# Patient Record
Sex: Male | Born: 1935 | Race: White | Hispanic: No | Marital: Married | Smoking: Never smoker
Health system: Southern US, Community
[De-identification: ages and names within clinical notes are randomized; demographics above are authoritative.]

## PROBLEM LIST (undated history)

## (undated) DIAGNOSIS — I3139 Other pericardial effusion (noninflammatory): Secondary | ICD-10-CM

## (undated) DIAGNOSIS — I313 Pericardial effusion (noninflammatory): Secondary | ICD-10-CM

## (undated) DIAGNOSIS — J45909 Unspecified asthma, uncomplicated: Secondary | ICD-10-CM

## (undated) DIAGNOSIS — R188 Other ascites: Secondary | ICD-10-CM

## (undated) DIAGNOSIS — I5032 Chronic diastolic (congestive) heart failure: Secondary | ICD-10-CM

## (undated) DIAGNOSIS — G2581 Restless legs syndrome: Secondary | ICD-10-CM

## (undated) DIAGNOSIS — G4733 Obstructive sleep apnea (adult) (pediatric): Secondary | ICD-10-CM

## (undated) DIAGNOSIS — E785 Hyperlipidemia, unspecified: Secondary | ICD-10-CM

## (undated) DIAGNOSIS — K7469 Other cirrhosis of liver: Secondary | ICD-10-CM

## (undated) DIAGNOSIS — N400 Enlarged prostate without lower urinary tract symptoms: Secondary | ICD-10-CM

## (undated) DIAGNOSIS — K7460 Unspecified cirrhosis of liver: Secondary | ICD-10-CM

## (undated) DIAGNOSIS — R634 Abnormal weight loss: Secondary | ICD-10-CM

## (undated) DIAGNOSIS — I509 Heart failure, unspecified: Secondary | ICD-10-CM

## (undated) DIAGNOSIS — R0600 Dyspnea, unspecified: Secondary | ICD-10-CM

## (undated) HISTORY — DX: Other cirrhosis of liver: K74.69

## (undated) HISTORY — DX: Heart failure, unspecified: I50.9

## (undated) HISTORY — PX: ELBOW SURGERY: SHX618

## (undated) HISTORY — PX: CATARACT EXTRACTION W/ INTRAOCULAR LENS  IMPLANT, BILATERAL: SHX1307

## (undated) HISTORY — PX: EYE SURGERY: SHX253

## (undated) HISTORY — DX: Unspecified cirrhosis of liver: K74.60

## (undated) HISTORY — DX: Obstructive sleep apnea (adult) (pediatric): G47.33

## (undated) HISTORY — PX: PARACENTESIS: SHX844

## (undated) HISTORY — DX: Benign prostatic hyperplasia without lower urinary tract symptoms: N40.0

## (undated) HISTORY — PX: NECK SURGERY: SHX720

## (undated) HISTORY — PX: BACK SURGERY: SHX140

---

## 1999-01-21 HISTORY — PX: BACK SURGERY: SHX140

## 2010-11-29 ENCOUNTER — Ambulatory Visit: Payer: Self-pay | Admitting: Ophthalmology

## 2010-12-09 ENCOUNTER — Ambulatory Visit: Payer: Self-pay | Admitting: Ophthalmology

## 2011-01-09 ENCOUNTER — Ambulatory Visit: Payer: Self-pay | Admitting: Unknown Physician Specialty

## 2011-01-10 LAB — PATHOLOGY REPORT

## 2011-05-07 ENCOUNTER — Institutional Professional Consult (permissible substitution): Payer: Self-pay | Admitting: Pulmonary Disease

## 2011-06-02 ENCOUNTER — Encounter: Payer: Self-pay | Admitting: Pulmonary Disease

## 2011-06-02 ENCOUNTER — Ambulatory Visit (INDEPENDENT_AMBULATORY_CARE_PROVIDER_SITE_OTHER): Payer: Medicare Other | Admitting: Pulmonary Disease

## 2011-06-02 VITALS — BP 142/60 | HR 64 | Temp 98.1°F | Ht 68.0 in | Wt 213.8 lb

## 2011-06-02 DIAGNOSIS — G4733 Obstructive sleep apnea (adult) (pediatric): Secondary | ICD-10-CM | POA: Insufficient documentation

## 2011-06-02 DIAGNOSIS — G4761 Periodic limb movement disorder: Secondary | ICD-10-CM

## 2011-06-02 MED ORDER — ASPIRIN 81 MG PO TABS: 81.0000 mg | ORAL_TABLET | Freq: Every day | ORAL | Status: AC

## 2011-06-02 NOTE — Patient Instructions (Signed)
Will get copy of CPAP report from [facility] and call you with results Will get copy of sleep studies done in [place] Follow up in one year

## 2011-06-02 NOTE — Progress Notes (Signed)
Chief Complaint  Patient presents with  . sleep consult    Pt states he has been using cpap for a few years now. He wears his cpap everynight. denies any problems. Pt is changing from Dr. Leane Platt office bc he had a hard time getting supplies    History of Present Illness: Gordon Cameron is a 77 y.o. male for evaluation of OSA.  He was diagnosed with sleep apnea several years ago when he was living in [place].  He was started on CPAP, and was doing well.  He was also told he had restless leg syndrome after being found to have increased periodic limb movements during his original sleep study.  He was started on klonopin for this.    He since moved to [place].  He established pulmonary care with Dr. Raul Del in [place], and was set up with [facility] for his DME.  He was having trouble arranging timely follow up, and as a result decided to arrange for alternative pulmonary follow up.  His CPAP is about 75 years old.  He uses a full face mask, but also has a nasal mask.  His CPAP is set at 11 cm H2O.  He does not have any trouble with his mask.  He uses his machine every night.  He feels this helps his sleep and energy.  He does not snore when using his CPAP.  He gradually weaned himself off klonopin.  His last dose was about 3 weeks ago.  He was not able to get his prescription refilled, and therefore decided to determine how he would do w/o the medicine.  He does not feel like he has leg symptoms that cause any trouble with his sleep.  He goes to bed at 1130 pm.  He falls asleep quickly.  He wakes up once or twice during the night.  He gets out of bed at 630 am.  He feels rested in the morning.  He will occasionally nap after work for about 20 to 30 minutes.  He is not using anything to help him sleep or stay awake.  His Epworth score is 3 out 24.  The patient denies sleep walking, sleep talking, bruxism, or nightmares.  The patient denies sleep hallucinations, sleep paralysis, or  cataplexy.   Past Medical History  Diagnosis Date  . OSA (obstructive sleep apnea)     Past Surgical History  Procedure Date  . Neck surgery     No current outpatient prescriptions on file prior to visit.    Not on File  family history includes Colon cancer in his brothers and maternal grandmother.   reports that he has never smoked. He does not have any smokeless tobacco history on file. He reports that he does not drink alcohol or use illicit drugs.  Review of Systems  Constitutional: Positive for unexpected weight change. Negative for appetite change.  HENT: Positive for hearing loss. Negative for congestion, sore throat, sneezing, trouble swallowing and dental problem.   Respiratory: Negative for cough and shortness of breath.   Cardiovascular: Negative for chest pain, palpitations and leg swelling.  Gastrointestinal: Negative for abdominal pain.  Musculoskeletal: Negative for joint swelling.  Skin: Negative for rash.  Neurological: Negative for headaches.  Psychiatric/Behavioral: Negative for dysphoric mood. The patient is not nervous/anxious.     Physical Exam: BP 142/60  Pulse 64  Temp(Src) 98.1 F (36.7 C) (Oral)  Ht 5' 8"  (1.727 m)  Wt 213 lb 12.8 oz (96.979 kg)  BMI 32.51 kg/m2  SpO2 97%  Body mass index is 32.51 kg/(m^2).  General - Obese HEENT - Wears glasses, PERRLA, EOMI, no sinus tenderness, no oral exudate, no LAN Cardiac - s1s2 regular, no murmur Chest - no wheeze/rales Abdomen - soft, nontender Extremities - no e/c/c Neurologic - normal strength, CN intact Skin - no rashes Psychiatric - normal mood, behavior  Assessment/Plan:  Outpatient Encounter Prescriptions as of 06/02/2011  Medication Sig Dispense Refill  . finasteride (PROSCAR) 5 MG tablet Once a day        Gordon Cameron Pager:  [phone] 06/02/2011, 11:29 AM

## 2011-06-02 NOTE — Progress Notes (Deleted)
  Subjective:    Patient ID: Gordon Cameron, male    DOB: 01-16-1935, 77 y.o.   MRN: [id number]  HPI    Review of Systems  Constitutional: Positive for unexpected weight change. Negative for appetite change.  HENT: Positive for hearing loss. Negative for congestion, sore throat, sneezing, trouble swallowing and dental problem.   Respiratory: Negative for cough and shortness of breath.   Cardiovascular: Negative for chest pain, palpitations and leg swelling.  Gastrointestinal: Negative for abdominal pain.  Musculoskeletal: Negative for joint swelling.  Skin: Negative for rash.  Neurological: Negative for headaches.  Psychiatric/Behavioral: Negative for dysphoric mood. The patient is not nervous/anxious.        Objective:   Physical Exam        Assessment & Plan:

## 2011-06-03 DIAGNOSIS — G4761 Periodic limb movement disorder: Secondary | ICD-10-CM | POA: Insufficient documentation

## 2011-06-03 NOTE — Assessment & Plan Note (Signed)
He has history of sleep apnea.  He is doing well with CPAP 11 cm H2O.  He has trouble with his DME, and may need to change.  I explained that he may run into difficulty with new DME set up since his previous sleep study is several years old.  As a result he may need a new sleep study.  I will get a copy of his current CPAP download, and call him with results to determine if he needs any adjustments to his CPAP setting.  Will also contact his sleep center form [place] to get a copy of his sleep studies.  I have explained how sleep apnea can affect the patient's health.  Driving precautions and importance of weight loss were discussed.  Treatment options for sleep apnea were reviewed.

## 2011-06-03 NOTE — Assessment & Plan Note (Signed)
He reports having increased leg movements during his first sleep study.  He denies symptoms of restless leg syndrome.  He has not noticed any worsening of his sleep pattern since stopping klonopin.  Will monitor him clinically, but do not think he needs therapy for his PLMS at this point.

## 2011-06-04 ENCOUNTER — Encounter: Payer: Self-pay | Admitting: Pulmonary Disease

## 2011-06-18 ENCOUNTER — Telehealth: Payer: Self-pay | Admitting: Pulmonary Disease

## 2011-06-18 NOTE — Telephone Encounter (Signed)
I spoke with mandy from [facility] and she stated pt's cpap machine is non downloadable bc he has an old machine. He is due for a new one in June. They will be sending over a CMN on pt for a new machine and did not need order sent. They are going to go ahead and set pt up with a new cpap machine and will send VS a download over as soon as they can. Leafy Ro was calling to inform VS of this as an FYI. Will forward to him to make him aware of this.

## 2011-06-19 NOTE — Telephone Encounter (Signed)
Noted  

## 2011-08-04 ENCOUNTER — Telehealth: Payer: Self-pay | Admitting: Pulmonary Disease

## 2011-08-04 DIAGNOSIS — G4733 Obstructive sleep apnea (adult) (pediatric): Secondary | ICD-10-CM

## 2011-08-04 NOTE — Telephone Encounter (Signed)
[email or web address]  

## 2011-08-04 NOTE — Telephone Encounter (Signed)
Spoke with pt. He wants to know if VS ever received his sleep study and also if we will go ahead and order new CPAP machine since he is now due for new machine. He also wanted VS to know that the machine he has now is unable to do a download so this can not be done until he gets a new machine. Please advise, thanks!

## 2011-08-04 NOTE — Telephone Encounter (Signed)
Returning call.

## 2011-08-04 NOTE — Telephone Encounter (Signed)
I received CPAP titration from 2008.    Per phone note from 06/18/11 he was supposed to get a new CPAP machine from [facility], and then download would be sent after he got his new machine.  I am not sure if he actually got a new machine from [facility], and I have not received CPAP download.  Can you please check with [facility] about these issues.

## 2011-08-05 NOTE — Telephone Encounter (Signed)
Called [facility], spoke with Synetta Fail who states in May pt was not eligible for a new machine per medicare guidelines.  Pt is now eligible for a new machine.  Synetta Fail is requesting specifics for the cpap download - once she receives this information, she will be able to have pt set up with a new machine.  Dr. Craige Cotta, pls advise. Thank you.

## 2011-08-06 NOTE — Telephone Encounter (Signed)
He needs CPAP 11 cm H2O with heated humidity and mask of choice.

## 2011-08-06 NOTE — Telephone Encounter (Signed)
Called spoke with patient's wife, informed her that Crystal had spoken with [facility] and why he did not receive his new CPAP machine in May but that we can and will go ahead and place the order so that he may have one now.  Pt's wife verbalized her understanding and denied any questions.  Order sent to [facility].

## 2011-09-12 ENCOUNTER — Encounter: Payer: Self-pay | Admitting: Pulmonary Disease

## 2011-09-12 ENCOUNTER — Telehealth: Payer: Self-pay | Admitting: Pulmonary Disease

## 2011-09-12 NOTE — Telephone Encounter (Signed)
I spoke with patient about results and he verbalized understanding and had no questions 

## 2011-09-12 NOTE — Telephone Encounter (Signed)
CPAP 08/11/11 to 09/09/11>>Used on 23 of 30 nights with average 7 hrs 10 min.  Average AHI 3.3 with CPAP 11 cm H2O.  Will have my nurse inform patient that CPAP report looked good.  No change to current CPAP set up.

## 2011-10-01 ENCOUNTER — Encounter: Payer: Self-pay | Admitting: Pulmonary Disease

## 2011-10-01 ENCOUNTER — Ambulatory Visit (INDEPENDENT_AMBULATORY_CARE_PROVIDER_SITE_OTHER): Payer: Medicare Other | Admitting: Pulmonary Disease

## 2011-10-01 VITALS — BP 118/70 | HR 68 | Temp 98.0°F | Ht 68.0 in | Wt 205.2 lb

## 2011-10-01 DIAGNOSIS — G4733 Obstructive sleep apnea (adult) (pediatric): Secondary | ICD-10-CM

## 2011-10-01 NOTE — Assessment & Plan Note (Signed)
He reports compliance with CPAP and benefit from therapy.  This is confirmed on recent CPAP download.  He is to continue CPAP 11 cm H2O.

## 2011-10-01 NOTE — Progress Notes (Signed)
Chief Complaint  Patient presents with  . Follow-up    wears cpap everynight x 7-8 hrs a night. denies any problems w/ mask/machine. sleeping fine at night and feels rested during the day    History of Present Illness: Gordon Cameron is a 77 y.o. male with OSA on CPAP 11 cm H2O.  He feels better since getting new mask and machine.  His mask shifts occasionally during the night, but this is not much of a problem.  He has not noticed his legs causing much trouble while asleep.  CPAP 08/11/11 to 09/09/11>>Used on 23 of 30 nights with average 7 hrs 10 min. Average AHI 3.3 with CPAP 11 cm H2O.  Past Medical History  Diagnosis Date  . OSA (obstructive sleep apnea)     Past Surgical History  Procedure Date  . Neck surgery     Outpatient Encounter Prescriptions as of 10/01/2011  Medication Sig Dispense Refill  . aspirin 81 MG tablet Take 1 tablet (81 mg total) by mouth daily.  30 tablet    . finasteride (PROSCAR) 5 MG tablet Once a day        No Known Allergies  Physical Exam:  Filed Vitals:   10/01/11 1555 10/01/11 1556  BP:  118/70  Pulse:  68  Temp: 98 F (36.7 C)   TempSrc: Oral   Height: 5' 8"  (1.727 m)   Weight: 205 lb 3.2 oz (93.078 kg)   SpO2:  95%    Current Encounter SPO2  10/01/11 1556 95%  06/02/11 1116 97%     Body mass index is 31.20 kg/(m^2). Wt Readings from Last 2 Encounters:  10/01/11 205 lb 3.2 oz (93.078 kg)  06/02/11 213 lb 12.8 oz (96.979 kg)    General - No distress ENT - No sinus tenderness, no oral exudate, no LAN Cardiac - s1s2 regular, no murmur, pulses symmetric, no edema Chest - normal respiratory excursion, good air entry, no wheeze/rales/dullness Back - no focal tenderness Abd - soft, non-tender, + bowel sounds Ext - normal motor strength Neuro - Cranial nerves are normal. PERLA. EOM's intact. Skin - no rashes Psych - normal mood, and behavior.   Assessment/Plan:  Gordon Mires, MD  Pulmonary/Critical  Care/Sleep Pager:  [phone] 10/01/2011, 4:01 PM

## 2011-10-01 NOTE — Patient Instructions (Signed)
Follow-up in one year.

## 2012-03-02 ENCOUNTER — Ambulatory Visit: Payer: Self-pay | Admitting: Ophthalmology

## 2012-03-16 ENCOUNTER — Ambulatory Visit: Payer: Self-pay | Admitting: Ophthalmology

## 2012-10-29 ENCOUNTER — Ambulatory Visit: Payer: Medicare Other | Admitting: Pulmonary Disease

## 2012-12-13 ENCOUNTER — Encounter: Payer: Self-pay | Admitting: Pulmonary Disease

## 2012-12-13 ENCOUNTER — Ambulatory Visit (INDEPENDENT_AMBULATORY_CARE_PROVIDER_SITE_OTHER): Payer: Medicare Other | Admitting: Pulmonary Disease

## 2012-12-13 VITALS — BP 120/72 | HR 72 | Ht 68.0 in | Wt 206.0 lb

## 2012-12-13 DIAGNOSIS — G4733 Obstructive sleep apnea (adult) (pediatric): Secondary | ICD-10-CM

## 2012-12-13 NOTE — Patient Instructions (Signed)
Will get report from CPAP machine and call with results Follow up in 1 year

## 2012-12-13 NOTE — Progress Notes (Signed)
Chief Complaint  Patient presents with  . Sleep Apnea    Currently using CPAP every night. Is in need of a new mask. Denies problems with machine or pressure.    History of Present Illness: Gordon Cameron is a 78 y.o. male with OSA on CPAP 11 cm H2O.  He has notice trouble with his mask.  He has not received a new mask for almost 1 year.  He uses his CPAP every night, and feels the pressure setting is adequate.  He gets about 7 hours sleep per night, and feels rested during the day.  He was started on medication for BPH, and has noticed needing to use the bathroom more at night.   TESTS: CPAP titration 06/17/06 ([facility]) >> CPAP 15 cm H2O. CPAP 08/11/11 to 09/09/11 >> Used on 23 of 30 nights with average 7 hrs 10 min.  Average AHI 3.3 with CPAP 11 cm H2O.   Gordon Cameron  has a past medical history of OSA (obstructive sleep apnea) and BPH (benign prostatic hyperplasia).  Gordon Cameron  has past surgical history that includes Neck surgery.  Prior to Admission medications   Medication Sig Start Date End Date Taking? Authorizing Provider  aspirin 81 MG tablet Take 81 mg by mouth daily.   Yes Historical Provider, MD  finasteride (PROSCAR) 5 MG tablet Once a day 05/25/11  Yes Historical Provider, MD  fluticasone (FLONASE) 50 MCG/ACT nasal spray Place 2 sprays into both nostrils daily.   Yes Historical Provider, MD    No Known Allergies   Physical Exam:  General - No distress ENT - No sinus tenderness, MP 3, scalloped tongue, 2+ tonsils, no oral exudate, no LAN Cardiac - s1s2 regular, no murmur Chest - No wheeze/rales/dullness Back - No focal tenderness Abd - Soft, non-tender Ext - No edema Neuro - Normal strength Skin - No rashes Psych - normal mood, and behavior   Assessment/Plan:  [name], MD [place] Pulmonary/Critical Care/Sleep Pager:  [phone]

## 2012-12-13 NOTE — Assessment & Plan Note (Signed)
He reports compliance with therapy and benefit from CPAP.  Will arrange for new CPAP mask refitting.  Will also check his CPAP download >> advised that nocturia can be associated with progression of sleep apnea.

## 2013-01-18 ENCOUNTER — Telehealth: Payer: Self-pay | Admitting: Pulmonary Disease

## 2013-01-18 NOTE — Telephone Encounter (Signed)
CPAP 11/22/12 to 12/21/12 >> used on 29 of 30 nights with average 6 hrs 36 min.  Average AHI 4.3 with CPAP 11 cm H2O.  Will have my nurse inform pt that CPAP report looks good.  No change to current set up needed.

## 2013-01-19 NOTE — Telephone Encounter (Signed)
Pt is aware of CPAP download results.

## 2014-01-03 ENCOUNTER — Encounter: Payer: Self-pay | Admitting: Pulmonary Disease

## 2014-01-03 ENCOUNTER — Ambulatory Visit (INDEPENDENT_AMBULATORY_CARE_PROVIDER_SITE_OTHER): Payer: Medicare Other | Admitting: Pulmonary Disease

## 2014-01-03 VITALS — BP 118/80 | HR 72 | Ht 68.0 in | Wt 202.0 lb

## 2014-01-03 DIAGNOSIS — G4733 Obstructive sleep apnea (adult) (pediatric): Secondary | ICD-10-CM

## 2014-01-03 NOTE — Patient Instructions (Signed)
Will get copy of CPAP report Follow up in one year

## 2014-01-03 NOTE — Progress Notes (Signed)
Chief Complaint  Patient presents with  . Follow-up    patient wears cpap every night. He is not having any problems.    History of Present Illness: Gordon Cameron is a 79 y.o. male with OSA on CPAP 11 cm H2O.   He has been doing well with CPAP.  He goes to bed at 1130 and wakes up at 630.  He feels rested, but still has to nap sometimes.  He took NSAIDs for tendinitis >> after this he has been sleeping better, and not waking up to use bathroom as much.  He has full face mask and this fits well.  He gets stuffy nose, and has more trouble using nasal mask.  He has noticed problems with opening his jaw.  TESTS: CPAP titration 06/17/06 ([facility]) >> CPAP 15 cm H2O. CPAP 08/11/11 to 09/09/11 >> Used on 23 of 30 nights with average 7 hrs 10 min.  Average AHI 3.3 with CPAP 11 cm H2O. CPAP 11/22/12 to 12/21/12 >> used on 29 of 30 nights with average 6 hrs 36 min. Average AHI 4.3 with CPAP 11 cm H2O.  PMHx >> BPH  PSHx, Medications, Allergies, Fhx, Shx reviewed.   Physical Exam:  General - No distress ENT - No sinus tenderness, MP 3, scalloped tongue, 2+ tonsils, no oral exudate, no LAN Cardiac - s1s2 regular, no murmur Chest - No wheeze/rales/dullness Back - No focal tenderness Abd - Soft, non-tender Ext - No edema Neuro - Normal strength Skin - No rashes Psych - normal mood, and behavior   Assessment/Plan:  Obstructive sleep apnea >> he is compliant with CPAP and reports benefit from therapy. Plan: - will get his download and call him with results - continue CPAP 11 cm H2O  TMJ. Plan: - advised him to d/w his dentist   [name], MD [place] Pulmonary/Critical Care/Sleep Pager:  [phone]

## 2014-05-12 NOTE — Op Note (Signed)
PATIENT NAME:  Gordon Cameron, Gordon Cameron MR#:  [id number] DATE OF BIRTH:  [birth year]  DATE OF PROCEDURE:  03/16/2012  PREOPERATIVE DIAGNOSIS: Visually significant cataract of the right eye.   POSTOPERATIVE DIAGNOSIS: Visually significant cataract of the right eye.   OPERATIVE PROCEDURE: Cataract extraction by phacoemulsification with implant of intraocular lens to right eye.   SURGEON: Birder Robson, MD.   ANESTHESIA:  1. Managed anesthesia care.  2. Topical tetracaine drops followed by 2% Xylocaine jelly applied in the preoperative holding area.   COMPLICATIONS: None.   TECHNIQUE:  Four-quadrant divide-and-conquer.  DESCRIPTION OF PROCEDURE: The patient was examined and consented in the preoperative holding area where the aforementioned topical anesthesia was applied to the right eye and then brought back to the operating room where the right eye was prepped and draped in the usual sterile ophthalmic fashion and a lid speculum was placed. A paracentesis was created with the side port blade and the anterior chamber was filled with viscoelastic. A near-clear corneal incision was performed with the steel keratome. A continuous curvilinear capsulorrhexis was performed with a cystotome followed by the capsulorrhexis forceps. Hydrodissection and hydrodelineation were carried out with BSS on a blunt cannula. The lens was removed in a four-quadrant divide-and-conquer technique and the remaining cortical material was removed with the irrigation-aspiration handpiece. The capsular bag was inflated with viscoelastic and the Tecnis ZCB00 20.0-diopter lens, serial number [id number] was placed in the capsular bag without complication. The remaining viscoelastic was removed from the eye with the irrigation-aspiration handpiece. The wounds were hydrated. The anterior chamber was flushed with Miostat and the eye was inflated to physiologic pressure. 0.1 mL of cefuroxime concentration 10 mg/mL was placed in the anterior  chamber. The wounds were found to be water tight. The eye was dressed with Vigamox. The patient was given protective glasses to wear throughout the day and a shield with which to sleep tonight. The patient was also given drops with which to begin a drop regimen today and will follow up with me in one day.      ____________________________ Livingston Diones. Reshad Saab, MD wlp:dm D: 03/16/2012 14:04:00 ET T: 03/16/2012 15:18:40 ET JOB#: [id number]  cc: Cara Thaxton L. Artesia Berkey, MD, <Dictator> Livingston Diones Taleen Prosser MD ELECTRONICALLY SIGNED 04/02/2012 17:10

## 2015-05-03 ENCOUNTER — Ambulatory Visit (INDEPENDENT_AMBULATORY_CARE_PROVIDER_SITE_OTHER): Payer: Medicare Other | Admitting: Pulmonary Disease

## 2015-05-03 ENCOUNTER — Encounter: Payer: Self-pay | Admitting: Pulmonary Disease

## 2015-05-03 VITALS — BP 136/62 | HR 68 | Ht 68.0 in | Wt 207.4 lb

## 2015-05-03 DIAGNOSIS — G4733 Obstructive sleep apnea (adult) (pediatric): Secondary | ICD-10-CM | POA: Diagnosis not present

## 2015-05-03 DIAGNOSIS — Z9989 Dependence on other enabling machines and devices: Principal | ICD-10-CM

## 2015-05-03 NOTE — Progress Notes (Signed)
Current Outpatient Prescriptions on File Prior to Visit  Medication Sig  . aspirin 81 MG tablet Take 81 mg by mouth daily.  . finasteride (PROSCAR) 5 MG tablet Once a day  . fluticasone (FLONASE) 50 MCG/ACT nasal spray Place 2 sprays into both nostrils daily.   No current facility-administered medications on file prior to visit.     Chief Complaint  Patient presents with  . Follow-up    Wears CPAP nightly. Denies any issues with pressure. Mask is leaking and feels he needs a replacement. Requesting a replacement machine also. Current Mask-nasal mask. Having issues with DME; not pleased.  DME: [facility]     Tests CPAP titration 06/17/06 ([facility]) >> CPAP 15 cm H2O. CPAP 08/11/11 to 09/09/11 >> Used on 23 of 30 nights with average 7 hrs 10 min. Average AHI 3.3 with CPAP 11 cm H2O. CPAP 11/22/12 to 12/21/12 >> used on 29 of 30 nights with average 6 hrs 36 min. Average AHI 4.3 with CPAP 11 cm H2O.  Past medical hx BPH  Past surgical hx, Allergies, Family hx, Social hx all reviewed.  Vital Signs BP 136/62 mmHg  Pulse 68  Ht 5' 8"  (1.727 m)  Wt 207 lb 6.4 oz (94.076 kg)  BMI 31.54 kg/m2  SpO2 95%  History of Present Illness Gordon Cameron is a 81 y.o. male with OSA.  His mask is old >> he has not received an new mask for a while.  He thinks his machine is more than 79 yrs old.  He uses nasal mask mostly, but full face when he has sinus congestion.  He gets about 7 hrs sleep per night.   Physical Exam  General - No distress ENT - No sinus tenderness, no oral exudate, no LAN, MP 4 Cardiac - s1s2 regular, no murmur Chest - No wheeze/rales/dullness Back - No focal tenderness Abd - Soft, non-tender Ext - No edema Neuro - Normal strength Skin - No rashes Psych - normal mood, and behavior   Assessment/Plan  Obstructive sleep apnea. - will arrange for new supplies - will check with his DME about whether he is eligible for a new machine - will call him with  results of CPAP download   Patient Instructions  Will arrange for new CPAP machine and supplies Will call with results of CPAP download  Follow up in 1 year     [name], MD [place] Pulmonary/Critical Care/Sleep Pager:  [phone] 05/03/2015, 11:01 AM

## 2015-05-03 NOTE — Patient Instructions (Signed)
Will arrange for new CPAP machine and supplies Will call with results of CPAP download  Follow up in 1 year

## 2015-05-29 ENCOUNTER — Telehealth: Payer: Self-pay | Admitting: Pulmonary Disease

## 2015-05-29 ENCOUNTER — Telehealth: Payer: Self-pay | Admitting: Emergency Medicine

## 2015-05-29 NOTE — Telephone Encounter (Signed)
Patient states when he came in for his appointment, he brought his SD card and gave it to Dr. Halford Chessman and his nurse.  He said that they told him he would receive a copy of his download.  Patient has not received copy and wants to know if he can get a copy.  Do not see copy of download in scan folder.  Patient stated he would take SD card to [facility] next week and have them download the card and will send a copy to Dr. Halford Chessman.    Forwarding to [place] to follow up on download

## 2015-05-29 NOTE — Telephone Encounter (Signed)
Opened msg under wrong provider, closing.

## 2015-06-05 NOTE — Telephone Encounter (Signed)
Ashtyn please advise.  Thanks!

## 2015-06-08 NOTE — Telephone Encounter (Signed)
Download has not been received. Spoke with the pt who states that he took his SD card to [facility]. They were to fax this download to [place]. Called [facility] and spoke to [place]. They will fax over this download to [place]. Will route to [place] to ensure follow up.

## 2015-06-11 NOTE — Telephone Encounter (Signed)
Download still has not been received. Attempted to contact [facility]. No answer. Will try back.

## 2015-06-12 NOTE — Telephone Encounter (Signed)
Download still has not been received. Called [facility] and spoke with [place]. She is going to fax the download to [place] again. Will route to [place] to follow up.

## 2015-06-13 NOTE — Telephone Encounter (Signed)
Download has been received and placed in VS look at. Will route to VS to address when he returns to the office on Friday.

## 2015-06-13 NOTE — Telephone Encounter (Signed)
CPAP 05/06/15 to 06/04/15 >> used on 30 of 30 nights with average 7 hrs and 18 min.  Average AHI is 3 with CPAP 11 cm H2O.   Will have my nurse inform pt that CPAP report looks good.  No change to current set up needed.

## 2015-06-14 NOTE — Telephone Encounter (Signed)
Spoke with pt. He is aware of download results. Nothing further was needed.

## 2017-01-27 ENCOUNTER — Encounter: Payer: Self-pay | Admitting: *Deleted

## 2017-01-28 ENCOUNTER — Ambulatory Visit
Admission: RE | Admit: 2017-01-28 | Discharge: 2017-01-28 | Disposition: A | Discharge status: Home / Self Care | Payer: Medicare Other | Source: Ambulatory Visit | Attending: Unknown Physician Specialty | Admitting: Unknown Physician Specialty

## 2017-01-28 ENCOUNTER — Encounter
Admission: RE | Disposition: A | Discharge status: Home / Self Care | Payer: Self-pay | Source: Ambulatory Visit | Attending: Unknown Physician Specialty

## 2017-01-28 ENCOUNTER — Ambulatory Visit: Payer: Medicare Other | Admitting: Anesthesiology

## 2017-01-28 ENCOUNTER — Encounter: Payer: Self-pay | Admitting: *Deleted

## 2017-01-28 DIAGNOSIS — G4733 Obstructive sleep apnea (adult) (pediatric): Secondary | ICD-10-CM | POA: Diagnosis not present

## 2017-01-28 DIAGNOSIS — D122 Benign neoplasm of ascending colon: Secondary | ICD-10-CM | POA: Diagnosis not present

## 2017-01-28 DIAGNOSIS — D123 Benign neoplasm of transverse colon: Secondary | ICD-10-CM | POA: Diagnosis not present

## 2017-01-28 DIAGNOSIS — D125 Benign neoplasm of sigmoid colon: Secondary | ICD-10-CM | POA: Diagnosis not present

## 2017-01-28 DIAGNOSIS — Z7982 Long term (current) use of aspirin: Secondary | ICD-10-CM | POA: Diagnosis not present

## 2017-01-28 DIAGNOSIS — E785 Hyperlipidemia, unspecified: Secondary | ICD-10-CM | POA: Insufficient documentation

## 2017-01-28 DIAGNOSIS — Z79899 Other long term (current) drug therapy: Secondary | ICD-10-CM | POA: Insufficient documentation

## 2017-01-28 DIAGNOSIS — M1990 Unspecified osteoarthritis, unspecified site: Secondary | ICD-10-CM | POA: Diagnosis not present

## 2017-01-28 DIAGNOSIS — N400 Enlarged prostate without lower urinary tract symptoms: Secondary | ICD-10-CM | POA: Insufficient documentation

## 2017-01-28 DIAGNOSIS — G2581 Restless legs syndrome: Secondary | ICD-10-CM | POA: Insufficient documentation

## 2017-01-28 DIAGNOSIS — Z1211 Encounter for screening for malignant neoplasm of colon: Secondary | ICD-10-CM | POA: Insufficient documentation

## 2017-01-28 DIAGNOSIS — J449 Chronic obstructive pulmonary disease, unspecified: Secondary | ICD-10-CM | POA: Insufficient documentation

## 2017-01-28 HISTORY — PX: COLONOSCOPY WITH PROPOFOL: SHX5780

## 2017-01-28 HISTORY — DX: Hyperlipidemia, unspecified: E78.5

## 2017-01-28 HISTORY — DX: Restless legs syndrome: G25.81

## 2017-01-28 HISTORY — DX: Unspecified asthma, uncomplicated: J45.909

## 2017-01-28 SURGERY — COLONOSCOPY WITH PROPOFOL
Anesthesia: General

## 2017-01-28 MED ORDER — PROPOFOL 10 MG/ML IV BOLUS
INTRAVENOUS | Status: DC | PRN
  Administered 2017-01-28: 100 mg via INTRAVENOUS

## 2017-01-28 MED ORDER — FENTANYL CITRATE (PF) 100 MCG/2ML IJ SOLN
INTRAMUSCULAR | Status: AC
  Filled 2017-01-28: qty 2

## 2017-01-28 MED ORDER — PROPOFOL 500 MG/50ML IV EMUL
INTRAVENOUS | Status: DC | PRN
  Administered 2017-01-28: 140 ug/kg/min via INTRAVENOUS

## 2017-01-28 MED ORDER — PROPOFOL 10 MG/ML IV BOLUS
INTRAVENOUS | Status: AC
  Filled 2017-01-28: qty 20

## 2017-01-28 MED ORDER — SODIUM CHLORIDE 0.9 % IV SOLN
INTRAVENOUS | Status: DC
  Administered 2017-01-28 (×2): via INTRAVENOUS

## 2017-01-28 MED ORDER — SODIUM CHLORIDE 0.9 % IV SOLN: INTRAVENOUS | Status: DC

## 2017-01-28 MED ORDER — FENTANYL CITRATE (PF) 100 MCG/2ML IJ SOLN
INTRAMUSCULAR | Status: DC | PRN
  Administered 2017-01-28 (×2): 50 ug via INTRAVENOUS

## 2017-01-28 MED ORDER — LIDOCAINE 2% (20 MG/ML) 5 ML SYRINGE
INTRAMUSCULAR | Status: DC | PRN
  Administered 2017-01-28: 40 mg via INTRAVENOUS

## 2017-01-28 MED ORDER — PROPOFOL 500 MG/50ML IV EMUL
INTRAVENOUS | Status: AC
  Filled 2017-01-28: qty 50

## 2017-01-28 MED ORDER — LIDOCAINE HCL (PF) 2 % IJ SOLN
INTRAMUSCULAR | Status: AC
  Filled 2017-01-28: qty 10

## 2017-01-28 MED ORDER — PHENYLEPHRINE HCL 10 MG/ML IJ SOLN
INTRAMUSCULAR | Status: DC | PRN
  Administered 2017-01-28 (×2): 100 ug via INTRAVENOUS

## 2017-01-28 NOTE — H&P (Signed)
   Primary Care Physician:  Kirk Ruths, MD Primary Gastroenterologist:  Dr. Vira Agar  Pre-Procedure History & Physical: HPI:  Gordon Cameron is a 83 y.o. male is here for an colonoscopy.   Past Medical History:  Diagnosis Date  . BPH (benign prostatic hyperplasia)   . Hyperlipidemia   . OSA (obstructive sleep apnea)   . Reactive airway disease   . Restless leg     Past Surgical History:  Procedure Laterality Date  . BACK SURGERY     2001  . ELBOW SURGERY Right   . NECK SURGERY      Prior to Admission medications   Medication Sig Start Date End Date Taking? Authorizing Provider  aspirin 81 MG tablet Take 81 mg by mouth daily.   Yes [provider]  cetirizine (ZYRTEC) 10 MG tablet Take 10 mg by mouth daily.   Yes [provider]  finasteride (PROSCAR) 5 MG tablet Once a day 05/25/11  Yes [provider]  fluticasone (FLONASE) 50 MCG/ACT nasal spray Place 2 sprays into both nostrils daily.   Yes [provider]    Allergies as of 12/22/2016  . (No Known Allergies)    Family History  Problem Relation Age of Onset  . Colon cancer Maternal Grandmother   . Colon cancer Brother   . Colon cancer Brother     Social History   Socioeconomic History  . Marital status: Married    Spouse name: Not on file  . Number of children: Not on file  . Years of education: Not on file  . Highest education level: Not on file  Social Needs  . Financial resource strain: Not on file  . Food insecurity - worry: Not on file  . Food insecurity - inability: Not on file  . Transportation needs - medical: Not on file  . Transportation needs - non-medical: Not on file  Occupational History  . Occupation: retired  Tobacco Use  . Smoking status: Never Smoker  . Smokeless tobacco: Never Used  Substance and Sexual Activity  . Alcohol use: No    Alcohol/week: 0.0 oz  . Drug use: No  . Sexual activity: Not on file  Other Topics Concern  . Not on  file  Social History Narrative  . Not on file    Review of Systems: See HPI, otherwise negative ROS  Physical Exam: BP (!) 143/65   Pulse 65   Temp (!) 96.2 F (35.7 C) (Tympanic)   Resp 16   Ht 5' 8"  (1.727 m)   Wt 90.7 kg (200 lb)   SpO2 96%   BMI 30.41 kg/m  General:   Alert,  pleasant and cooperative in NAD Head:  Normocephalic and atraumatic. Neck:  Supple; no masses or thyromegaly. Lungs:  Clear throughout to auscultation.    Heart:  Regular rate and rhythm. Abdomen:  Soft, nontender and nondistended. Normal bowel sounds, without guarding, and without rebound.   Neurologic:  Alert and  oriented x4;  grossly normal neurologically.  Impression/Plan: [name] is here for an colonoscopy to be performed for FH colon cancer.  Risks, benefits, limitations, and alternatives regarding  colonoscopy have been reviewed with the patient.  Questions have been answered.  All parties agreeable.   Gaylyn Cheers, MD  01/28/2017, 1:19 PM

## 2017-01-28 NOTE — Op Note (Signed)
[facility] Gastroenterology Patient Name: Gordon Cameron Procedure Date: 01/28/2017 12:10 PM MRN: [id number] Account #: [id number] Date of Birth: Jul 31, 1934 Admit Type: Outpatient Age: 83 Room: [facility] ENDO ROOM 4 Gender: Male Note Status: Finalized Procedure:            Colonoscopy Indications:          Screening in patient at increased risk: Family history                        of 1st-degree relative with colorectal cancer Providers:            Manya Silvas, MD Referring MD:         Ocie Cornfield. Ouida Sills MD, MD (Referring MD) Medicines:            Propofol per Anesthesia Complications:        No immediate complications. Procedure:            Pre-Anesthesia Assessment:                       - After reviewing the risks and benefits, the patient                        was deemed in satisfactory condition to undergo the                        procedure.                       After obtaining informed consent, the colonoscope was                        passed under direct vision. Throughout the procedure,                        the patient's blood pressure, pulse, and oxygen                        saturations were monitored continuously. The                        Colonoscope was introduced through the anus and                        advanced to the the cecum, identified by appendiceal                        orifice and ileocecal valve. The colonoscopy was                        performed without difficulty. The patient tolerated the                        procedure well. The quality of the bowel preparation                        was excellent. Findings:      A small polyp was found in the ascending colon. The polyp was sessile.       The polyp was removed with a hot snare. Resection and retrieval were       complete. To prevent bleeding after the  polypectomy, one hemostatic clip       was successfully placed. There was no bleeding during, or at the end, of    the procedure.      A diminutive polyp was found in the transverse colon. The polyp was       sessile. The polyp was removed with a jumbo cold forceps. Resection and       retrieval were complete.      A diminutive polyp was found in the sigmoid colon. The polyp was       sessile. The polyp was removed with a jumbo cold forceps. Resection and       retrieval were complete.      A diminutive polyp was found in the sigmoid colon. The polyp was       sessile. The polyp was removed with a jumbo cold forceps. Resection and       retrieval were complete.      The exam was otherwise without abnormality. Impression:           - One small polyp in the ascending colon, removed with                        a hot snare. Resected and retrieved. Clip was placed.                       - One diminutive polyp in the transverse colon, removed                        with a jumbo cold forceps. Resected and retrieved.                       - One diminutive polyp in the sigmoid colon, removed                        with a jumbo cold forceps. Resected and retrieved.                       - One diminutive polyp in the sigmoid colon, removed                        with a jumbo cold forceps. Resected and retrieved.                       - The examination was otherwise normal. Recommendation:       - Await pathology results. Manya Silvas, MD 01/28/2017 1:54:40 PM This report has been signed electronically. Number of Addenda: 0 Note Initiated On: 01/28/2017 12:10 PM Scope Withdrawal Time: 0 hours 9 minutes 28 seconds  Total Procedure Duration: 0 hours 21 minutes 5 seconds       [facility]

## 2017-01-28 NOTE — Anesthesia Postprocedure Evaluation (Addendum)
Anesthesia Post Note  Patient: Gordon Cameron  Procedure(s) Performed: COLONOSCOPY WITH PROPOFOL (N/A )  Patient location during evaluation: Endoscopy Anesthesia Type: General Level of consciousness: awake and alert and oriented Pain management: pain level controlled Vital Signs Assessment: post-procedure vital signs reviewed and stable Respiratory status: spontaneous breathing, nonlabored ventilation and respiratory function stable Cardiovascular status: blood pressure returned to baseline and stable Postop Assessment: no signs of nausea or vomiting Anesthetic complications: no     Last Vitals:  Vitals:   01/28/17 1427 01/28/17 1437  BP: 118/72 115/70  Pulse: 69 66  Resp: (!) 25 14  Temp:    SpO2: 96% 98%    Last Pain:  Vitals:   01/28/17 1437  TempSrc:   PainSc: 0-No pain                 Dawson Albers

## 2017-01-28 NOTE — Anesthesia Post-op Follow-up Note (Signed)
Anesthesia QCDR form completed.        

## 2017-01-28 NOTE — Anesthesia Preprocedure Evaluation (Addendum)
Anesthesia Evaluation  Patient identified by MRN, date of birth, ID band Patient awake    Reviewed: Allergy & Precautions, NPO status , Patient's Chart, lab work & pertinent test results  Airway Mallampati: III  TM Distance: <3 FB     Dental  (+) Chipped, Teeth Intact   Pulmonary sleep apnea , COPD,    Pulmonary exam normal        Cardiovascular negative cardio ROS Normal cardiovascular exam     Neuro/Psych negative neurological ROS  negative psych ROS   GI/Hepatic negative GI ROS, Neg liver ROS,   Endo/Other  negative endocrine ROS  Renal/GU negative Renal ROS  negative genitourinary   Musculoskeletal  (+) Arthritis , Osteoarthritis,    Abdominal Normal abdominal exam  (+)   Peds negative pediatric ROS (+)  Hematology negative hematology ROS (+)   Anesthesia Other Findings   Reproductive/Obstetrics                            Anesthesia Physical Anesthesia Plan  ASA: III  Anesthesia Plan: General   Post-op Pain Management:    Induction: Intravenous  PONV Risk Score and Plan:   Airway Management Planned: Nasal Cannula  Additional Equipment:   Intra-op Plan:   Post-operative Plan:   Informed Consent: I have reviewed the patients History and Physical, chart, labs and discussed the procedure including the risks, benefits and alternatives for the proposed anesthesia with the patient or authorized representative who has indicated his/her understanding and acceptance.   Dental advisory given  Plan Discussed with: CRNA and Surgeon  Anesthesia Plan Comments:         Anesthesia Quick Evaluation

## 2017-01-28 NOTE — Transfer of Care (Signed)
Immediate Anesthesia Transfer of Care Note  Patient: Gordon Cameron  Procedure(s) Performed: COLONOSCOPY WITH PROPOFOL (N/A )  Patient Location: PACU and Endoscopy Unit  Anesthesia Type:General  Level of Consciousness: awake and drowsy  Airway & Oxygen Therapy: Patient Spontanous Breathing and Patient connected to nasal cannula oxygen  Post-op Assessment: Report given to RN and Post -op Vital signs reviewed and stable  Post vital signs: stable  Last Vitals:  Vitals:   01/28/17 1234 01/28/17 1357  BP: (!) 143/65 (!) 83/40  Pulse: 65 64  Resp: 16 16  Temp: (!) 35.7 C (!) 36.2 C  SpO2: 96% 94%    Last Pain:  Vitals:   01/28/17 1357  TempSrc:   PainSc: Asleep         Complications: No apparent anesthesia complications

## 2017-01-29 ENCOUNTER — Encounter: Payer: Self-pay | Admitting: Unknown Physician Specialty

## 2017-01-30 LAB — SURGICAL PATHOLOGY

## 2017-06-23 ENCOUNTER — Other Ambulatory Visit: Payer: Self-pay | Admitting: Internal Medicine

## 2017-06-23 ENCOUNTER — Ambulatory Visit
Admission: RE | Admit: 2017-06-23 | Discharge: 2017-06-23 | Disposition: A | Discharge status: Home / Self Care | Payer: Medicare Other | Source: Ambulatory Visit | Attending: Internal Medicine | Admitting: Internal Medicine

## 2017-06-23 ENCOUNTER — Other Ambulatory Visit
Admission: RE | Admit: 2017-06-23 | Discharge: 2017-06-23 | Disposition: A | Discharge status: Home / Self Care | Payer: Medicare Other | Source: Other Acute Inpatient Hospital | Attending: Internal Medicine | Admitting: Internal Medicine

## 2017-06-23 DIAGNOSIS — J9811 Atelectasis: Secondary | ICD-10-CM | POA: Diagnosis not present

## 2017-06-23 DIAGNOSIS — I700 Atherosclerosis of aorta: Secondary | ICD-10-CM | POA: Diagnosis not present

## 2017-06-23 DIAGNOSIS — I313 Pericardial effusion (noninflammatory): Secondary | ICD-10-CM | POA: Insufficient documentation

## 2017-06-23 DIAGNOSIS — R7989 Other specified abnormal findings of blood chemistry: Secondary | ICD-10-CM

## 2017-06-23 DIAGNOSIS — R079 Chest pain, unspecified: Secondary | ICD-10-CM | POA: Diagnosis present

## 2017-06-23 LAB — FIBRIN DERIVATIVES D-DIMER (ARMC ONLY): FIBRIN DERIVATIVES D-DIMER (ARMC): 3328.63 ng{FEU}/mL — AB (ref 0.00–499.00)

## 2017-06-23 LAB — TROPONIN I

## 2017-06-23 MED ORDER — IOPAMIDOL (ISOVUE-370) INJECTION 76%
75.0000 mL | Freq: Once | INTRAVENOUS | Status: AC | PRN
  Administered 2017-06-23: 75 mL via INTRAVENOUS

## 2017-06-24 LAB — POCT I-STAT CREATININE: CREATININE: 0.9 mg/dL (ref 0.61–1.24)

## 2017-06-28 ENCOUNTER — Encounter: Payer: Self-pay | Admitting: Pulmonary Disease

## 2017-06-29 ENCOUNTER — Encounter: Payer: Self-pay | Admitting: Adult Health

## 2017-06-29 ENCOUNTER — Ambulatory Visit (INDEPENDENT_AMBULATORY_CARE_PROVIDER_SITE_OTHER): Payer: Medicare Other | Admitting: Adult Health

## 2017-06-29 DIAGNOSIS — R0600 Dyspnea, unspecified: Secondary | ICD-10-CM | POA: Insufficient documentation

## 2017-06-29 DIAGNOSIS — R0609 Other forms of dyspnea: Secondary | ICD-10-CM | POA: Diagnosis not present

## 2017-06-29 DIAGNOSIS — G4733 Obstructive sleep apnea (adult) (pediatric): Secondary | ICD-10-CM | POA: Diagnosis not present

## 2017-06-29 LAB — NITRIC OXIDE: Nitric Oxide: 9

## 2017-06-29 NOTE — Progress Notes (Signed)
@Patient  ID: Gordon Cameron, male    DOB: Mar 30, 1934, 83 y.o.   MRN: [id number]  Chief Complaint  Patient presents with  . Acute Visit    SOB     Referring provider: Kirk Ruths, MD  HPI: 83 year old male smoker followed for obstructive sleep apnea  Tests CPAP titration 06/17/06 ([facility]) >> CPAP 15 cm H2O. CPAP 08/11/11 to 09/09/11 >> Used on 23 of 30 nights with average 7 hrs 10 min. Average AHI 3.3 with CPAP 11 cm H2O. CPAP 11/22/12 to 12/21/12 >> used on 29 of 30 nights with average 6 hrs 36 min. Average AHI 4.3 with CPAP 11 cm H2O.  06/29/2017 Acute OV : SOB  Patient presents for an acute office visit.  Patient complains of increased shortness of breath that is progressively worsened with activity over the last 6 weeks.  Care everywhere notes show that he was set up for a CT chest that was negative for PE.  Showed clear lungs except for minimal bibasilar atelectasis.  There was a moderate pericardial effusion.  He was set up for a myocardial stress test that showed no evidence of ischemia.  With normal left ventricular function.  2D echo showed EF 50%, mild LVH.  And a mild pericardial effusion.  CBC showed hemoglobin at 12.8., nml TSH  Patient is followed in our office for obstructive sleep apnea on CPAP.  He was last seen in our office April 2017. Says he has been having more trouble with breathing over last 2 months . Feels very sob with activity , low energy . Some dry cough and sinus congestion and drainage.  Is usually very active but over last 4-6 weeks has no energy at all. Gets winded with minimal activity .  Walk test in the office shows no desats on room air , O2 sats 95% .  Exhaled nitric oxide test today is 9 ppb.   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 No pet or unusal hobbies. No travel.   Pt is on CPAP At bedtime. Says he is doing well on CPAP . Wears it each night for 7 hr . does not feel as rested.  Weight is up 10lbs.  Download shows excellent compliance  with avg usage 7 hr . AHI 15. Mask is leaking .  Needs new supplies .     No Known Allergies  Immunization History  Administered Date(s) Administered  . Influenza Split 10/18/2012  . Influenza, High Dose Seasonal PF 11/20/2016  . Influenza-Unspecified 12/04/2013    Past Medical History:  Diagnosis Date  . BPH (benign prostatic hyperplasia)   . Hyperlipidemia   . OSA (obstructive sleep apnea)   . Reactive airway disease   . Restless leg     Tobacco History: Social History   Tobacco Use  Smoking Status Never Smoker  Smokeless Tobacco Never Used   Counseling given: Not Answered   Outpatient Encounter Medications as of 06/29/2017  Medication Sig  . aspirin 81 MG tablet Take 81 mg by mouth daily.  . cetirizine (ZYRTEC) 10 MG tablet Take 10 mg by mouth daily as needed.   . finasteride (PROSCAR) 5 MG tablet Once a day  . fluticasone (FLONASE) 50 MCG/ACT nasal spray Place 2 sprays into both nostrils daily.  . metoprolol succinate (TOPROL-XL) 50 MG 24 hr tablet Take 50 mg by mouth daily. Take with or immediately following a meal.  . omeprazole (PRILOSEC) 20 MG capsule Take 20 mg by mouth daily.   No facility-administered encounter medications  on file as of 06/29/2017.      Review of Systems  Constitutional:   No  weight loss, night sweats,  Fevers, chills,  +fatigue, or  lassitude.  HEENT:   No headaches,  Difficulty swallowing,  Tooth/dental problems, or  Sore throat,                No sneezing, itching, ear ache,  +nasal congestion, post nasal drip,   CV:  No chest pain,  Orthopnea, PND,  anasarca, dizziness, palpitations, syncope.   GI  No heartburn, indigestion, abdominal pain, nausea, vomiting, diarrhea, change in bowel habits, loss of appetite, bloody stools.   Resp:  No chest wall deformity  Skin: no rash or lesions.  GU: no dysuria, change in color of urine, no urgency or frequency.  No flank pain, no hematuria   MS:  No joint pain or swelling.  No  decreased range of motion.  No back pain.    Physical Exam  BP 114/72 (BP Location: Left Arm, Cuff Size: Normal)   Pulse 94   Temp (!) 97.5 F (36.4 C) (Oral)   Ht 5\' 8"  (1.727 m)   Wt 217 lb 6.4 oz (98.6 kg)   SpO2 97%   BMI 33.06 kg/m   GEN: A/Ox3; pleasant , NAD, obese    HEENT:  [place]/AT,  EACs-partial wax impaction  NOSE-clear, THROAT-clear, no lesions, no postnasal drip or exudate noted.   NECK:  Supple w/ fair ROM; no JVD; normal carotid impulses w/o bruits; no thyromegaly or nodules palpated; no lymphadenopathy.    RESP  Decreased BS in bases  . no accessory muscle use, no dullness to percussion  CARD:  RRR, no m/r/g, tr -1 peripheral edema, pulses intact, no cyanosis or clubbing.  GI:   Soft & nt; nml bowel sounds; no organomegaly or masses detected.   Musco: Warm bil, no deformities or joint swelling noted.   Neuro: alert, no focal deficits noted.    Skin: Warm, no lesions or rashes    Lab Results:  CBC No results found for: WBC, RBC, HGB, HCT, PLT, MCV, MCH, MCHC, RDW, LYMPHSABS, MONOABS, EOSABS, BASOSABS  BMET    Component Value Date/Time   CREATININE 0.90 06/23/2017 1519    BNP No results found for: BNP  ProBNP No results found for: PROBNP  Imaging: Ct Angio Chest Pe W Or Wo Contrast  Result Date: 06/23/2017 CLINICAL DATA:  Weakness and chest discomfort, shortness of breath for weeks, elevated D-dimer. EXAM: CT ANGIOGRAPHY CHEST WITH CONTRAST TECHNIQUE: Multidetector CT imaging of the chest was performed using the standard protocol during bolus administration of intravenous contrast. Multiplanar CT image reconstructions and MIPs were obtained to evaluate the vascular anatomy. CONTRAST:  51mL ISOVUE-370 IOPAMIDOL (ISOVUE-370) INJECTION 76% COMPARISON:  None. FINDINGS: Cardiovascular: There is a pericardial effusion, moderate in size, measuring approximately 1.1 cm greatest thickness. Heart size is normal. No thoracic aortic aneurysm or evidence of  aortic dissection. Mild aortic atherosclerosis. No pulmonary embolism identified within the main, lobar or segmental pulmonary arteries bilaterally. Mediastinum/Nodes: No mass or enlarged lymph nodes seen within the mediastinum or perihilar regions. Esophagus appears normal. Trachea and central bronchi are unremarkable. Lungs/Pleura: Mild bibasilar atelectasis. Lungs otherwise clear. No pleural effusion or pneumothorax. Upper Abdomen: Limited images of the upper abdomen are unremarkable. Musculoskeletal: No acute or suspicious osseous finding. Review of the MIP images confirms the above findings. IMPRESSION: 1. Pericardial effusion, moderate in size measuring approximately 1.1 cm greatest thickness. 2. No pulmonary embolism seen.  3. Minimal bibasilar atelectasis. Lungs otherwise clear. No pneumonia or pulmonary edema. These results were called by telephone at the time of interpretation on 06/23/2017 at 3:35 pm to Dr. Frazier Richards , who verbally acknowledged these results. Aortic Atherosclerosis (ICD10-I70.0). Electronically Signed   By: Franki Cabot M.D.   On: 06/23/2017 15:40     Assessment & Plan:   OSA (obstructive sleep apnea) CPAP control not as optimal  Will adjust setting in hopes to decrease events   Plan  Patient Instructions  Change CPAP to 10 to 15 cm H2O. CPAP download on return .  Follow-up with Dr. Halford Chessman in 2 weeks with a PFT (or Navreet Bolda NP )       Dyspnea DOE for 4 to 6 weeks questionable etiology.  Patient had extensive cardiac work-up that is been unrevealing. Lab work including CBC and TSH were unremarkable. Exhaled nitric oxide testing today is normal.  No desaturations on room air with ambulation.  Will have patient return for a full PFT. CT chest was negative for PE.  Lungs were clear except for bibasilar atelectasis.  There was a moderate pericardial effusion however further cardiac work-up showed only a trivial effusion on echo  Plan  Return for PFT        Rexene Edison, NP 06/29/2017

## 2017-06-29 NOTE — Progress Notes (Signed)
Reviewed and agree with assessment/plan.   Porschea Borys, MD [facility] 01/16/2016, 12:24 PM Pager:  [phone]  

## 2017-06-29 NOTE — Addendum Note (Signed)
Addended by: Parke Poisson E on: 06/29/2017 05:33 PM   Modules accepted: Orders

## 2017-06-29 NOTE — Patient Instructions (Addendum)
Change CPAP to 10 to 15 cm H2O. CPAP download on return .  Follow-up with Dr. Halford Chessman in 2 weeks with a PFT (or Thomson Herbers NP )

## 2017-06-29 NOTE — Assessment & Plan Note (Signed)
CPAP control not as optimal  Will adjust setting in hopes to decrease events   Plan  Patient Instructions  Change CPAP to 10 to 15 cm H2O. CPAP download on return .  Follow-up with Dr. Halford Chessman in 2 weeks with a PFT (or Gil Ingwersen NP )

## 2017-06-29 NOTE — Assessment & Plan Note (Signed)
DOE for 4 to 6 weeks questionable etiology.  Patient had extensive cardiac work-up that is been unrevealing. Lab work including CBC and TSH were unremarkable. Exhaled nitric oxide testing today is normal.  No desaturations on room air with ambulation.  Will have patient return for a full PFT. CT chest was negative for PE.  Lungs were clear except for bibasilar atelectasis.  There was a moderate pericardial effusion however further cardiac work-up showed only a trivial effusion on echo  Plan  Return for PFT

## 2017-07-03 ENCOUNTER — Ambulatory Visit (INDEPENDENT_AMBULATORY_CARE_PROVIDER_SITE_OTHER): Payer: Medicare Other | Admitting: Pulmonary Disease

## 2017-07-03 ENCOUNTER — Telehealth: Payer: Self-pay | Admitting: Pulmonary Disease

## 2017-07-03 DIAGNOSIS — R0609 Other forms of dyspnea: Secondary | ICD-10-CM

## 2017-07-03 LAB — PULMONARY FUNCTION TEST
DL/VA % pred: 73 %
DL/VA: 3.23 ml/min/mmHg/L
DLCO UNC % PRED: 41 %
DLCO UNC: 12.05 ml/min/mmHg
FEF 25-75 PRE: 0.77 L/s
FEF 25-75 Post: 1.13 L/sec
FEF2575-%Change-Post: 47 %
FEF2575-%PRED-PRE: 44 %
FEF2575-%Pred-Post: 65 %
FEV1-%Change-Post: 10 %
FEV1-%PRED-POST: 64 %
FEV1-%Pred-Pre: 57 %
FEV1-Post: 1.62 L
FEV1-Pre: 1.46 L
FEV1FVC-%CHANGE-POST: 12 %
FEV1FVC-%Pred-Pre: 92 %
FEV6-%CHANGE-POST: -2 %
FEV6-%PRED-PRE: 66 %
FEV6-%Pred-Post: 64 %
FEV6-Post: 2.16 L
FEV6-Pre: 2.21 L
FEV6FVC-%Change-Post: -1 %
FEV6FVC-%Pred-Post: 105 %
FEV6FVC-%Pred-Pre: 106 %
FVC-%Change-Post: 0 %
FVC-%Pred-Post: 61 %
FVC-%Pred-Pre: 61 %
FVC-Post: 2.2 L
FVC-Pre: 2.22 L
POST FEV6/FVC RATIO: 98 %
PRE FEV1/FVC RATIO: 66 %
Post FEV1/FVC ratio: 74 %
Pre FEV6/FVC Ratio: 100 %
RV % pred: 81 %
RV: 2.07 L
TLC % PRED: 73 %
TLC: 4.84 L

## 2017-07-03 NOTE — Progress Notes (Signed)
PFT completed today. 07/03/17 

## 2017-07-03 NOTE — Telephone Encounter (Signed)
I called [employer] & spoke to [place].  She states looks like pt will be eligible for new machine.  She will need to follow up with Gilda & she is off today.  She states to tell pt someone will contact him Monday.  I sent skype to [place] to make pt's wife aware.  I gave her [employer]'s phone # to give them so they can follow up next week if they don't get a call back.  Nothing further needed.

## 2017-07-06 ENCOUNTER — Emergency Department: Payer: Medicare Other

## 2017-07-06 ENCOUNTER — Other Ambulatory Visit: Payer: Self-pay

## 2017-07-06 ENCOUNTER — Emergency Department
Admission: EM | Admit: 2017-07-06 | Discharge: 2017-07-06 | Disposition: A | Discharge status: Home / Self Care | Payer: Medicare Other | Attending: Emergency Medicine | Admitting: Emergency Medicine

## 2017-07-06 ENCOUNTER — Telehealth: Payer: Self-pay | Admitting: Pulmonary Disease

## 2017-07-06 DIAGNOSIS — R0602 Shortness of breath: Secondary | ICD-10-CM

## 2017-07-06 DIAGNOSIS — Z7982 Long term (current) use of aspirin: Secondary | ICD-10-CM | POA: Diagnosis not present

## 2017-07-06 DIAGNOSIS — E8770 Fluid overload, unspecified: Secondary | ICD-10-CM

## 2017-07-06 DIAGNOSIS — Z79899 Other long term (current) drug therapy: Secondary | ICD-10-CM | POA: Diagnosis not present

## 2017-07-06 LAB — BASIC METABOLIC PANEL
Anion gap: 13 (ref 5–15)
BUN: 19 mg/dL (ref 6–20)
CALCIUM: 8.2 mg/dL — AB (ref 8.9–10.3)
CHLORIDE: 96 mmol/L — AB (ref 101–111)
CO2: 23 mmol/L (ref 22–32)
CREATININE: 0.9 mg/dL (ref 0.61–1.24)
GFR calc non Af Amer: >60 mL/min (ref >60)
Glucose, Bld: 142 mg/dL — ABNORMAL HIGH (ref 65–99)
Potassium: 4.3 mmol/L (ref 3.5–5.1)
SODIUM: 132 mmol/L — AB (ref 135–145)

## 2017-07-06 LAB — CBC
HCT: 38.1 % — ABNORMAL LOW (ref 40.0–52.0)
HEMOGLOBIN: 12.9 g/dL — AB (ref 13.0–18.0)
MCH: 32.2 pg (ref 26.0–34.0)
MCHC: 33.8 g/dL (ref 32.0–36.0)
MCV: 95.3 fL (ref 80.0–100.0)
PLATELETS: 225 10*3/uL (ref 150–440)
RBC: 4 MIL/uL — AB (ref 4.40–5.90)
RDW: 14.8 % — ABNORMAL HIGH (ref 11.5–14.5)
WBC: 8.8 10*3/uL (ref 3.8–10.6)

## 2017-07-06 LAB — BRAIN NATRIURETIC PEPTIDE: B Natriuretic Peptide: 200 pg/mL — ABNORMAL HIGH (ref 0.0–100.0)

## 2017-07-06 LAB — TROPONIN I

## 2017-07-06 MED ORDER — IPRATROPIUM-ALBUTEROL 0.5-2.5 (3) MG/3ML IN SOLN
3.0000 mL | Freq: Once | RESPIRATORY_TRACT | Status: AC
  Administered 2017-07-06: 3 mL via RESPIRATORY_TRACT
  Filled 2017-07-06: qty 3

## 2017-07-06 MED ORDER — IPRATROPIUM-ALBUTEROL 0.5-2.5 (3) MG/3ML IN SOLN
3.0000 mL | Freq: Once | RESPIRATORY_TRACT | Status: AC
Start: 2017-07-06 — End: 2017-07-06
  Administered 2017-07-06: 3 mL via RESPIRATORY_TRACT
  Filled 2017-07-06: qty 3

## 2017-07-06 MED ORDER — FUROSEMIDE 10 MG/ML IJ SOLN
40.0000 mg | Freq: Once | INTRAMUSCULAR | Status: AC
  Administered 2017-07-06: 40 mg via INTRAVENOUS
  Filled 2017-07-06: qty 4

## 2017-07-06 NOTE — Discharge Instructions (Addendum)
Increase your Lasix to 40 mg for the next 3 days.  After that resume your normal dose.  Follow-up with your pulmonologist on your appointment tomorrow morning.  Follow-up with cardiology in 2 days.  Return to the emergency room for chest pain, new or worsening shortness of breath, dizziness, or any other symptoms concerning to you.

## 2017-07-06 NOTE — Telephone Encounter (Signed)
Attempted to call Jonni Sanger. I did not receive an answer. I have left a message for Jonni Sanger to return our call.

## 2017-07-06 NOTE — ED Triage Notes (Signed)
Patient has weakness and shortness of breath that seemed worse this morning.  Patient has recently seen cardiologists and has multiple tests performed.  Patient has had a cough.  Patient has gained approx. 10 pounds in the past week after being started on lasix.

## 2017-07-06 NOTE — ED Notes (Addendum)
PT was able to ambulate with assistance. Pt only able to tolerate 2 minutes of ambulating without audible wheezing and increased WOB noted. Pt reported feeling too weak to continue. Oxygen decreased to 94% but then increased to 99% after sitting back on bed.  MD aware.

## 2017-07-06 NOTE — Telephone Encounter (Signed)
Attempted to call Gordon Cameron with [employer]. I did not receive an answer. I have left a message for Gordon Cameron to return our call.

## 2017-07-06 NOTE — Telephone Encounter (Signed)
Jonni Sanger with [name] [phone]) returned phone call

## 2017-07-06 NOTE — ED Provider Notes (Signed)
[facility] Emergency Department Provider Note  ____________________________________________  Time seen: Approximately 4:25 PM  I have reviewed the triage vital signs and the nursing notes.   HISTORY  Chief Complaint Shortness of Breath and Weakness   HPI Gordon Cameron is a 83 y.o. male with a history of OSA on CPAP, hyperlipidemia, BPH who presents for evaluation of shortness of breath and generalized weakness.  Patient reports that his symptoms have been ongoing for 2 weeks.  He has had an extensive evaluation including echocardiogram, stress test, CT angiogram, labs including CBC, TSH.  All evaluations so far have been unrevealing.  Patient just went pulmonary function tests 2 days ago but those results are not available yet.  He has been using his CPAP.  He was started on Lasix a week ago and reports that since then he has gained 10 pounds.  Since last night he has had severe orthopnea, has been sleeping his recliner, and has had severe swelling in his bilateral lower extremities.  He has had no chest pain.  His shortness of breath is mild at rest but present and severe with exertion and laying flat.  He denies any chest pain.  In his chart it is documented the patient has a history of reactive airway disease but patient says he does not remember ever receiving this diagnosis.  He is not on any inhalers.  No fever or chills, no URI symptoms.  No personal or family history of blood clots, recent travel immobilization, leg pain, hemoptysis, exogenous hormones, or history of cancer.  Past Medical History:  Diagnosis Date  . BPH (benign prostatic hyperplasia)   . Hyperlipidemia   . OSA (obstructive sleep apnea)   . Reactive airway disease   . Restless leg     Patient Active Problem List   Diagnosis Date Noted  . Dyspnea 06/29/2017  . OSA (obstructive sleep apnea) 06/02/2011    Past Surgical History:  Procedure Laterality Date  . BACK SURGERY     2001    . COLONOSCOPY WITH PROPOFOL N/A 01/28/2017   Procedure: COLONOSCOPY WITH PROPOFOL;  Surgeon: Manya Silvas, MD;  Location: [facility] ENDOSCOPY;  Service: Endoscopy;  Laterality: N/A;  . ELBOW SURGERY Right   . NECK SURGERY      Prior to Admission medications   Medication Sig Start Date End Date Taking? Authorizing Provider  aspirin 81 MG tablet Take 81 mg by mouth daily.    [provider]  cetirizine (ZYRTEC) 10 MG tablet Take 10 mg by mouth daily as needed.     [provider]  finasteride (PROSCAR) 5 MG tablet Once a day 05/25/11   [provider]  fluticasone (FLONASE) 50 MCG/ACT nasal spray Place 2 sprays into both nostrils daily.    [provider]  metoprolol succinate (TOPROL-XL) 50 MG 24 hr tablet Take 50 mg by mouth daily. Take with or immediately following a meal.    [provider]  omeprazole (PRILOSEC) 20 MG capsule Take 20 mg by mouth daily.    [provider]    Allergies Patient has no known allergies.  Family History  Problem Relation Age of Onset  . Colon cancer Maternal Grandmother   . Colon cancer Brother   . Colon cancer Brother     Social History Social History   Tobacco Use  . Smoking status: Never Smoker  . Smokeless tobacco: Never Used  Substance Use Topics  . Alcohol use: No    Alcohol/week:  0.0 oz  . Drug use: No    Review of Systems  Constitutional: Negative for fever. Eyes: Negative for visual changes. ENT: Negative for sore throat. Neck: No neck pain  Cardiovascular: Negative for chest pain. + orthopnea Respiratory: + shortness of breath. Gastrointestinal: Negative for abdominal pain, vomiting or diarrhea. Genitourinary: Negative for dysuria. Musculoskeletal: Negative for back pain. + b/l leg swelling Skin: Negative for rash. Neurological: Negative for headaches, weakness or numbness. Psych: No SI or HI  ____________________________________________   PHYSICAL EXAM:  VITAL  SIGNS: ED Triage Vitals  Enc Vitals Group     BP 07/06/17 1420 133/86     Pulse Rate 07/06/17 1420 88     Resp 07/06/17 1420 (!) 32     Temp 07/06/17 1420 97.8 F (36.6 C)     Temp Source 07/06/17 1420 Oral     SpO2 07/06/17 1420 98 %     Weight 07/06/17 1421 226 lb (102.5 kg)     Height 07/06/17 1421 5' 8"  (1.727 m)     Head Circumference --      Peak Flow --      Pain Score --      Pain Loc --      Pain Edu? --      Excl. in [place]? --     Constitutional: Alert and oriented. Well appearing and in no apparent distress. HEENT:      Head: Normocephalic and atraumatic.         Eyes: Conjunctivae are normal. Sclera is non-icteric.       Mouth/Throat: Mucous membranes are moist.       Neck: Supple with no signs of meningismus. Cardiovascular: Regular rate and rhythm. No murmurs, gallops, or rubs. 2+ symmetrical distal pulses are present in all extremities. Elevated JVD to angle of the jaw Respiratory: Increased work of breathing, tachypneic, normal sats, bilateral faint expiratory wheezes and decreased air movement. Gastrointestinal: Soft, non tender, and non distended with positive bowel sounds. No rebound or guarding. Musculoskeletal: 3+ pitting edema to knees bilaterally  neurologic: Normal speech and language. Face is symmetric. Moving all extremities. No gross focal neurologic deficits are appreciated. Skin: Skin is warm, dry and intact. No rash noted. Psychiatric: Mood and affect are normal. Speech and behavior are normal.  ____________________________________________   LABS (all labs ordered are listed, but only abnormal results are displayed)  Labs Reviewed  BASIC METABOLIC PANEL - Abnormal; Notable for the following components:      Result Value   Sodium 132 (*)    Chloride 96 (*)    Glucose, Bld 142 (*)    Calcium 8.2 (*)    All other components within normal limits  CBC - Abnormal; Notable for the following components:   RBC 4.00 (*)    Hemoglobin 12.9 (*)    HCT  38.1 (*)    RDW 14.8 (*)    All other components within normal limits  BRAIN NATRIURETIC PEPTIDE - Abnormal; Notable for the following components:   B Natriuretic Peptide 200.0 (*)    All other components within normal limits  TROPONIN I   ____________________________________________  EKG  ED ECG REPORT I, Rudene Re, the attending physician, personally viewed and interpreted this ECG.  Normal sinus rhythm, rate of 88, normal intervals, normal axis, low voltage QRS, no ST elevations or depressions, diffuse T wave flattening.  Flattening and low QRS is new when compared to prior from 2014. ____________________________________________  RADIOLOGY  I have personally reviewed the  images performed during this visit and I agree with the Radiologist's read.   Interpretation by Radiologist:  Dg Chest 2 View  Result Date: 07/06/2017 CLINICAL DATA:  Shortness of breath, cough, weakness. EXAM: CHEST - 2 VIEW COMPARISON:  CT chest 06/23/2017. FINDINGS: Trachea is midline. Heart size normal. Lungs are somewhat low in volume with tiny bilateral pleural effusions. IMPRESSION: Tiny bilateral pleural effusions. Electronically Signed   By: Lorin Picket M.D.   On: 07/06/2017 15:19     ____________________________________________   PROCEDURES  Procedure(s) performed: None Procedures Critical Care performed:  None ____________________________________________   INITIAL IMPRESSION / ASSESSMENT AND PLAN / ED COURSE   83 y.o. male with a history of OSA on CPAP, hyperlipidemia, BPH who presents for evaluation of shortness of breath, orthopnea, weight gain, and generalized weakness x 2 weeks.  Patient has had an extensive evaluation with negative CTA, echocardiogram showing EF of 50% and a very small pericardial effusion, stress test which is within normal limits.  PFTs were done with the results are not available.  At this time patient has increased work of breathing, he is tachypneic with  normal sats, he has faint expiratory wheezes and slightly decreased air movement bilaterally.  Patient denies of a history of asthma however it is documented in his chart therefore we will try to DuoNeb's to see if patient will obtain any relief.  On exam he does look like CHF exacerbation since patient is extremely volume overloaded, 10 pound weight gain in a week, and orthopnea.  BNP is slightly elevated at 200.  He has 3+ pitting edema and elevated JVD on exam. With a normal echo done 11 days ago this is most likely worsening pulmonary hypertension. Will give 64m of IV lasix and reassess.  If patient remains with no new oxygen requirement and feels improved after treatment we will plan to discharge home since he has an appointment with his pulmonologist tomorrow morning.    _________________________ 6:51 PM on 07/06/2017 -----------------------------------------  No changes in SOB with duoneb, no changes on wheezing. Patient diuresed in the ED and reports mild improvement. Able to ambulate with no hypoxia although he felt short of breath after going around the ED once. Offered admission for diurese but patient prefers to go home as he has an [facility] Recommended increasing lasix to 447mdaily for 3 days and follow up with his Cardiologist in 2 days. Discussed very strict return precautions with patient, wife, and daughter who are all at the bedside.   As part of my medical decision making, I reviewed the following data within the [place] reviewed and incorporated, Labs reviewed , EKG interpreted , Old chart reviewed, Radiograph reviewed , Notes from prior ED visits and [place] Controlled Substance Database    Pertinent labs & imaging results that were available during my care of the patient were reviewed by me and considered in my medical decision making (see chart for  details).    ____________________________________________   FINAL CLINICAL IMPRESSION(S) / ED DIAGNOSES  Final diagnoses:  Hypervolemia, unspecified hypervolemia type  Shortness of breath      NEW MEDICATIONS STARTED DURING THIS VISIT:  ED Discharge Orders    None       Note:  This document was prepared using Dragon voice recognition software and may include unintentional dictation errors.    VeAlfred LevinsCaKentuckyMD 07/06/17 [phone]

## 2017-07-07 ENCOUNTER — Ambulatory Visit (INDEPENDENT_AMBULATORY_CARE_PROVIDER_SITE_OTHER): Payer: Medicare Other | Admitting: Pulmonary Disease

## 2017-07-07 ENCOUNTER — Telehealth: Payer: Self-pay | Admitting: Pulmonary Disease

## 2017-07-07 ENCOUNTER — Other Ambulatory Visit (INDEPENDENT_AMBULATORY_CARE_PROVIDER_SITE_OTHER): Payer: Medicare Other

## 2017-07-07 ENCOUNTER — Encounter: Payer: Self-pay | Admitting: Pulmonary Disease

## 2017-07-07 VITALS — BP 140/82 | HR 72 | Ht 68.0 in | Wt 224.6 lb

## 2017-07-07 DIAGNOSIS — I309 Acute pericarditis, unspecified: Secondary | ICD-10-CM | POA: Diagnosis not present

## 2017-07-07 DIAGNOSIS — R0609 Other forms of dyspnea: Secondary | ICD-10-CM

## 2017-07-07 DIAGNOSIS — G4733 Obstructive sleep apnea (adult) (pediatric): Secondary | ICD-10-CM | POA: Diagnosis not present

## 2017-07-07 LAB — SEDIMENTATION RATE: Sed Rate: 22 mm/hr — ABNORMAL HIGH (ref 0–20)

## 2017-07-07 MED ORDER — PREDNISONE 20 MG PO TABS: 20.0000 mg | ORAL_TABLET | Freq: Every day | ORAL | 1 refills | Status: DC

## 2017-07-07 MED ORDER — AZITHROMYCIN 500 MG PO TABS: 500.0000 mg | ORAL_TABLET | Freq: Every day | ORAL | 0 refills | Status: DC

## 2017-07-07 MED ORDER — BUDESONIDE-FORMOTEROL FUMARATE 160-4.5 MCG/ACT IN AERO: 2.0000 | INHALATION_SPRAY | Freq: Two times a day (BID) | RESPIRATORY_TRACT | 6 refills | Status: DC

## 2017-07-07 MED ORDER — MOMETASONE FURO-FORMOTEROL FUM 100-5 MCG/ACT IN AERO: 2.0000 | INHALATION_SPRAY | Freq: Two times a day (BID) | RESPIRATORY_TRACT | 3 refills | Status: DC

## 2017-07-07 NOTE — Telephone Encounter (Signed)
Called and spoke with Jonni Sanger from [employer] [facility] who stated he was needing TP to do an addendum to OV from 6/10 stating pt's complaince and benefits from CPAP.  Routing message to TP and to Jess for them to follow up on so TP can make an addendum to her OV.

## 2017-07-07 NOTE — Telephone Encounter (Signed)
Called and spoke with patients wife. She is aware of medication change. I have sent in prescription. Nothing further needed.

## 2017-07-07 NOTE — Telephone Encounter (Signed)
I have left a message with Jonni Sanger with [employer] to return our call.

## 2017-07-07 NOTE — Telephone Encounter (Signed)
Gordon Cameron, wife, calling back with alternative medications to Symbicort.  These are albuterol, Flovent, Ventolin, Bevespi, Advair, and Dulera.  States these were under [employer]'s list found online.   Gordon Cameron CB is [phone].

## 2017-07-07 NOTE — Telephone Encounter (Signed)
Called and spoke with pt's daughter Gordon Cameron who states Symbicort for 1 month is $360 which is too expensive for pt.  For a 3 month supply for symbicort, it is going to be $510.  Pt and Gordon Cameron are wanting to know if there are other inhalers than the Symbicort that pt can be put on that might be cheaper.  Stated to [place] that they needed to call insurance company to get a drug formulary list so we can see which inhalers are preferred inhalers.  Also stated to [place] I would send this over to Dr. Halford Chessman to see if he might be able to give other alternatives than the Symbicort to send in for pt to see if it might be cheaper for pt.  Gordon Cameron expressed understanding. Dr. Halford Chessman, please advise on the above. Thanks!

## 2017-07-07 NOTE — Telephone Encounter (Signed)
If he is eligible for an new machine, then why can't he get a new auto CPAP machine?

## 2017-07-07 NOTE — Patient Instructions (Signed)
Zithromax 500 mg daily for 7 days Prednisone 20 mg daily until follow up appointment Stop taking naproxen Symbicort two puffs twice per day, and rinse mouth after each dose Lab tests today  Follow up in 1 week with Dr. Halford Chessman or Nurse Practitioner

## 2017-07-07 NOTE — Telephone Encounter (Signed)
Pt's settings need to be auto CPAP range 5 to 20 cm H2O

## 2017-07-07 NOTE — Progress Notes (Signed)
[place]  Chief Complaint  Patient presents with  . Follow-up    Pt seen TP last week; reviewed DL last OV. Pt has severe swelling in legs, face and hands. Pt has SOB, wheezing, and breathing issues, and productive cough-yellow in last 2 weeks.    Vital signs: BP 140/82 (BP Location: Left Arm, Cuff Size: Normal)   Pulse 72   Ht _0  (1.727 m)   Wt 224 lb 9.6 oz (101.9 kg)   SpO2 100%   BMI 34.15 kg/m   History of Present Illness: Gordon Cameron is a 83 y.o. male with obstructive sleep apnea.  I last saw him in 2017 for sleep apnea.  He developed shortness of breath a few weeks ago that has persisted.  He also feels fatigued.  He has a pain across his chest that gets worse when he leans forward.  He has a cough and has been bringing up yellow sputum.  He is feeling bloated in his abdomen and has swelling in his legs.  CT chest showed pericardial effusion.  He was started on lopressor, lasix, and naproxen.  His symptoms have persisted.  He hasn't had fever.  He is getting irritation in his throat.  No skin rash, joint swelling.  His PFT showed mild obstruction/restriction, and moderate to severe diffusion defect.  He uses CPAP nightly.  No issues with mask fit.  His machine is old, and isn't delivering appropriate pressure.  He has contacted his DME, but has been getting mixed messages about how to get a a new machine.  Physical Exam:  General - pleasant Eyes - pupils reactive ENT - no sinus tenderness, no oral exudate, no LAN Cardiac - regular, no murmur Chest - no wheeze, rales Abd - soft, non tender Ext - no edema Skin - no rashes Neuro - normal strength Psych - normal mood   CBC Latest Ref Rng & Units 07/06/2017  WBC 3.8 - 10.6 K/uL 8.8  Hemoglobin 13.0 - 18.0 g/dL 12.9(L)  Hematocrit 40.0 - 52.0 % 38.1(L)  Platelets 150 - 440 K/uL 225    CMP Latest Ref Rng & Units 07/06/2017 06/23/2017  Glucose 65 - 99 mg/dL 142(H) -  BUN  6 - 20 mg/dL 19 -  Creatinine 0.61 - 1.24 mg/dL 0.90 0.90  Sodium 135 - 145 mmol/L 132(L) -  Potassium 3.5 - 5.1 mmol/L 4.3 -  Chloride 101 - 111 mmol/L 96(L) -  CO2 22 - 32 mmol/L 23 -  Calcium 8.9 - 10.3 mg/dL 8.2(L) -    Discussion: He has subacute onset of dyspnea, cough, pleuritic type chest pain worse in the forward position, and peripheral edema.  His CT chest showed pericardial effusion.  His PFT showed mixed obstruction/restriction with diffusion defect.  He never smoked, no history of asthma, and no recent exposures.  He has been compliant with CPAP.  His machine is old and no longer function.  He needs to get a new machine.  Assessment/Plan:  Dyspnea with concern for acute pericarditis. - change to prednisone 20 mg daily and d/c naproxen - zithromax 500 mg daily for 7 days - symbicort two puffs twice per day - continue lasix - check ANA, RF, ESR  Obstructive sleep apnea. - he is compliant with CPAP and reports benefit from therapy - he needs new machine since his current device is more than 81 yrs old and no longer functional - will arrange for auto CPAP range 5 to 20  cm H2O   Patient Instructions  Zithromax 500 mg daily for 7 days Prednisone 20 mg daily until follow up appointment Stop taking naproxen Symbicort two puffs twice per day, and rinse mouth after each dose Lab tests today  Follow up in 1 week with Dr. Halford Chessman or Nurse Practitioner  Time spent 41 minutes  [name], MD [place] 07/07/2017, 9:54 AM  Flow Sheet  Pulmonary tests: CT angio chest 06/23/17 >> mod pericardial effusion PFT 07/03/17 >> FEV1 1.62 (64%), FEV1% 74, TLC 4.84 (73%), DLCO 41%  Sleep tests: CPAP titration 06/17/06 ([facility]) >> CPAP 15 cm H2O. CPAP 08/11/11 to 09/09/11 >> Used on 23 of 30 nights with average 7 hrs 10 min. Average AHI 3.3 with CPAP 11 cm H2O. CPAP 11/22/12 to 12/21/12 >> used on 29 of 30 nights with average 6 hrs 36 min. Average AHI  4.3 with CPAP 11 cm H2O. CPAP 05/06/15 to 06/04/15 >> used on 30 of 30 nights with average 7 hrs and 18 min.  Average AHI is 3 with CPAP 11 cm H2O.  Cardiac tests: Echo 06/25/17 >> EF 50%, mild AR/MR, mild effusion, mild LVH Stress test 06/25/17 >> nl LV fx, nl wall motion  Past Medical History: He  has a past medical history of BPH (benign prostatic hyperplasia), Hyperlipidemia, OSA (obstructive sleep apnea), Reactive airway disease, and Restless leg.  Past Surgical History: He  has a past surgical history that includes Neck surgery; Back surgery; Elbow surgery (Right); and Colonoscopy with propofol (N/A, 01/28/2017).  Family History: His family history includes Colon cancer in his brother, brother, and maternal grandmother.  Social History: He  reports that he has never smoked. He has never used smokeless tobacco. He reports that he does not drink alcohol or use drugs.  Medications: Allergies as of 07/07/2017   No Known Allergies     Medication List        Accurate as of 07/07/17  9:54 AM. Always use your most recent med list.          aspirin 81 MG tablet Take 81 mg by mouth daily.   azithromycin 500 MG tablet Commonly known as:  ZITHROMAX Take 1 tablet (500 mg total) by mouth daily.   budesonide-formoterol 160-4.5 MCG/ACT inhaler Commonly known as:  SYMBICORT Inhale 2 puffs into the lungs 2 (two) times daily.   cetirizine 10 MG tablet Commonly known as:  ZYRTEC Take 10 mg by mouth daily as needed.   finasteride 5 MG tablet Commonly known as:  PROSCAR Once a day   fluticasone 50 MCG/ACT nasal spray Commonly known as:  FLONASE Place 2 sprays into both nostrils daily.   metoprolol succinate 50 MG 24 hr tablet Commonly known as:  TOPROL-XL Take 50 mg by mouth daily. Take with or immediately following a meal.   omeprazole 20 MG capsule Commonly known as:  PRILOSEC Take 20 mg by mouth daily.   predniSONE 20 MG tablet Commonly known as:  DELTASONE Take 1 tablet  (20 mg total) by mouth daily with breakfast.

## 2017-07-07 NOTE — Telephone Encounter (Signed)
Called Andy from [facility] who states pt's CPAP needs to be on a set pressure and is wanting to know what settings pt's machine needs to be on.  Pt's family is currently at [place] now with Jonni Sanger and he is trying to figure out what settings pt's machine needs to be on.  Dr. Halford Chessman, please advise. Thanks!

## 2017-07-07 NOTE — Telephone Encounter (Signed)
Can change to dulera 100/5 two puffs bid.

## 2017-07-07 NOTE — Telephone Encounter (Signed)
Called Gordon Cameron back stating to him the settings of auto CPAP range 5 to 20.  Gordon Cameron states pt's machine has the auto trial for 7 days.   Gordon Cameron stated he will put pt's settings at 10 to 15 using the auto trial for 7 days and then pt's machine will automatically go back to best pressure for pt.  Pt is eligible for a new machine and due to this pt can not be loaned a machine.    Settings per Gordon Cameron are going to be the 7 day trial at 52 to 67 and then the machine will automatically set to best settings for pt after the 7 day auto trial. Nothing further needed.

## 2017-07-08 ENCOUNTER — Telehealth: Payer: Self-pay | Admitting: Pulmonary Disease

## 2017-07-08 NOTE — Telephone Encounter (Signed)
Received fax from [facility]. Insurance contact #: [phone]. Insurance ID: [id number]  Called [employer] at the above number. Spoke with Saralyn Pilar. PA has been initiated. This could take up to 72 hours for a determination. PA reference #: [id number].  I have contact the pt's wife, Hoyle Sauer. She is aware that this PA has been initiated. Will wait PA decision.

## 2017-07-09 LAB — ANA,IFA RA DIAG PNL W/RFLX TIT/PATN: ANA: POSITIVE — AB

## 2017-07-09 LAB — ANTI-NUCLEAR AB-TITER (ANA TITER)

## 2017-07-09 NOTE — Telephone Encounter (Signed)
In phone note from 07/07/17 it was stated that symbicort was too expense, but dulera was listed as an alternative.  Can you please verify what is listed as an alternative inhaler on [employer]'s formulary.

## 2017-07-09 NOTE — Telephone Encounter (Signed)
PA denial rec'd today. Gordon Cameron is not on [employer]'s list of preferred drugs. VS please advise on a new inhaler for patient. Thanks.

## 2017-07-10 MED ORDER — FLUTICASONE-SALMETEROL 250-50 MCG/DOSE IN AEPB: 1.0000 | INHALATION_SPRAY | Freq: Two times a day (BID) | RESPIRATORY_TRACT | 5 refills | Status: DC

## 2017-07-10 NOTE — Telephone Encounter (Signed)
Please send advair diskus 250 one puff bid.

## 2017-07-10 NOTE — Telephone Encounter (Signed)
Rx of Advair 250 sent to pt's pharmacy.  Called pt and spoke with spouse Hoyle Sauer stating to her that this had been done.  Hoyle Sauer expressed understanding. Nothing further needed.

## 2017-07-10 NOTE — Telephone Encounter (Signed)
[facility] and spoke with Gordon Cameron stating to her I was needing to know covered alternatives for Symbicort 160.  After a lengthy conversation while trying to figure out the covered alternatives for Symbicort, per Renee,  covered alternatives for Symbicort are Advair Diskus which is $29 approx and Advair HFA which is $109 approx

## 2017-07-13 NOTE — Progress Notes (Signed)
@Patient  ID: Gordon Cameron, male    DOB: Aug 29, 1934, 83 y.o.   MRN: [id number]  Chief Complaint  Patient presents with  . Follow-up    States his breathing is much better. Cough has improved. Fininshed zpack yesterday.     Referring provider: Kirk Ruths, MD  HPI: 83 year old male patient followed in our office for obstructive sleep apnea, with CPAP use.  Patient is followed by Dr. Halford Chessman.  Recent [facility] Encounters:  07/07/2017-office visit-Dr. Halford Chessman Plan: Lab work, follow-up in 2 weeks, continue cardiology follow-up, prednisone, Zithromax, continue Advair   Tests:   Pulmonary tests: CT angio chest 06/23/17 >> mod pericardial effusion PFT 07/03/17 >> FEV1 1.62 (64%), FEV1% 74, TLC 4.84 (73%), DLCO 41% >>> mild obstruction, positive bronchodilator response, mild restriction, moderate to severe diffusion defect, concavity and flow volume loops  Sleep tests: CPAP titration 06/17/06 ([facility]) >> CPAP 15 cm H2O. CPAP 08/11/11 to 09/09/11 >> Used on 23 of 30 nights with average 7 hrs 10 min. Average AHI 3.3 with CPAP 11 cm H2O. CPAP 11/22/12 to 12/21/12 >> used on 29 of 30 nights with average 6 hrs 36 min. Average AHI 4.3 with CPAP 11 cm H2O. CPAP 05/06/15 to 06/04/15 >> used on 30 of 30 nights with average 7 hrs and 18 min.  Average AHI is 3 with CPAP 11 cm H2O.   Imaging:  07/06/2017- chest x-ray-tiny bilateral pleural effusions  Cardiac:  Echo 06/25/17 >> EF 50%, mild AR/MR, mild effusion, mild LVH Stress test 06/25/17 >> nl LV fx, nl wall motion  Labs:  07/07/2017-sed rate 22, ANA positive, ANA titer 1 1: 40, ANA pattern 1 homogeneous,  Micro:   Chart Review:     07/13/17 OV  Pleasant patient seen in office today with spouse and daughter present.  Patient reporting he is feeling better since seen Dr. Halford Chessman and starting prednisone and antibiotics.  They are still current concerned about retaining fluid.  Patient reports that breathing is better,  has been adhering to Advair, cough is present but improving.  Occasionally cough is productive with yellow mucus.  Patient still having pleuritic chest pain specifically when he coughs.  Or when he leans forward.  Patient following up with [facility] tomorrow.  Per chart review seems like patient will probably get an echocardiogram repeated based off their note.  Family and patient unsure if that will happen at tomorrow's appointment.  No Known Allergies  Immunization History  Administered Date(s) Administered  . Influenza Split 10/18/2012  . Influenza, High Dose Seasonal PF 11/20/2016  . Influenza-Unspecified 12/04/2013    Past Medical History:  Diagnosis Date  . BPH (benign prostatic hyperplasia)   . Hyperlipidemia   . OSA (obstructive sleep apnea)   . Reactive airway disease   . Restless leg     Tobacco History: Social History   Tobacco Use  Smoking Status Never Smoker  Smokeless Tobacco Never Used   Counseling given: Yes Continue not smoking peer  Outpatient Encounter Medications as of 07/14/2017  Medication Sig  . aspirin 81 MG tablet Take 81 mg by mouth daily.  . cetirizine (ZYRTEC) 10 MG tablet Take 10 mg by mouth daily as needed.   . finasteride (PROSCAR) 5 MG tablet Once a day  . fluticasone (FLONASE) 50 MCG/ACT nasal spray Place 2 sprays into both nostrils daily.  . Fluticasone-Salmeterol (ADVAIR DISKUS) 250-50 MCG/DOSE AEPB Inhale 1 puff into the lungs 2 (two) times daily.  . metoprolol succinate (  TOPROL-XL) 50 MG 24 hr tablet Take 50 mg by mouth daily. Take with or immediately following a meal.  . omeprazole (PRILOSEC) 20 MG capsule Take 20 mg by mouth daily.  Marland Kitchen azithromycin (ZITHROMAX) 500 MG tablet Take 1 tablet (500 mg total) by mouth daily. (Patient not taking: Reported on 07/14/2017)  . predniSONE (DELTASONE) 20 MG tablet Take 1 tablet (20 mg total) by mouth daily with breakfast. (Patient not taking: Reported on 07/14/2017)   No  facility-administered encounter medications on file as of 07/14/2017.      Review of Systems  Constitutional: +fatigue   No  weight loss, night sweats,  fevers, chills HEENT:   No headaches,  Difficulty swallowing,  Tooth/dental problems, or  Sore throat, No sneezing, itching, ear ache, nasal congestion, post nasal drip  CV: +pleuritic chest pain with cough No chest pain,  orthopnea, PND, swelling in lower extremities, anasarca, dizziness, palpitations, syncope  GI: No heartburn, indigestion, abdominal pain, nausea, vomiting, diarrhea, change in bowel habits, loss of appetite, bloody stools Resp: +cough, sob present with exertion but improving, yellow mucous,  No shortness of breath  at rest.    No coughing up of blood.  No wheezing.  No chest wall deformity Skin: no rash, lesions, no skin changes. GU: no dysuria, change in color of urine, no urgency or frequency.  No flank pain, no hematuria  MS:  No joint pain or swelling.  No decreased range of motion.  No back pain. Psych:  No change in mood or affect. No depression or anxiety.  No memory loss.   Physical Exam  BP 130/82   Pulse 98   Ht 5\' 8"  (1.727 m)   Wt 221 lb 6.4 oz (100.4 kg)   SpO2 93%   BMI 33.66 kg/m    Wt Readings from Last 3 Encounters:  07/14/17 221 lb 6.4 oz (100.4 kg)  07/07/17 224 lb 9.6 oz (101.9 kg)  07/06/17 226 lb (102.5 kg)     GEN: A/Ox3; pleasant , NAD, well nourished    HEENT:  [place]/AT,  EACs-clear, TMs-wnl, NOSE-clear, THROAT- mallampati II, clear, no lesions, no postnasal drip or exudate noted.  Sinus - non tender with palpation   NECK:  Supple w/ fair ROM; no JVD;  no thyromegaly or nodules palpated; no lymphadenopathy.    RESP:  Clear  P & A; w/o, wheezes/ rales/ or rhonchi. no accessory muscle use, no dullness to percussion  CARD:  +3 plus LE swelling bilaterally RRR, no m/r/g, no peripheral edema, pulses intact, no cyanosis or clubbing.  GI:   Tight, non tender; nml bowel sounds; no  organomegaly or masses detected.   Musco: Warm bil, no deformities or joint swelling noted.   Neuro: alert, no focal deficits noted.    Skin: Warm, no lesions or rashes    Lab Results:  CBC    Component Value Date/Time   WBC 8.8 07/06/2017 1423   RBC 4.00 (L) 07/06/2017 1423   HGB 12.9 (L) 07/06/2017 1423   HCT 38.1 (L) 07/06/2017 1423   PLT 225 07/06/2017 1423   MCV 95.3 07/06/2017 1423   MCH 32.2 07/06/2017 1423   MCHC 33.8 07/06/2017 1423   RDW 14.8 (H) 07/06/2017 1423    BMET    Component Value Date/Time   NA 132 (L) 07/06/2017 1423   K 4.3 07/06/2017 1423   CL 96 (L) 07/06/2017 1423   CO2 23 07/06/2017 1423   GLUCOSE 142 (H) 07/06/2017 1423   BUN 19  07/06/2017 1423   CREATININE 0.90 07/06/2017 1423   CALCIUM 8.2 (L) 07/06/2017 1423   GFRNONAA >60 07/06/2017 1423   GFRAA >60 07/06/2017 1423    BNP    Component Value Date/Time   BNP 200.0 (H) 07/06/2017 1423    ProBNP No results found for: PROBNP  Imaging: Dg Chest 2 View  Result Date: 07/06/2017 CLINICAL DATA:  Shortness of breath, cough, weakness. EXAM: CHEST - 2 VIEW COMPARISON:  CT chest 06/23/2017. FINDINGS: Trachea is midline. Heart size normal. Lungs are somewhat low in volume with tiny bilateral pleural effusions. IMPRESSION: Tiny bilateral pleural effusions. Electronically Signed   By: Lorin Picket M.D.   On: 07/06/2017 15:19   Ct Angio Chest Pe W Or Wo Contrast  Result Date: 06/23/2017 CLINICAL DATA:  Weakness and chest discomfort, shortness of breath for weeks, elevated D-dimer. EXAM: CT ANGIOGRAPHY CHEST WITH CONTRAST TECHNIQUE: Multidetector CT imaging of the chest was performed using the standard protocol during bolus administration of intravenous contrast. Multiplanar CT image reconstructions and MIPs were obtained to evaluate the vascular anatomy. CONTRAST:  68mL ISOVUE-370 IOPAMIDOL (ISOVUE-370) INJECTION 76% COMPARISON:  None. FINDINGS: Cardiovascular: There is a pericardial effusion,  moderate in size, measuring approximately 1.1 cm greatest thickness. Heart size is normal. No thoracic aortic aneurysm or evidence of aortic dissection. Mild aortic atherosclerosis. No pulmonary embolism identified within the main, lobar or segmental pulmonary arteries bilaterally. Mediastinum/Nodes: No mass or enlarged lymph nodes seen within the mediastinum or perihilar regions. Esophagus appears normal. Trachea and central bronchi are unremarkable. Lungs/Pleura: Mild bibasilar atelectasis. Lungs otherwise clear. No pleural effusion or pneumothorax. Upper Abdomen: Limited images of the upper abdomen are unremarkable. Musculoskeletal: No acute or suspicious osseous finding. Review of the MIP images confirms the above findings. IMPRESSION: 1. Pericardial effusion, moderate in size measuring approximately 1.1 cm greatest thickness. 2. No pulmonary embolism seen. 3. Minimal bibasilar atelectasis. Lungs otherwise clear. No pneumonia or pulmonary edema. These results were called by telephone at the time of interpretation on 06/23/2017 at 3:35 pm to Dr. Frazier Richards , who verbally acknowledged these results. Aortic Atherosclerosis (ICD10-I70.0). Electronically Signed   By: Franki Cabot M.D.   On: 06/23/2017 15:40     Assessment & Plan:   Pleasant patient seen office today.  We will have patient follow-up with our office in 4 weeks.  Patient to follow-up with cardiology tomorrow and will also discuss prednisone use with them.  Emphasized the importance of patient continuing to weigh daily, watch sodium in diet, and discuss pleuritic chest pain with cardiology tomorrow.  Encouraged patient and spouse to have cardiology note or plan routed to [place].  We can see some in care everywhere but just want to make sure that were up-to-date on what their plan is.  For his respiratory symptoms we will continue the Advair.  Reviewed pulmonary function test results with patient.  We will have patient follow-up in 4  weeks.  Dyspnea Continue Advair Reviewed PFT results Follow-up with our office in 4 weeks   OSA (obstructive sleep apnea) Continue with CPAP use Patient reports that mask is going well and not having issues Follow-up with our office if you are having any difficulties using your CPAP  Edema Generalized edema, primarily in lower extremities, slightly improved today 3+ edema lower extremities bilaterally Continue Lasix Continue follow-up with cardiology tomorrow Discussed fluid retention, diuretic, prednisone with cardiology at tomorrow's appointment     Lauraine Rinne, NP 07/14/2017

## 2017-07-14 ENCOUNTER — Encounter: Payer: Self-pay | Admitting: Pulmonary Disease

## 2017-07-14 ENCOUNTER — Ambulatory Visit (INDEPENDENT_AMBULATORY_CARE_PROVIDER_SITE_OTHER): Payer: Medicare Other | Admitting: Pulmonary Disease

## 2017-07-14 DIAGNOSIS — R609 Edema, unspecified: Secondary | ICD-10-CM | POA: Insufficient documentation

## 2017-07-14 DIAGNOSIS — R601 Generalized edema: Secondary | ICD-10-CM | POA: Diagnosis not present

## 2017-07-14 DIAGNOSIS — G4733 Obstructive sleep apnea (adult) (pediatric): Secondary | ICD-10-CM | POA: Diagnosis not present

## 2017-07-14 DIAGNOSIS — R0609 Other forms of dyspnea: Secondary | ICD-10-CM

## 2017-07-14 NOTE — Assessment & Plan Note (Signed)
Generalized edema, primarily in lower extremities, slightly improved today 3+ edema lower extremities bilaterally Continue Lasix Continue follow-up with cardiology tomorrow Discussed fluid retention, diuretic, prednisone with cardiology at tomorrow's appointment

## 2017-07-14 NOTE — Progress Notes (Signed)
Reviewed and agree with assessment/plan.   Birgit Nowling, MD [facility] 01/16/2016, 12:24 PM Pager:  [phone]  

## 2017-07-14 NOTE — Assessment & Plan Note (Signed)
Continue Advair Reviewed PFT results Follow-up with our office in 4 weeks

## 2017-07-14 NOTE — Patient Instructions (Addendum)
Continue your Advair Continue follow-up with cardiology tomorrow >>> See if they can forward your note to Dr. Halford Chessman electronically If you have any questions or concerns about your breathing please feel free to give [place] a call Follow-up with Dr. Halford Chessman or myself in 1 month  Please contact the office if your symptoms worsen or you have concerns that you are not improving.   Thank you for choosing [facility] for your healthcare, and for allowing [place] to partner with you on your healthcare journey. I am thankful to be able to provide care to you today.   Wyn Quaker FNP-C

## 2017-07-14 NOTE — Assessment & Plan Note (Signed)
Continue with CPAP use Patient reports that mask is going well and not having issues Follow-up with our office if you are having any difficulties using your CPAP

## 2017-07-20 NOTE — Telephone Encounter (Signed)
Per TP: this is already in the 06/29/17 office note.  Please see below copied from the 06/29/17 visit:  Pt is on CPAP At bedtime. Says he is doing well on CPAP . Wears it each night for 7 hr . does not feel as rested.  Weight is up 10lbs.  Download shows excellent compliance with avg usage 7 hr . AHI 15. Mask is leaking .  Needs new supplies .

## 2017-07-21 NOTE — Telephone Encounter (Signed)
Attempted to call Gordon Cameron with [name] today regarding pt's cpap compliance. I did not receive an answer at time of call. I have left a voicemail message for pt to return call. X1  Pt was seen on 07-15-17 by B.Mack; pt's compliant and benefiting well from cpap use. Also pt seen TP on 06-29-17  Please see below copied from the 06/29/17 visit:  Pt is on CPAPAt bedtime. Says he is doing well on CPAP . Wears it each night for 7 hr . does not feel as rested.  Weight is up 10lbs. Download shows excellent compliance with avg usage 7 hr . AHI 15. Mask is leaking .  Needs new supplies

## 2017-08-19 ENCOUNTER — Ambulatory Visit (INDEPENDENT_AMBULATORY_CARE_PROVIDER_SITE_OTHER): Payer: Medicare Other | Admitting: Pulmonary Disease

## 2017-08-19 ENCOUNTER — Encounter: Payer: Self-pay | Admitting: Pulmonary Disease

## 2017-08-19 VITALS — BP 102/64 | HR 84 | Ht 68.0 in | Wt 203.0 lb

## 2017-08-19 DIAGNOSIS — G4733 Obstructive sleep apnea (adult) (pediatric): Secondary | ICD-10-CM

## 2017-08-19 DIAGNOSIS — J4541 Moderate persistent asthma with (acute) exacerbation: Secondary | ICD-10-CM | POA: Diagnosis not present

## 2017-08-19 MED ORDER — NYSTATIN 100000 UNIT/ML MT SUSP: 5.0000 mL | Freq: Four times a day (QID) | OROMUCOSAL | 0 refills | Status: DC

## 2017-08-19 MED ORDER — ALBUTEROL SULFATE HFA 108 (90 BASE) MCG/ACT IN AERS: 2.0000 | INHALATION_SPRAY | Freq: Four times a day (QID) | RESPIRATORY_TRACT | 5 refills | Status: AC | PRN

## 2017-08-19 NOTE — Patient Instructions (Addendum)
Ventolin two puffs every 4 to 6 hours as needed for cough, wheeze, or chest congestion  Nystatin swish and swallow 5 ml four times per day for 5 days  Follow up in 6 months

## 2017-08-19 NOTE — Progress Notes (Signed)
[facility]  Chief Complaint  Patient presents with  . Follow-up    ROV     Constitutional: BP 102/64 (BP Location: Left Arm, Cuff Size: Normal)   Pulse 84   Ht 5' 8"  (1.727 m)   Wt 203 lb (92.1 kg)   SpO2 93%   BMI 30.87 kg/m   History of Present Illness: Gordon Cameron is a 83 y.o. male with obstructive sleep apnea, and asthma.  He is doing much better since his last visit with me.  Still has cough and voice gives out if he talks too much.  Not having wheeze, sputum, or fever.  Chest discomfort much improved.  Still gets fatigued easily.  Notices more chest congestion when out in hot weather and mowing lawn.  Using CPAP.  This helps.  Can't sleep w/o it.  No issue with mask fit.  He saw cardiology yesterday.    Comprehensive Respiratory Exam:  Appearance - well kempt  ENMT - nasal mucosa moist, turbinates clear, midline nasal septum, poor dentition lesions, no gingival bleeding, white build up posterior pharynx, no tonsillar hypertrophy Neck - no masses, trachea midline, no thyromegaly, no elevation in JVP Respiratory - normal appearance of chest wall, normal respiratory effort w/o accessory muscle use, no dullness on percussion, no wheezing or rales CV - s1s2 regular rate and rhythm, no murmurs, no peripheral edema, radial pulses symmetric GI - soft, non tender, no masses Lymph - no adenopathy noted in neck and axillary areas MSK - normal muscle strength and tone, normal gait Ext - no cyanosis, clubbing, or joint inflammation noted Skin - no rashes, lesions, or ulcers Neuro - oriented to person, place, and time Psych - normal mood and affect   Assessment/Plan:  Persistent asthma. - continue advair - add prn ventolin - discuss roles of different inhalers  Thrush. - will give course of nystatin - discussed options to help with rinsing mouth after using thrush  Obstructive sleep apnea. - he is compliant with CPAP and  reports benefit from therapy - continue auto CPAP  Pericardial effusion. - he is followed by cardiology   Patient Instructions  Ventolin two puffs every 4 to 6 hours as needed for cough, wheeze, or chest congestion  Nystatin swish and swallow 5 ml four times per day for 5 days  Follow up in 6 months    [name], MD [place] 08/19/2017, 5:05 PM  Flow Sheet  Pulmonary tests: CT angio chest 06/23/17 >> mod pericardial effusion PFT 07/03/17 >> FEV1 1.62 (64%), FEV1% 74, TLC 4.84 (73%), DLCO 41% Serology 07/07/17 >> ANA positive, 1:40, homogeneous pattern  Sleep tests: CPAP titration 06/17/06 ([facility]) >> CPAP 15 cm H2O. CPAP 08/11/11 to 09/09/11 >> Used on 23 of 30 nights with average 7 hrs 10 min. Average AHI 3.3 with CPAP 11 cm H2O. CPAP 11/22/12 to 12/21/12 >> used on 29 of 30 nights with average 6 hrs 36 min. Average AHI 4.3 with CPAP 11 cm H2O. CPAP 05/06/15 to 06/04/15 >> used on 30 of 30 nights with average 7 hrs and 18 min.  Average AHI is 3 with CPAP 11 cm H2O.  Cardiac tests: Echo 06/25/17 >> EF 50%, mild AR/MR, mild effusion, mild LVH Stress test 06/25/17 >> nl LV fx, nl wall motion  Past Medical History: He  has a past medical history of BPH (benign prostatic hyperplasia), Hyperlipidemia, OSA (obstructive sleep apnea), Reactive airway disease, and Restless leg.  Past  Surgical History: He  has a past surgical history that includes Neck surgery; Back surgery; Elbow surgery (Right); and Colonoscopy with propofol (N/A, 01/28/2017).  Family History: His family history includes Colon cancer in his brother, brother, and maternal grandmother.  Social History: He  reports that he has never smoked. He has never used smokeless tobacco. He reports that he does not drink alcohol or use drugs.  Medications: Allergies as of 08/19/2017   No Known Allergies     Medication List        Accurate as of 08/19/17  5:05 PM. Always use your most recent  med list.          albuterol 108 (90 Base) MCG/ACT inhaler Commonly known as:  VENTOLIN HFA Inhale 2 puffs into the lungs every 6 (six) hours as needed for wheezing or shortness of breath.   cetirizine 10 MG tablet Commonly known as:  ZYRTEC Take 10 mg by mouth daily as needed.   finasteride 5 MG tablet Commonly known as:  PROSCAR Once a day   fluticasone 50 MCG/ACT nasal spray Commonly known as:  FLONASE Place 2 sprays into both nostrils daily.   Fluticasone-Salmeterol 250-50 MCG/DOSE Aepb Commonly known as:  ADVAIR DISKUS Inhale 1 puff into the lungs 2 (two) times daily.   furosemide 20 MG tablet Commonly known as:  LASIX Take 20 mg by mouth daily.   metoprolol succinate 50 MG 24 hr tablet Commonly known as:  TOPROL-XL Take 25 mg by mouth daily. Take with or immediately following a meal.   nystatin 100000 UNIT/ML suspension Commonly known as:  MYCOSTATIN Take 5 mLs (500,000 Units total) by mouth 4 (four) times daily.   omeprazole 20 MG capsule Commonly known as:  PRILOSEC Take 20 mg by mouth daily.

## 2018-02-11 ENCOUNTER — Telehealth: Payer: Self-pay | Admitting: Pulmonary Disease

## 2018-02-11 NOTE — Telephone Encounter (Signed)
Patient called stating that he did not have SD card. He is not showing in air view.  Called and spoke with Sherlon Handing.  She stated that she would get download and fax to our office, via triage fax.  Patient's appointment is 02/12/18.  Returned call to Patient.  Left detailed message that [employer] was faxing download, and we would call if anything further is needed.

## 2018-02-12 ENCOUNTER — Ambulatory Visit (INDEPENDENT_AMBULATORY_CARE_PROVIDER_SITE_OTHER): Payer: Medicare Other | Admitting: Pulmonary Disease

## 2018-02-12 ENCOUNTER — Encounter: Payer: Self-pay | Admitting: Pulmonary Disease

## 2018-02-12 VITALS — BP 130/70 | HR 93 | Ht 68.0 in | Wt 205.8 lb

## 2018-02-12 DIAGNOSIS — J4520 Mild intermittent asthma, uncomplicated: Secondary | ICD-10-CM

## 2018-02-12 DIAGNOSIS — G4733 Obstructive sleep apnea (adult) (pediatric): Secondary | ICD-10-CM | POA: Diagnosis not present

## 2018-02-12 NOTE — Patient Instructions (Signed)
Follow up in 1 year.

## 2018-02-12 NOTE — Progress Notes (Signed)
[facility]  Chief Complaint  Patient presents with  . Follow-up    slight SOB - no cough.  stopped advair - doing good on CPAP    Constitutional:  BP 130/70 (BP Location: Right Arm, Patient Position: Sitting, Cuff Size: Normal)   Pulse 93   Ht 5' 8"  (1.727 m)   Wt 205 lb 12.8 oz (93.4 kg)   SpO2 96%   BMI 31.29 kg/m   Past Medical History:  RLS, Pericardial effusion, HLD, BPH  Brief Summary:  Gordon Cameron is a 84 y.o. male with obstructive sleep apnea and asthma.  Breathing has been doing better.  Not needing advair.  This made his voice hoarse.  Uses albuterol few times per month.  Tried going off lasix and leg swelling increased.  Started back again.  Uses CPAP most nights.  No issues with mask fit or pressure settings.   Physical Exam:   Appearance - well kempt   ENMT - clear nasal mucosa, midline nasal  septum, no oral exudates, no LAN, trachea midline, wears dentures  Respiratory - normal chest wall, normal respiratory effort, no accessory muscle use, no wheeze/rales  CV - s1s2 regular rate and rhythm, no murmurs, no peripheral edema, radial pulses symmetric  GI - soft, non tender, no masses  Lymph - no adenopathy noted in neck and axillary areas  MSK - normal gait  Ext - no cyanosis, clubbing, or joint inflammation noted  Skin - no rashes, lesions, or ulcers  Neuro - normal strength, oriented x 3  Psych - normal mood and affect   Assessment/Plan:   Mild, intermittent asthma. - much of his previous symptoms likely from pulmonary edema in setting of pericardial effusion - advair causes hoarseness - prn ventolin  Obstructive sleep apnea. - he is compliant with CPAP and reports benefit - continue auto CPAP  Pericardial effusion. - most recent Echo is improved   Patient Instructions  Follow up in 1 year    [name], MD [place] Pager: [phone] 02/12/2018, 10:30  AM  Flow Sheet     Pulmonary tests:  CT angio chest 06/23/17 >> mod pericardial effusion PFT 07/03/17 >> FEV1 1.62 (64%), FEV1% 74, TLC 4.84 (73%), DLCO 41% Serology 07/07/17 >> ANA positive, 1:40, homogeneous pattern  Sleep tests:  CPAP titration 06/17/06 ([facility]) >> CPAP 15 cm H2O. Auto CPAP 11/11/17 to 02/08/18 >> used on 69 of 90 nights with average 7 hrs.  Average AHI 12.9 with median CPAP 11 and 95 th percentile CPAP 13 cm H2O   Cardiac tests:  Echo 06/25/17 >> EF 50%, mild AR/MR, mild effusion, mild LVH Stress test 06/25/17 >> nl LV fx, nl wall motion  Medications:   Allergies as of 02/12/2018   No Known Allergies     Medication List       Accurate as of February 12, 2018 10:30 AM. Always use your most recent med list.        albuterol 108 (90 Base) MCG/ACT inhaler Commonly known as:  VENTOLIN HFA Inhale 2 puffs into the lungs every 6 (six) hours as needed for wheezing or shortness of breath.   cetirizine 10 MG tablet Commonly known as:  ZYRTEC Take 10 mg by mouth daily as needed.   finasteride 5 MG tablet Commonly known as:  PROSCAR Once a day   fluticasone 50 MCG/ACT nasal spray Commonly known as:  FLONASE Place 2 sprays into both nostrils daily.  furosemide 20 MG tablet Commonly known as:  LASIX Take 20 mg by mouth daily.   omeprazole 20 MG capsule Commonly known as:  PRILOSEC Take 20 mg by mouth daily.       Past Surgical History:  He  has a past surgical history that includes Neck surgery; Back surgery; Elbow surgery (Right); and Colonoscopy with propofol (N/A, 01/28/2017).  Family History:  His family history includes Colon cancer in his brother, brother, and maternal grandmother.  Social History:  He  reports that he has never smoked. He has never used smokeless tobacco. He reports that he does not drink alcohol or use drugs.

## 2018-03-15 ENCOUNTER — Telehealth: Payer: Self-pay | Admitting: Pulmonary Disease

## 2018-03-15 NOTE — Telephone Encounter (Signed)
I am okay with him switching.  He could see any of the providers in [place].  However, Dr. Ashby Dawes also specializes in sleep medicine.  So he might want to chose Dr. Ashby Dawes to help manage his sleep apnea.

## 2018-03-15 NOTE — Telephone Encounter (Signed)
Spoke with pt, he states he wanted to switch to a pulmonary doctor in [place] because it is getting harder to travel all the way to [place]. He request Dr. Halford Chessman pick the doctor he thinks he should see. Please advise.

## 2018-03-16 NOTE — Telephone Encounter (Signed)
Called patient at number provided. Phone rang once and went to voicemail. Left a message requesting the patient to give [place] a call back.

## 2018-03-16 NOTE — Telephone Encounter (Signed)
Pt is calling back [phone]

## 2018-03-16 NOTE — Telephone Encounter (Signed)
Spoke with patient. He is aware of VS' recommendations. Per his last AVS, he was advised to follow up in 1 year. Patient did not want to wait a year and wanted an appt with the next week. He has been scheduled to see PR at [address] in [place]. He is aware of the address.   Nothing further needed at time of call.

## 2018-03-23 ENCOUNTER — Encounter: Payer: Self-pay | Admitting: Internal Medicine

## 2018-03-23 ENCOUNTER — Ambulatory Visit (INDEPENDENT_AMBULATORY_CARE_PROVIDER_SITE_OTHER): Payer: Medicare Other | Admitting: Internal Medicine

## 2018-03-23 VITALS — BP 122/80 | HR 88 | Ht 68.0 in | Wt 208.6 lb

## 2018-03-23 DIAGNOSIS — J449 Chronic obstructive pulmonary disease, unspecified: Secondary | ICD-10-CM

## 2018-03-23 DIAGNOSIS — G4733 Obstructive sleep apnea (adult) (pediatric): Secondary | ICD-10-CM

## 2018-03-23 MED ORDER — TIOTROPIUM BROMIDE MONOHYDRATE 18 MCG IN CAPS: 18.0000 ug | ORAL_CAPSULE | Freq: Every day | RESPIRATORY_TRACT | 2 refills | Status: DC

## 2018-03-23 NOTE — Patient Instructions (Addendum)
Will start spiriva inhaler one puff once per day.  Continue rescue inhaler as needed. Will refer to pulmonary rehab. Continue CPAP use nightly.

## 2018-03-23 NOTE — Progress Notes (Signed)
* [facility]     Assessment and Plan:  Dyspnea on exertion. - Patient is experiencing a progressive decline in functional status, which I suspect is multifactorial due to cardiac, pulmonary disease, deconditioning/debility.  Patient is at high risk of exacerbations/complications due to declining respiratory status. - We will refer to pulmonary rehab.  COPD/chronic bronchitis. - Patient developed significant thrush with inhaled steroids, no significant improvement with Advair. - We will start Spiriva once daily.  Obstructive sleep apnea. - Doing well with CPAP, got a new machine and 2019. - Continue nightly CPAP use.  Meds ordered this encounter  Medications  . tiotropium (SPIRIVA HANDIHALER) 18 MCG inhalation capsule    Sig: Place 1 capsule (18 mcg total) into inhaler and inhale daily.    Dispense:  30 capsule    Refill:  2   Orders Placed This Encounter  Procedures  . AMB referral to pulmonary rehabilitation   Return in about 6 months (around 09/23/2018).    Date: 03/23/2018  MRN# [id number] Gordon Cameron 01-17-1935   [name] is a 84 y.o. old male seen in follow up for chief complaint of  Chief Complaint  Patient presents with  . sleep consult    switching from [facility] office. pt reports of sob with exertion and with bending, non prod cough & increased fatigue. wearing cpap avg 7-8hr, feels pressure & mask are okay. YHC:[id number].      HPI:  Gordon Cameron is a 84 y.o. male with obstructive sleep apnea, and asthma, positive ANA.  Patient was previously being seen up in [place] but is switching his care down to [place].  Lissa Merlin last visit he was asked to continue using Advair, CPAP.  He was given a course of nystatin for oral thrush.  He was initially being followed for OSA, but last year his breathing declined and he was thought to have possible asthma. He feels that his breathing is progressively worsening slowly over the past year and  really over the past few weeks.  He was initially on advair for about 2 months and was not helping and he lost his voice which came back when he stopped it after some dental work. He has albuterol that he uses rarely. His breathing is worse when he bends over.  He has gained about 15 pounds, and was having leg swelling. He was then put back on lasix.  He has not gone to cardiac rehab or pulm rehab.  He sees [facility], EF of 50% with percardial effusion.   He has never smoked, his father was a heavy smoker. 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   Greater than 50% of the 40 minute visit was spent in counseling/coordination of care regarding dyspnea.    **CT chest 06/23/17>>images personally review, normal lungs, mild pericardial effusion.  **CBC 06/23/17>> Abs Eos 800.  **PFT 07/03/2017 tracings personally reviewed, FVC is 61% predicted, FEV1 is 57% predicted, there is no significant improvement with bronchodilator.  Ratio is 66%.  Flow volume loop is unremarkable.  TLC is 73% predicted, ratio is 109%.  DLCO is reduced at 41%. Overall this test shows moderate obstructive lung disease with FEV1 of 57%.  There is also evidence of restriction with reduced FVC.   Medication:    Current Outpatient Medications:  .  albuterol (VENTOLIN HFA) 108 (90 Base) MCG/ACT inhaler, Inhale 2 puffs into the lungs every 6 (six) hours as needed for wheezing or shortness of  breath., Disp: 1 Inhaler, Rfl: 5 .  cetirizine (ZYRTEC) 10 MG tablet, Take 10 mg by mouth daily as needed. , Disp: , Rfl:  .  finasteride (PROSCAR) 5 MG tablet, Once a day, Disp: , Rfl:  .  fluticasone (FLONASE) 50 MCG/ACT nasal spray, Place 2 sprays into both nostrils daily., Disp: , Rfl:  .  furosemide (LASIX) 20 MG tablet, Take 40 mg by mouth daily. , Disp: , Rfl: 2   Allergies:  Patient has no known allergies.      LABORATORY PANEL:   CBC No results for input(s): WBC, HGB, HCT, PLT in the last 168  hours. ------------------------------------------------------------------------------------------------------------------  Chemistries  No results for input(s): NA, K, CL, CO2, GLUCOSE, BUN, CREATININE, CALCIUM, MG, AST, ALT, ALKPHOS, BILITOT in the last 168 hours.  Invalid input(s): GFRCGP ------------------------------------------------------------------------------------------------------------------  Cardiac Enzymes No results for input(s): TROPONINI in the last 168 hours. ------------------------------------------------------------  RADIOLOGY:   No results found for this or any previous visit. Results for orders placed during the hospital encounter of 07/06/17  DG Chest 2 View   Narrative CLINICAL DATA:  Shortness of breath, cough, weakness.  EXAM: CHEST - 2 VIEW  COMPARISON:  CT chest 06/23/2017.  FINDINGS: Trachea is midline. Heart size normal. Lungs are somewhat low in volume with tiny bilateral pleural effusions.  IMPRESSION: Tiny bilateral pleural effusions.   Electronically Signed   By: Lorin Picket M.D.   On: 07/06/2017 15:19    ------------------------------------------------------------------------------------------------------------------  Thank  you for allowing [facility] to assist in the care of your patient. Our recommendations are noted above.  Please contact us if we can be of further service.   Marda Stalker, M.D., F.C.C.P.  Board Certified in Internal Medicine, Pulmonary Medicine, [place], and Sleep Medicine.  [facility] Office Number: [phone]  03/23/2018

## 2018-03-30 ENCOUNTER — Other Ambulatory Visit: Payer: Self-pay

## 2018-03-30 ENCOUNTER — Encounter: Payer: Medicare Other | Attending: Internal Medicine

## 2018-03-30 VITALS — Ht 68.0 in | Wt 205.6 lb

## 2018-03-30 DIAGNOSIS — G2581 Restless legs syndrome: Secondary | ICD-10-CM | POA: Diagnosis not present

## 2018-03-30 DIAGNOSIS — R0602 Shortness of breath: Secondary | ICD-10-CM | POA: Diagnosis not present

## 2018-03-30 DIAGNOSIS — G4733 Obstructive sleep apnea (adult) (pediatric): Secondary | ICD-10-CM | POA: Diagnosis not present

## 2018-03-30 DIAGNOSIS — E785 Hyperlipidemia, unspecified: Secondary | ICD-10-CM | POA: Insufficient documentation

## 2018-03-30 DIAGNOSIS — Z79899 Other long term (current) drug therapy: Secondary | ICD-10-CM | POA: Diagnosis not present

## 2018-03-30 DIAGNOSIS — J45909 Unspecified asthma, uncomplicated: Secondary | ICD-10-CM | POA: Diagnosis not present

## 2018-03-30 DIAGNOSIS — J449 Chronic obstructive pulmonary disease, unspecified: Secondary | ICD-10-CM

## 2018-03-30 NOTE — Progress Notes (Signed)
Daily Session Note  Patient Details  Name: Gordon Cameron MRN: [id number] Date of Birth: Aug 12, 1934 Referring Provider:     Pulmonary Rehab from 03/30/2018 in [facility]  Referring Provider  Ashby Dawes      Encounter Date: 03/30/2018  Check In: Session Check In - 03/30/18 1056      Check-In   Supervising physician immediately available to respond to emergencies  [facility] immediately available ER MD    Physician(s)  Dr. Quentin Cornwall and [facility]    Location  [facility]-Cardiac & Pulmonary Rehab    Staff Present  Justin Mend RCP,RRT,BSRT;Amanda Oletta Darter, [place], ACSM CEP, Exercise Physiologist    Medication changes reported      No    Fall or balance concerns reported     No    Warm-up and Cool-down  Not performed (comment)   medical evaluation   Resistance Training Performed  No    VAD Patient?  No    PAD/SET Patient?  No      Pain Assessment   Currently in Pain?  No/denies          Social History   Tobacco Use  Smoking Status Never Smoker  Smokeless Tobacco Never Used    Goals Met:  Exercise tolerated well Personal goals reviewed No report of cardiac concerns or symptoms Strength training completed today  Goals Unmet:  Not Applicable  Comments:  6 Minute Walk    Row Name 03/30/18 1125         6 Minute Walk   Phase  Initial     Distance  1270 feet     Walk Time  6 minutes     # of Rest Breaks  0     MPH  2.4     METS  2.3     RPE  13     Perceived Dyspnea   2     VO2 Peak  8.03     Symptoms  No     Resting HR  87 bpm     Resting BP  108/64     Resting Oxygen Saturation   94 %     Exercise Oxygen Saturation  during 6 min walk  94 %     Max Ex. HR  120 bpm     Max Ex. BP  138/76     2 Minute Post BP  124/68       Interval HR   1 Minute HR  104     2 Minute HR  109     4 Minute HR  116     5 Minute HR  118     6 Minute HR  120     2 Minute Post HR  99     Interval Heart Rate?  Yes       Interval Oxygen   Interval Oxygen?   Yes     Baseline Oxygen Saturation %  94 %     1 Minute Oxygen Saturation %  94 %     1 Minute Liters of Oxygen  0 L     2 Minute Oxygen Saturation %  95 %     2 Minute Liters of Oxygen  0 L     3 Minute Liters of Oxygen  0 L     4 Minute Oxygen Saturation %  95 %     4 Minute Liters of Oxygen  0 L     5 Minute Oxygen Saturation %  94 %     5 Minute Liters of Oxygen  0 L     6 Minute Oxygen Saturation %  95 %     6 Minute Liters of Oxygen  0 L     2 Minute Post Oxygen Saturation %  96 %     2 Minute Post Liters of Oxygen  0 L      Service Time 1040-1130   Dr. Emily Filbert is Medical Director for [place] and [facility].

## 2018-03-30 NOTE — Patient Instructions (Signed)
Patient Instructions  Patient Details  Name: Gordon Cameron MRN: [id number] Date of Birth: 06-Dec-1934 Referring Provider:  Laverle Hobby, * Below are your personal goals for exercise, nutrition, and risk factors. Our goal is to help you stay on track towards obtaining and maintaining these goals. We will be discussing your progress on these goals with you throughout the program. Initial Exercise Prescription: Initial Exercise Prescription - 03/30/18 1100      Date of Initial Exercise RX and Referring Provider   Date  03/30/18    Referring Provider  Ramachandran      Treadmill   MPH  1.7    Grade  0    Minutes  15    METs  2.3      Recumbant Bike   Level  2    RPM  60    Watts  10    Minutes  15    METs  2.3      NuStep   Level  2    SPM  80    Minutes  15    METs  2.3      Prescription Details   Frequency (times per week)  3    Duration  Progress to 45 minutes of aerobic exercise without signs/symptoms of physical distress      Intensity   THRR 40-80% of Max Heartrate  108-129    Ratings of Perceived Exertion  11-13    Perceived Dyspnea  0-4      Resistance Training   Training Prescription  Yes    Weight  3 lb    Reps  10-15      Exercise Goals: Frequency: Be able to perform aerobic exercise two to three times per week in program working toward 2-5 days per week of home exercise. Intensity: Work with a perceived exertion of 11 (fairly light) - 15 (hard) while following your exercise prescription.  We will make changes to your prescription with you as you progress through the program. Duration: Be able to do 30 to 45 minutes of continuous aerobic exercise in addition to a 5 minute warm-up and a 5 minute cool-down routine. Nutrition Goals: Your personal nutrition goals will be established when you do your nutrition analysis with the dietician. The following are general nutrition guidelines to follow: Cholesterol < 200mg /day Sodium < 1500mg /day Fiber:  Men over 50 yrs - 30 grams per day Personal Goals: Personal Goals and Risk Factors at Admission - 03/30/18 1126      Core Components/Risk Factors/Patient Goals on Admission    Weight Management  Yes;Weight Loss    Intervention  Weight Management: Develop a combined nutrition and exercise program designed to reach desired caloric intake, while maintaining appropriate intake of nutrient and fiber, sodium and fats, and appropriate energy expenditure required for the weight goal.;Weight Management: Provide education and appropriate resources to help participant work on and attain dietary goals.    Admit Weight  205 lb (93 kg)    Goal Weight: Short Term  200 lb (90.7 kg)    Goal Weight: Long Term  190 lb (86.2 kg)    Expected Outcomes  Long Term: Adherence to nutrition and physical activity/exercise program aimed toward attainment of established weight goal;Short Term: Continue to assess and modify interventions until short term weight is achieved;Weight Loss: Understanding of general recommendations for a balanced deficit meal plan, which promotes 1-2 lb weight loss per week and includes a negative energy balance of [id number] kcal/d;Understanding recommendations for meals to  include 15-35% energy as protein, 25-35% energy from fat, 35-60% energy from carbohydrates, less than 200mg  of dietary cholesterol, 20-35 gm of total fiber daily;Understanding of distribution of calorie intake throughout the day with the consumption of 4-5 meals/snacks;Weight Maintenance: Understanding of the daily nutrition guidelines, which includes 25-35% calories from fat, 7% or less cal from saturated fats, less than 200mg  cholesterol, less than 1.5gm of sodium, & 5 or more servings of fruits and vegetables daily    Improve shortness of breath with ADL's  Yes    Intervention  Provide education, individualized exercise plan and daily activity instruction to help decrease symptoms of SOB with activities of daily living.    Expected  Outcomes  Short Term: Improve cardiorespiratory fitness to achieve a reduction of symptoms when performing ADLs;Long Term: Be able to perform more ADLs without symptoms or delay the onset of symptoms      Tobacco Use Initial Evaluation: Social History   Tobacco Use  Smoking Status Never Smoker  Smokeless Tobacco Never Used   Exercise Goals and Review: Exercise Goals    Row Name 03/30/18 1125             Exercise Goals   Increase Physical Activity  Yes       Intervention  Provide advice, education, support and counseling about physical activity/exercise needs.;Develop an individualized exercise prescription for aerobic and resistive training based on initial evaluation findings, risk stratification, comorbidities and participant's personal goals.       Expected Outcomes  Short Term: Attend rehab on a regular basis to increase amount of physical activity.;Long Term: Add in home exercise to make exercise part of routine and to increase amount of physical activity.;Long Term: Exercising regularly at least 3-5 days a week.       Increase Strength and Stamina  Yes       Intervention  Provide advice, education, support and counseling about physical activity/exercise needs.;Develop an individualized exercise prescription for aerobic and resistive training based on initial evaluation findings, risk stratification, comorbidities and participant's personal goals.       Expected Outcomes  Short Term: Increase workloads from initial exercise prescription for resistance, speed, and METs.;Short Term: Perform resistance training exercises routinely during rehab and add in resistance training at home;Long Term: Improve cardiorespiratory fitness, muscular endurance and strength as measured by increased METs and functional capacity (6MWT)       Able to understand and use rate of perceived exertion (RPE) scale  Yes       Intervention  Provide education and explanation on how to use RPE scale       Expected  Outcomes  Short Term: Able to use RPE daily in rehab to express subjective intensity level;Long Term:  Able to use RPE to guide intensity level when exercising independently       Able to understand and use Dyspnea scale  Yes       Intervention  Provide education and explanation on how to use Dyspnea scale       Expected Outcomes  Short Term: Able to use Dyspnea scale daily in rehab to express subjective sense of shortness of breath during exertion;Long Term: Able to use Dyspnea scale to guide intensity level when exercising independently       Knowledge and understanding of Target Heart Rate Range (THRR)  Yes       Intervention  Provide education and explanation of THRR including how the numbers were predicted and where they are located for reference  Expected Outcomes  Short Term: Able to state/look up THRR;Short Term: Able to use daily as guideline for intensity in rehab;Long Term: Able to use THRR to govern intensity when exercising independently       Able to check pulse independently  Yes       Intervention  Provide education and demonstration on how to check pulse in carotid and radial arteries.;Review the importance of being able to check your own pulse for safety during independent exercise       Expected Outcomes  Short Term: Able to explain why pulse checking is important during independent exercise;Long Term: Able to check pulse independently and accurately       Understanding of Exercise Prescription  Yes       Intervention  Provide education, explanation, and written materials on patient's individual exercise prescription       Expected Outcomes  Short Term: Able to explain program exercise prescription;Long Term: Able to explain home exercise prescription to exercise independently         Copy of goals given to participant.

## 2018-03-30 NOTE — Progress Notes (Signed)
Pulmonary Individual Treatment Plan  Patient Details  Name: Gordon Cameron MRN: [id number] Date of Birth: [birth year] Referring Provider:     Pulmonary Rehab from 03/30/2018 in [facility]  Referring Provider  Ramachandran      Initial Encounter Date:    Pulmonary Rehab from 03/30/2018 in [facility] and Pul[facility] Rehab  Date  03/30/18      Visit Diagnosis: Chronic obstructive pulmonary disease, unspecified COPD type ([place])  Patient's Home Medications on Admission:  Current Outpatient Medications:  .  albuterol (VENTOLIN HFA) 108 (90 Base) MCG/ACT inhaler, Inhale 2 puffs into the lungs every 6 (six) hours as needed for wheezing or shortness of breath., Disp: 1 Inhaler, Rfl: 5 .  cetirizine (ZYRTEC) 10 MG tablet, Take 10 mg by mouth daily as needed. , Disp: , Rfl:  .  finasteride (PROSCAR) 5 MG tablet, Once a day, Disp: , Rfl:  .  fluticasone (FLONASE) 50 MCG/ACT nasal spray, Place 2 sprays into both nostrils daily., Disp: , Rfl:  .  furosemide (LASIX) 20 MG tablet, Take 40 mg by mouth daily. , Disp: , Rfl: 2 .  tiotropium (SPIRIVA HANDIHALER) 18 MCG inhalation capsule, Place 1 capsule (18 mcg total) into inhaler and inhale daily., Disp: 30 capsule, Rfl: 2  Past Medical History: Past Medical History:  Diagnosis Date  . BPH (benign prostatic hyperplasia)   . Hyperlipidemia   . OSA (obstructive sleep apnea)   . Reactive airway disease   . Restless leg     Tobacco Use: Social History   Tobacco Use  Smoking Status Never Smoker  Smokeless Tobacco Never Used    Labs: Recent Review Flowsheet Data    There is no flowsheet data to display.       Pulmonary Assessment Scores: Pulmonary Assessment Scores    Row Name 03/30/18 1059         ADL UCSD   ADL Phase  Entry     SOB Score total  27     Rest  0     Walk  1     Stairs  3     Bath  1     Dress  2     Shop  1       CAT Score   CAT Score  8       mMRC Score   mMRC Score  2         Pulmonary Function Assessment: Pulmonary Function Assessment - 03/30/18 1100      Initial Spirometry Results   FVC%  61 %    FEV1%  57 %    FEV1/FVC Ratio  66    Comments  Test done on 07/03/2017      Post Bronchodilator Spirometry Results   FVC%  61 %    FEV1%  64 %    FEV1/FVC Ratio  74    Comments  Test done on 07/03/2017      Breath   Bilateral Breath Sounds  Clear    Shortness of Breath  Yes;Limiting activity       Exercise Target Goals: Exercise Program Goal: Individual exercise prescription set using results from initial 6 min walk test and THRR while considering  patient's activity barriers and safety.   Exercise Prescription Goal: Initial exercise prescription builds to 30-45 minutes a day of aerobic activity, 2-3 days per week.  Home exercise guidelines will be given to patient during program as part of exercise prescription that the participant will  acknowledge.  Activity Barriers & Risk Stratification:   6 Minute Walk: 6 Minute Walk    Row Name 03/30/18 1125         6 Minute Walk   Phase  Initial     Distance  1270 feet     Walk Time  6 minutes     # of Rest Breaks  0     MPH  2.4     METS  2.3     RPE  13     Perceived Dyspnea   2     VO2 Peak  8.03     Symptoms  No     Resting HR  87 bpm     Resting BP  108/64     Resting Oxygen Saturation   94 %     Exercise Oxygen Saturation  during 6 min walk  94 %     Max Ex. HR  120 bpm     Max Ex. BP  138/76     2 Minute Post BP  124/68       Interval HR   1 Minute HR  104     2 Minute HR  109     4 Minute HR  116     5 Minute HR  118     6 Minute HR  120     2 Minute Post HR  99     Interval Heart Rate?  Yes       Interval Oxygen   Interval Oxygen?  Yes     Baseline Oxygen Saturation %  94 %     1 Minute Oxygen Saturation %  94 %     1 Minute Liters of Oxygen  0 L     2 Minute Oxygen Saturation %  95 %     2 Minute Liters of Oxygen  0 L     3 Minute Liters of Oxygen  0 L     4 Minute  Oxygen Saturation %  95 %     4 Minute Liters of Oxygen  0 L     5 Minute Oxygen Saturation %  94 %     5 Minute Liters of Oxygen  0 L     6 Minute Oxygen Saturation %  95 %     6 Minute Liters of Oxygen  0 L     2 Minute Post Oxygen Saturation %  96 %     2 Minute Post Liters of Oxygen  0 L       Oxygen Initial Assessment: Oxygen Initial Assessment - 03/30/18 1119      Home Oxygen   Home Oxygen Device  None    Sleep Oxygen Prescription  CPAP    Liters per minute  0    Home Exercise Oxygen Prescription  None    Home at Rest Exercise Oxygen Prescription  None    Compliance with Home Oxygen Use  Yes      Initial 6 min Walk   Oxygen Used  None      Program Oxygen Prescription   Program Oxygen Prescription  None      Intervention   Short Term Goals  To learn and demonstrate proper use of respiratory medications;To learn and demonstrate proper pursed lip breathing techniques or other breathing techniques.;To learn and understand importance of maintaining oxygen saturations>88%;To learn and understand importance of monitoring SPO2 with pulse oximeter and demonstrate accurate use of the pulse oximeter.  Long  Term Goals  Verbalizes importance of monitoring SPO2 with pulse oximeter and return demonstration;Maintenance of O2 saturations>88%;Exhibits proper breathing techniques, such as pursed lip breathing or other method taught during program session;Compliance with respiratory medication;Demonstrates proper use of MDI's       Oxygen Re-Evaluation:   Oxygen Discharge (Final Oxygen Re-Evaluation):   Initial Exercise Prescription: Initial Exercise Prescription - 03/30/18 1100      Date of Initial Exercise RX and Referring Provider   Date  03/30/18    Referring Provider  Ramachandran      Treadmill   MPH  1.7    Grade  0    Minutes  15    METs  2.3      Recumbant Bike   Level  2    RPM  60    Watts  10    Minutes  15    METs  2.3      NuStep   Level  2    SPM  80     Minutes  15    METs  2.3      Prescription Details   Frequency (times per week)  3    Duration  Progress to 45 minutes of aerobic exercise without signs/symptoms of physical distress      Intensity   THRR 40-80% of Max Heartrate  108-129    Ratings of Perceived Exertion  11-13    Perceived Dyspnea  0-4      Resistance Training   Training Prescription  Yes    Weight  3 lb    Reps  10-15       Perform Capillary Blood Glucose checks as needed.  Exercise Prescription Changes:   Exercise Comments:   Exercise Goals and Review: Exercise Goals    Row Name 03/30/18 1125             Exercise Goals   Increase Physical Activity  Yes       Intervention  Provide advice, education, support and counseling about physical activity/exercise needs.;Develop an individualized exercise prescription for aerobic and resistive training based on initial evaluation findings, risk stratification, comorbidities and participant's personal goals.       Expected Outcomes  Short Term: Attend rehab on a regular basis to increase amount of physical activity.;Long Term: Add in home exercise to make exercise part of routine and to increase amount of physical activity.;Long Term: Exercising regularly at least 3-5 days a week.       Increase Strength and Stamina  Yes       Intervention  Provide advice, education, support and counseling about physical activity/exercise needs.;Develop an individualized exercise prescription for aerobic and resistive training based on initial evaluation findings, risk stratification, comorbidities and participant's personal goals.       Expected Outcomes  Short Term: Increase workloads from initial exercise prescription for resistance, speed, and METs.;Short Term: Perform resistance training exercises routinely during rehab and add in resistance training at home;Long Term: Improve cardiorespiratory fitness, muscular endurance and strength as measured by increased METs and  functional capacity (6MWT)       Able to understand and use rate of perceived exertion (RPE) scale  Yes       Intervention  Provide education and explanation on how to use RPE scale       Expected Outcomes  Short Term: Able to use RPE daily in rehab to express subjective intensity level;Long Term:  Able to use RPE to guide intensity level when exercising  independently       Able to understand and use Dyspnea scale  Yes       Intervention  Provide education and explanation on how to use Dyspnea scale       Expected Outcomes  Short Term: Able to use Dyspnea scale daily in rehab to express subjective sense of shortness of breath during exertion;Long Term: Able to use Dyspnea scale to guide intensity level when exercising independently       Knowledge and understanding of Target Heart Rate Range (THRR)  Yes       Intervention  Provide education and explanation of THRR including how the numbers were predicted and where they are located for reference       Expected Outcomes  Short Term: Able to state/look up THRR;Short Term: Able to use daily as guideline for intensity in rehab;Long Term: Able to use THRR to govern intensity when exercising independently       Able to check pulse independently  Yes       Intervention  Provide education and demonstration on how to check pulse in carotid and radial arteries.;Review the importance of being able to check your own pulse for safety during independent exercise       Expected Outcomes  Short Term: Able to explain why pulse checking is important during independent exercise;Long Term: Able to check pulse independently and accurately       Understanding of Exercise Prescription  Yes       Intervention  Provide education, explanation, and written materials on patient's individual exercise prescription       Expected Outcomes  Short Term: Able to explain program exercise prescription;Long Term: Able to explain home exercise prescription to exercise independently           Exercise Goals Re-Evaluation :   Discharge Exercise Prescription (Final Exercise Prescription Changes):   Nutrition:  Target Goals: Understanding of nutrition guidelines, daily intake of sodium '1500mg'$ , cholesterol '200mg'$ , calories 30% from fat and 7% or less from saturated fats, daily to have 5 or more servings of fruits and vegetables.  Biometrics: Pre Biometrics - 03/30/18 1124      Pre Biometrics   Height  '5\' 8"'$  (1.727 m)    Weight  205 lb 9.6 oz (93.3 kg)    Waist Circumference  40 inches    Hip Circumference  42 inches    Waist to Hip Ratio  0.95 %    BMI (Calculated)  31.27        Nutrition Therapy Plan and Nutrition Goals: Nutrition Therapy & Goals - 03/30/18 1124      Personal Nutrition Goals   Nutrition Goal  He wants to lose weight.    Personal Goal #2  Eat healthier with his new teeth. find foods that he is able to eat.    Personal Goal #3  Increase appetite    Comments  He has new teeth and it is hard for him to eat sometimes and his diet has changed alot.      Intervention Plan   Intervention  Prescribe, educate and counsel regarding individualized specific dietary modifications aiming towards targeted core components such as weight, hypertension, lipid management, diabetes, heart failure and other comorbidities.;Nutrition handout(s) given to patient.    Expected Outcomes  Short Term Goal: Understand basic principles of dietary content, such as calories, fat, sodium, cholesterol and nutrients.;Long Term Goal: Adherence to prescribed nutrition plan.       Nutrition Assessments:   Nutrition Goals Re-Evaluation:  Nutrition Goals Discharge (Final Nutrition Goals Re-Evaluation):   Psychosocial: Target Goals: Acknowledge presence or absence of significant depression and/or stress, maximize coping skills, provide positive support system. Participant is able to verbalize types and ability to use techniques and skills needed for reducing stress and  depression.   Initial Review & Psychosocial Screening: Initial Psych Review & Screening - 03/30/18 1122      Initial Review   Current issues with  Current Stress Concerns    Source of Stress Concerns  Chronic Illness    Comments  He is not able to sleep well at night and is having trouble doing the things he wants to do outside.      Family Dynamics   Good Support System?  Yes    Comments  He has his wife, daughter and son.      Barriers   Psychosocial barriers to participate in program  The patient should benefit from training in stress management and relaxation.      Screening Interventions   Interventions  Provide feedback about the scores to participant;To provide support and resources with identified psychosocial needs;Program counselor consult;Encouraged to exercise    Expected Outcomes  Long Term Goal: Stressors or current issues are controlled or eliminated.;Short Term goal: Utilizing psychosocial counselor, staff and physician to assist with identification of specific Stressors or current issues interfering with healing process. Setting desired goal for each stressor or current issue identified.;Short Term goal: Identification and review with participant of any Quality of Life or Depression concerns found by scoring the questionnaire.;Long Term goal: The participant improves quality of Life and PHQ9 Scores as seen by post scores and/or verbalization of changes       Quality of Life Scores:  Scores of 19 and below usually indicate a poorer quality of life in these areas.  A difference of  2-3 points is a clinically meaningful difference.  A difference of 2-3 points in the total score of the Quality of Life Index has been associated with significant improvement in overall quality of life, self-image, physical symptoms, and general health in studies assessing change in quality of life.  PHQ-9: Recent Review Flowsheet Data    Depression screen [facility] 2/9 03/30/2018   Decreased Interest  0   Down, Depressed, Hopeless 0   PHQ - 2 Score 0   Altered sleeping 1   Tired, decreased energy 1   Change in appetite 1   Feeling bad or failure about yourself  0   Trouble concentrating 0   Moving slowly or fidgety/restless 0   Suicidal thoughts 0   PHQ-9 Score 3   Difficult doing work/chores Not difficult at all     Interpretation of Total Score  Total Score Depression Severity:  1-4 = Minimal depression, 5-9 = Mild depression, 10-14 = Moderate depression, 15-19 = Moderately severe depression, 20-27 = Severe depression   Psychosocial Evaluation and Intervention:   Psychosocial Re-Evaluation:   Psychosocial Discharge (Final Psychosocial Re-Evaluation):   Education: Education Goals: Education classes will be provided on a weekly basis, covering required topics. Participant will state understanding/return demonstration of topics presented.  Learning Barriers/Preferences: Learning Barriers/Preferences - 03/30/18 1126      Learning Barriers/Preferences   Learning Barriers  Sight   wears glasses   Learning Preferences  None       Education Topics:  Initial Evaluation Education: - Verbal, written and demonstration of respiratory meds, oximetry and breathing techniques. Instruction on use of nebulizers and MDIs and importance of monitoring MDI  activations.   Pulmonary Rehab from 03/30/2018 in [facility]  Date  03/30/18  Educator  [facility]  Instruction Review Code  1- Verbalizes Understanding      General Nutrition Guidelines/Fats and Fiber: -Group instruction provided by verbal, written material, models and posters to present the general guidelines for heart healthy nutrition. Gives an explanation and review of dietary fats and fiber.   Controlling Sodium/Reading Food Labels: -Group verbal and written material supporting the discussion of sodium use in heart healthy nutrition. Review and explanation with models, verbal and written materials for  utilization of the food label.   Exercise Physiology & General Exercise Guidelines: - Group verbal and written instruction with models to review the exercise physiology of the cardiovascular system and associated critical values. Provides general exercise guidelines with specific guidelines to those with heart or lung disease.    Aerobic Exercise & Resistance Training: - Gives group verbal and written instruction on the various components of exercise. Focuses on aerobic and resistive training programs and the benefits of this training and how to safely progress through these programs.   Flexibility, Balance, Mind/Body Relaxation: Provides group verbal/written instruction on the benefits of flexibility and balance training, including mind/body exercise modes such as yoga, pilates and tai chi.  Demonstration and skill practice provided.   Stress and Anxiety: - Provides group verbal and written instruction about the health risks of elevated stress and causes of high stress.  Discuss the correlation between heart/lung disease and anxiety and treatment options. Review healthy ways to manage with stress and anxiety.   Depression: - Provides group verbal and written instruction on the correlation between heart/lung disease and depressed mood, treatment options, and the stigmas associated with seeking treatment.   Exercise & Equipment Safety: - Individual verbal instruction and demonstration of equipment use and safety with use of the equipment.   Pulmonary Rehab from 03/30/2018 in [facility]  Date  03/30/18  Educator  [facility]  Instruction Review Code  1- Verbalizes Understanding      Infection Prevention: - Provides verbal and written material to individual with discussion of infection control including proper hand washing and proper equipment cleaning during exercise session.   Pulmonary Rehab from 03/30/2018 in [facility] Pulmonary Rehab  Date  03/30/18  Educator   [facility]  Instruction Review Code  1- Verbalizes Understanding      Falls Prevention: - Provides verbal and written material to individual with discussion of falls prevention and safety.   Pulmonary Rehab from 03/30/2018 in [facility] Pulmonary Rehab  Date  03/30/18  Educator  [facility]  Instruction Review Code  1- Verbalizes Understanding      Diabetes: - Individual verbal and written instruction to review signs/symptoms of diabetes, desired ranges of glucose level fasting, after meals and with exercise. Advice that pre and post exercise glucose checks will be done for 3 sessions at entry of program.   Chronic Lung Diseases: - Group verbal and written instruction to review updates, respiratory medications, advancements in procedures and treatments. Discuss use of supplemental oxygen including available portable oxygen systems, continuous and intermittent flow rates, concentrators, personal use and safety guidelines. Review proper use of inhaler and spacers. Provide informative websites for self-education.    Energy Conservation: - Provide group verbal and written instruction for methods to conserve energy, plan and organize activities. Instruct on pacing techniques, use of adaptive equipment and posture/positioning to relieve shortness of breath.   Triggers and Exacerbations: -  Group verbal and written instruction to review types of environmental triggers and ways to prevent exacerbations. Discuss weather changes, air quality and the benefits of nasal washing. Review warning signs and symptoms to help prevent infections. Discuss techniques for effective airway clearance, coughing, and vibrations.   AED/CPR: - Group verbal and written instruction with the use of models to demonstrate the basic use of the AED with the basic ABC's of resuscitation.   Anatomy and Physiology of the Lungs: - Group verbal and written instruction with the use of models to provide basic lung anatomy and physiology  related to function, structure and complications of lung disease.   Anatomy & Physiology of the Heart: - Group verbal and written instruction and models provide basic cardiac anatomy and physiology, with the coronary electrical and arterial systems. Review of Valvular disease and Heart Failure   Cardiac Medications: - Group verbal and written instruction to review commonly prescribed medications for heart disease. Reviews the medication, class of the drug, and side effects.   Know Your Numbers and Risk Factors: -Group verbal and written instruction about important numbers in your health.  Discussion of what are risk factors and how they play a role in the disease process.  Review of Cholesterol, Blood Pressure, Diabetes, and BMI and the role they play in your overall health.   Sleep Hygiene: -Provides group verbal and written instruction about how sleep can affect your health.  Define sleep hygiene, discuss sleep cycles and impact of sleep habits. Review good sleep hygiene tips.    Other: -Provides group and verbal instruction on various topics (see comments)    Knowledge Questionnaire Score: Knowledge Questionnaire Score - 03/30/18 1101      Knowledge Questionnaire Score   Pre Score  16/18   reviewed with patient       Core Components/Risk Factors/Patient Goals at Admission: Personal Goals and Risk Factors at Admission - 03/30/18 1126      Core Components/Risk Factors/Patient Goals on Admission    Weight Management  Yes;Weight Loss    Intervention  Weight Management: Develop a combined nutrition and exercise program designed to reach desired caloric intake, while maintaining appropriate intake of nutrient and fiber, sodium and fats, and appropriate energy expenditure required for the weight goal.;Weight Management: Provide education and appropriate resources to help participant work on and attain dietary goals.    Admit Weight  205 lb (93 kg)    Goal Weight: Short Term  200  lb (90.7 kg)    Goal Weight: Long Term  190 lb (86.2 kg)    Expected Outcomes  Long Term: Adherence to nutrition and physical activity/exercise program aimed toward attainment of established weight goal;Short Term: Continue to assess and modify interventions until short term weight is achieved;Weight Loss: Understanding of general recommendations for a balanced deficit meal plan, which promotes 1-2 lb weight loss per week and includes a negative energy balance of [phone] kcal/d;Understanding recommendations for meals to include 15-35% energy as protein, 25-35% energy from fat, 35-60% energy from carbohydrates, less than 227m of dietary cholesterol, 20-35 gm of total fiber daily;Understanding of distribution of calorie intake throughout the day with the consumption of 4-5 meals/snacks;Weight Maintenance: Understanding of the daily nutrition guidelines, which includes 25-35% calories from fat, 7% or less cal from saturated fats, less than 2078mcholesterol, less than 1.5gm of sodium, & 5 or more servings of fruits and vegetables daily    Improve shortness of breath with ADL's  Yes    Intervention  Provide education,  individualized exercise plan and daily activity instruction to help decrease symptoms of SOB with activities of daily living.    Expected Outcomes  Short Term: Improve cardiorespiratory fitness to achieve a reduction of symptoms when performing ADLs;Long Term: Be able to perform more ADLs without symptoms or delay the onset of symptoms       Core Components/Risk Factors/Patient Goals Review:    Core Components/Risk Factors/Patient Goals at Discharge (Final Review):    ITP Comments: ITP Comments    Row Name 03/30/18 1057           ITP Comments  Medical Evaluation completed. Chart sent for review and changes to Dr. Emily Filbert Director of [place]. Diagnosis can be found in [facility] encounte 03/23/2018          Comments: Initial ITP

## 2018-04-02 ENCOUNTER — Other Ambulatory Visit: Payer: Self-pay

## 2018-04-02 DIAGNOSIS — J449 Chronic obstructive pulmonary disease, unspecified: Secondary | ICD-10-CM | POA: Diagnosis not present

## 2018-04-02 NOTE — Progress Notes (Signed)
Daily Session Note  Patient Details  Name: Gordon Cameron MRN: [id number] Date of Birth: 06-05-1934 Referring Provider:     Pulmonary Rehab from 03/30/2018 in [facility]  Referring Provider  Ashby Dawes      Encounter Date: 04/02/2018  Check In: Session Check In - 04/02/18 1144      Check-In   Supervising physician immediately available to respond to emergencies  [facility] immediately available ER MD    Physician(s)  Dr. Burlene Arnt and Corky Downs    Location  [facility] & [facility]    Staff Present  Justin Mend Lorre Nick, [place], RCEP, CCRP, Exercise Physiologist;Krista Frederico Hamman, RN BSN    Medication changes reported      No    Fall or balance concerns reported     No    Warm-up and Cool-down  Performed as group-led Higher education careers adviser Performed  Yes    VAD Patient?  No    PAD/SET Patient?  No      Pain Assessment   Currently in Pain?  No/denies          Social History   Tobacco Use  Smoking Status Never Smoker  Smokeless Tobacco Never Used    Goals Met:  Exercise tolerated well Personal goals reviewed Queuing for purse lip breathing No report of cardiac concerns or symptoms Strength training completed today  Goals Unmet:  Not Applicable  Comments: First full day of exercise!  Patient was oriented to gym and equipment including functions, settings, policies, and procedures.  Patient's individual exercise prescription and treatment plan were reviewed.  All starting workloads were established based on the results of the 6 minute walk test done at initial orientation visit.  The plan for exercise progression was also introduced and progression will be customized based on patient's performance and goals.    Dr. Emily Filbert is Medical Director for [facility] and [facility].

## 2018-04-12 DIAGNOSIS — J449 Chronic obstructive pulmonary disease, unspecified: Secondary | ICD-10-CM

## 2018-04-26 ENCOUNTER — Encounter: Payer: Self-pay | Admitting: *Deleted

## 2018-04-26 DIAGNOSIS — J449 Chronic obstructive pulmonary disease, unspecified: Secondary | ICD-10-CM

## 2018-04-26 NOTE — Progress Notes (Signed)
Pulmonary Individual Treatment Plan  Patient Details  Name: Gordon Cameron MRN: [id number] Date of Birth: [birth year] Referring Provider:     Pulmonary Rehab from 03/30/2018 in [facility]  Referring Provider  Ramachandran      Initial Encounter Date:    Pulmonary Rehab from 03/30/2018 in [facility]  Date  03/30/18      Visit Diagnosis: Chronic obstructive pulmonary disease, unspecified COPD type ([place])  Patient's Home Medications on Admission:  Current Outpatient Medications:  .  albuterol (VENTOLIN HFA) 108 (90 Base) MCG/ACT inhaler, Inhale 2 puffs into the lungs every 6 (six) hours as needed for wheezing or shortness of breath., Disp: 1 Inhaler, Rfl: 5 .  cetirizine (ZYRTEC) 10 MG tablet, Take 10 mg by mouth daily as needed. , Disp: , Rfl:  .  finasteride (PROSCAR) 5 MG tablet, Once a day, Disp: , Rfl:  .  fluticasone (FLONASE) 50 MCG/ACT nasal spray, Place 2 sprays into both nostrils daily., Disp: , Rfl:  .  furosemide (LASIX) 20 MG tablet, Take 40 mg by mouth daily. , Disp: , Rfl: 2 .  tiotropium (SPIRIVA HANDIHALER) 18 MCG inhalation capsule, Place 1 capsule (18 mcg total) into inhaler and inhale daily., Disp: 30 capsule, Rfl: 2  Past Medical History: Past Medical History:  Diagnosis Date  . BPH (benign prostatic hyperplasia)   . Hyperlipidemia   . OSA (obstructive sleep apnea)   . Reactive airway disease   . Restless leg     Tobacco Use: Social History   Tobacco Use  Smoking Status Never Smoker  Smokeless Tobacco Never Used    Labs: Recent Review Flowsheet Data    There is no flowsheet data to display.       Pulmonary Assessment Scores: Pulmonary Assessment Scores    Row Name 03/30/18 1059         ADL UCSD   ADL Phase  Entry     SOB Score total  27     Rest  0     Walk  1     Stairs  3     Bath  1     Dress  2     Shop  1       CAT Score   CAT Score  8       mMRC Score   mMRC Score  2         Pulmonary Function Assessment: Pulmonary Function Assessment - 03/30/18 1100      Initial Spirometry Results   FVC%  61 %    FEV1%  57 %    FEV1/FVC Ratio  66    Comments  Test done on 07/03/2017      Post Bronchodilator Spirometry Results   FVC%  61 %    FEV1%  64 %    FEV1/FVC Ratio  74    Comments  Test done on 07/03/2017      Breath   Bilateral Breath Sounds  Clear    Shortness of Breath  Yes;Limiting activity       Exercise Target Goals: Exercise Program Goal: Individual exercise prescription set using results from initial 6 min walk test and THRR while considering  patient's activity barriers and safety.   Exercise Prescription Goal: Initial exercise prescription builds to 30-45 minutes a day of aerobic activity, 2-3 days per week.  Home exercise guidelines will be given to patient during program as part of exercise prescription that the participant will  acknowledge.  Activity Barriers & Risk Stratification:   6 Minute Walk: 6 Minute Walk    Row Name 03/30/18 1125         6 Minute Walk   Phase  Initial     Distance  1270 feet     Walk Time  6 minutes     # of Rest Breaks  0     MPH  2.4     METS  2.3     RPE  13     Perceived Dyspnea   2     VO2 Peak  8.03     Symptoms  No     Resting HR  87 bpm     Resting BP  108/64     Resting Oxygen Saturation   94 %     Exercise Oxygen Saturation  during 6 min walk  94 %     Max Ex. HR  120 bpm     Max Ex. BP  138/76     2 Minute Post BP  124/68       Interval HR   1 Minute HR  104     2 Minute HR  109     4 Minute HR  116     5 Minute HR  118     6 Minute HR  120     2 Minute Post HR  99     Interval Heart Rate?  Yes       Interval Oxygen   Interval Oxygen?  Yes     Baseline Oxygen Saturation %  94 %     1 Minute Oxygen Saturation %  94 %     1 Minute Liters of Oxygen  0 L     2 Minute Oxygen Saturation %  95 %     2 Minute Liters of Oxygen  0 L     3 Minute Liters of Oxygen  0 L     4 Minute  Oxygen Saturation %  95 %     4 Minute Liters of Oxygen  0 L     5 Minute Oxygen Saturation %  94 %     5 Minute Liters of Oxygen  0 L     6 Minute Oxygen Saturation %  95 %     6 Minute Liters of Oxygen  0 L     2 Minute Post Oxygen Saturation %  96 %     2 Minute Post Liters of Oxygen  0 L       Oxygen Initial Assessment: Oxygen Initial Assessment - 03/30/18 1119      Home Oxygen   Home Oxygen Device  None    Sleep Oxygen Prescription  CPAP    Liters per minute  0    Home Exercise Oxygen Prescription  None    Home at Rest Exercise Oxygen Prescription  None    Compliance with Home Oxygen Use  Yes      Initial 6 min Walk   Oxygen Used  None      Program Oxygen Prescription   Program Oxygen Prescription  None      Intervention   Short Term Goals  To learn and demonstrate proper use of respiratory medications;To learn and demonstrate proper pursed lip breathing techniques or other breathing techniques.;To learn and understand importance of maintaining oxygen saturations>88%;To learn and understand importance of monitoring SPO2 with pulse oximeter and demonstrate accurate use of the pulse oximeter.  Long  Term Goals  Verbalizes importance of monitoring SPO2 with pulse oximeter and return demonstration;Maintenance of O2 saturations>88%;Exhibits proper breathing techniques, such as pursed lip breathing or other method taught during program session;Compliance with respiratory medication;Demonstrates proper use of MDI's       Oxygen Re-Evaluation: Oxygen Re-Evaluation    Row Name 04/02/18 1145 04/14/18 1450           Program Oxygen Prescription   Program Oxygen Prescription  None  None        Home Oxygen   Home Oxygen Device  None  None      Sleep Oxygen Prescription  CPAP  CPAP      Liters per minute  0  0      Home Exercise Oxygen Prescription  None  None      Home at Rest Exercise Oxygen Prescription  None  None      Compliance with Home Oxygen Use  Yes  Yes         Goals/Expected Outcomes   Short Term Goals  To learn and demonstrate proper use of respiratory medications;To learn and demonstrate proper pursed lip breathing techniques or other breathing techniques.;To learn and understand importance of maintaining oxygen saturations>88%;To learn and understand importance of monitoring SPO2 with pulse oximeter and demonstrate accurate use of the pulse oximeter.  To learn and demonstrate proper use of respiratory medications;To learn and demonstrate proper pursed lip breathing techniques or other breathing techniques.;To learn and understand importance of maintaining oxygen saturations>88%;To learn and understand importance of monitoring SPO2 with pulse oximeter and demonstrate accurate use of the pulse oximeter.      Long  Term Goals  Verbalizes importance of monitoring SPO2 with pulse oximeter and return demonstration;Maintenance of O2 saturations>88%;Exhibits proper breathing techniques, such as pursed lip breathing or other method taught during program session;Compliance with respiratory medication;Demonstrates proper use of MDI's  Verbalizes importance of monitoring SPO2 with pulse oximeter and return demonstration;Maintenance of O2 saturations>88%;Exhibits proper breathing techniques, such as pursed lip breathing or other method taught during program session;Compliance with respiratory medication;Demonstrates proper use of MDI's      Comments  Reviewed PLB technique with pt.  Talked about how it work and it's important to maintaining his exercise saturations.    Gordon Cameron has been using his PLB and finding it helpful.  Still not normal to him yet, but he continues to work on it.  He is staying compliant with his CPAP.       Goals/Expected Outcomes  Short: Become more profiecient at using PLB.   Long: Become independent at using PLB.  Short: Continue to work on PLB.  Long: Let the PLB become more natural.          Oxygen Discharge (Final Oxygen Re-Evaluation): Oxygen  Re-Evaluation - 04/14/18 1450      Program Oxygen Prescription   Program Oxygen Prescription  None      Home Oxygen   Home Oxygen Device  None    Sleep Oxygen Prescription  CPAP    Liters per minute  0    Home Exercise Oxygen Prescription  None    Home at Rest Exercise Oxygen Prescription  None    Compliance with Home Oxygen Use  Yes      Goals/Expected Outcomes   Short Term Goals  To learn and demonstrate proper use of respiratory medications;To learn and demonstrate proper pursed lip breathing techniques or other breathing techniques.;To learn and understand importance of maintaining oxygen saturations>88%;To learn and understand  importance of monitoring SPO2 with pulse oximeter and demonstrate accurate use of the pulse oximeter.    Long  Term Goals  Verbalizes importance of monitoring SPO2 with pulse oximeter and return demonstration;Maintenance of O2 saturations>88%;Exhibits proper breathing techniques, such as pursed lip breathing or other method taught during program session;Compliance with respiratory medication;Demonstrates proper use of MDI's    Comments  Gordon Cameron has been using his PLB and finding it helpful.  Still not normal to him yet, but he continues to work on it.  He is staying compliant with his CPAP.     Goals/Expected Outcomes  Short: Continue to work on PLB.  Long: Let the PLB become more natural.        Initial Exercise Prescription: Initial Exercise Prescription - 03/30/18 1100      Date of Initial Exercise RX and Referring Provider   Date  03/30/18    Referring Provider  Ramachandran      Treadmill   MPH  1.7    Grade  0    Minutes  15    METs  2.3      Recumbant Bike   Level  2    RPM  60    Watts  10    Minutes  15    METs  2.3      NuStep   Level  2    SPM  80    Minutes  15    METs  2.3      Prescription Details   Frequency (times per week)  3    Duration  Progress to 45 minutes of aerobic exercise without signs/symptoms of physical distress       Intensity   THRR 40-80% of Max Heartrate  108-129    Ratings of Perceived Exertion  11-13    Perceived Dyspnea  0-4      Resistance Training   Training Prescription  Yes    Weight  3 lb    Reps  10-15       Perform Capillary Blood Glucose checks as needed.  Exercise Prescription Changes: Exercise Prescription Changes    Row Name 04/14/18 1300             Response to Exercise   Blood Pressure (Admit)  120/60       Blood Pressure (Exercise)  144/78       Blood Pressure (Exit)  114/72       Heart Rate (Admit)  81 bpm       Heart Rate (Exercise)  97 bpm       Heart Rate (Exit)  95 bpm       Oxygen Saturation (Admit)  95 %       Oxygen Saturation (Exercise)  91 %       Oxygen Saturation (Exit)  93 %       Rating of Perceived Exertion (Exercise)  13       Perceived Dyspnea (Exercise)  2       Symptoms  none       Comments  first full day of exercise       Duration  Progress to 45 minutes of aerobic exercise without signs/symptoms of physical distress       Intensity  THRR unchanged         Progression   Progression  Continue to progress workloads to maintain intensity without signs/symptoms of physical distress.       Average METs  2.32  Resistance Training   Training Prescription  Yes       Weight  3 lbs       Reps  10-15         Interval Training   Interval Training  No         Treadmill   MPH  1.7       Grade  0       Minutes  15       METs  2.3         Recumbant Bike   Level  2       Watts  22       Minutes  15       METs  2.66         NuStep   Level  2       Minutes  15       METs  2         Home Exercise Plan   Plans to continue exercise at  Home (comment) walking and videos       Frequency  Add 3 additional days to program exercise sessions.       Initial Home Exercises Provided  04/14/18 mailed packet          Exercise Comments: Exercise Comments    Row Name 04/02/18 1145           Exercise Comments  First full day of  exercise!  Patient was oriented to gym and equipment including functions, settings, policies, and procedures.  Patient's individual exercise prescription and treatment plan were reviewed.  All starting workloads were established based on the results of the 6 minute walk test done at initial orientation visit.  The plan for exercise progression was also introduced and progression will be customized based on patient's performance and goals.          Exercise Goals and Review: Exercise Goals    Row Name 03/30/18 1125             Exercise Goals   Increase Physical Activity  Yes       Intervention  Provide advice, education, support and counseling about physical activity/exercise needs.;Develop an individualized exercise prescription for aerobic and resistive training based on initial evaluation findings, risk stratification, comorbidities and participant's personal goals.       Expected Outcomes  Short Term: Attend rehab on a regular basis to increase amount of physical activity.;Long Term: Add in home exercise to make exercise part of routine and to increase amount of physical activity.;Long Term: Exercising regularly at least 3-5 days a week.       Increase Strength and Stamina  Yes       Intervention  Provide advice, education, support and counseling about physical activity/exercise needs.;Develop an individualized exercise prescription for aerobic and resistive training based on initial evaluation findings, risk stratification, comorbidities and participant's personal goals.       Expected Outcomes  Short Term: Increase workloads from initial exercise prescription for resistance, speed, and METs.;Short Term: Perform resistance training exercises routinely during rehab and add in resistance training at home;Long Term: Improve cardiorespiratory fitness, muscular endurance and strength as measured by increased METs and functional capacity (6MWT)       Able to understand and use rate of perceived  exertion (RPE) scale  Yes       Intervention  Provide education and explanation on how to use RPE scale       Expected Outcomes  Short Term: Able to use RPE daily in rehab to express subjective intensity level;Long Term:  Able to use RPE to guide intensity level when exercising independently       Able to understand and use Dyspnea scale  Yes       Intervention  Provide education and explanation on how to use Dyspnea scale       Expected Outcomes  Short Term: Able to use Dyspnea scale daily in rehab to express subjective sense of shortness of breath during exertion;Long Term: Able to use Dyspnea scale to guide intensity level when exercising independently       Knowledge and understanding of Target Heart Rate Range (THRR)  Yes       Intervention  Provide education and explanation of THRR including how the numbers were predicted and where they are located for reference       Expected Outcomes  Short Term: Able to state/look up THRR;Short Term: Able to use daily as guideline for intensity in rehab;Long Term: Able to use THRR to govern intensity when exercising independently       Able to check pulse independently  Yes       Intervention  Provide education and demonstration on how to check pulse in carotid and radial arteries.;Review the importance of being able to check your own pulse for safety during independent exercise       Expected Outcomes  Short Term: Able to explain why pulse checking is important during independent exercise;Long Term: Able to check pulse independently and accurately       Understanding of Exercise Prescription  Yes       Intervention  Provide education, explanation, and written materials on patient's individual exercise prescription       Expected Outcomes  Short Term: Able to explain program exercise prescription;Long Term: Able to explain home exercise prescription to exercise independently          Exercise Goals Re-Evaluation : Exercise Goals Re-Evaluation    Row Name  04/02/18 1145 04/14/18 1259           Exercise Goal Re-Evaluation   Exercise Goals Review  Able to understand and use rate of perceived exertion (RPE) scale;Able to understand and use Dyspnea scale;Knowledge and understanding of Target Heart Rate Range (THRR);Understanding of Exercise Prescription  Increase Physical Activity;Increase Strength and Stamina;Understanding of Exercise Prescription      Comments  Reviewed RPE scale, THR and program prescription with pt today.  Pt voiced understanding and was given a copy of goals to take home.   Gordon Cameron is off to a good start for rehab.  He has completed one full day of exercise.  We have sent him a copy of the home exercise guidelines in the mail.  He has been encouraged to walk more at home.  He has been staying active just not necessairly walking.  We will continue to follow his progress at home.       Expected Outcomes  Short: Use RPE daily to regulate intensity. Long: Follow program prescription in THR.  Short: Start to walk at home.  Long: Continue to follow along with videos.          Discharge Exercise Prescription (Final Exercise Prescription Changes): Exercise Prescription Changes - 04/14/18 1300      Response to Exercise   Blood Pressure (Admit)  120/60    Blood Pressure (Exercise)  144/78    Blood Pressure (Exit)  114/72    Heart Rate (Admit)  81 bpm  Heart Rate (Exercise)  97 bpm    Heart Rate (Exit)  95 bpm    Oxygen Saturation (Admit)  95 %    Oxygen Saturation (Exercise)  91 %    Oxygen Saturation (Exit)  93 %    Rating of Perceived Exertion (Exercise)  13    Perceived Dyspnea (Exercise)  2    Symptoms  none    Comments  first full day of exercise    Duration  Progress to 45 minutes of aerobic exercise without signs/symptoms of physical distress    Intensity  THRR unchanged      Progression   Progression  Continue to progress workloads to maintain intensity without signs/symptoms of physical distress.    Average METs   2.32      Resistance Training   Training Prescription  Yes    Weight  3 lbs    Reps  10-15      Interval Training   Interval Training  No      Treadmill   MPH  1.7    Grade  0    Minutes  15    METs  2.3      Recumbant Bike   Level  2    Watts  22    Minutes  15    METs  2.66      NuStep   Level  2    Minutes  15    METs  2      Home Exercise Plan   Plans to continue exercise at  Home (comment)   walking and videos   Frequency  Add 3 additional days to program exercise sessions.    Initial Home Exercises Provided  04/14/18   mailed packet      Nutrition:  Target Goals: Understanding of nutrition guidelines, daily intake of sodium <1558m, cholesterol <204m calories 30% from fat and 7% or less from saturated fats, daily to have 5 or more servings of fruits and vegetables.  Biometrics: Pre Biometrics - 03/30/18 1124      Pre Biometrics   Height  5' 8" (1.727 m)    Weight  205 lb 9.6 oz (93.3 kg)    Waist Circumference  40 inches    Hip Circumference  42 inches    Waist to Hip Ratio  0.95 %    BMI (Calculated)  31.27        Nutrition Therapy Plan and Nutrition Goals: Nutrition Therapy & Goals - 03/30/18 1124      Personal Nutrition Goals   Nutrition Goal  He wants to lose weight.    Personal Goal #2  Eat healthier with his new teeth. find foods that he is able to eat.    Personal Goal #3  Increase appetite    Comments  He has new teeth and it is hard for him to eat sometimes and his diet has changed alot.      Intervention Plan   Intervention  Prescribe, educate and counsel regarding individualized specific dietary modifications aiming towards targeted core components such as weight, hypertension, lipid management, diabetes, heart failure and other comorbidities.;Nutrition handout(s) given to patient.    Expected Outcomes  Short Term Goal: Understand basic principles of dietary content, such as calories, fat, sodium, cholesterol and nutrients.;Long Term  Goal: Adherence to prescribed nutrition plan.       Nutrition Assessments:   Nutrition Goals Re-Evaluation:   Nutrition Goals Discharge (Final Nutrition Goals Re-Evaluation):   Psychosocial: Target Goals: Acknowledge presence  or absence of significant depression and/or stress, maximize coping skills, provide positive support system. Participant is able to verbalize types and ability to use techniques and skills needed for reducing stress and depression.   Initial Review & Psychosocial Screening: Initial Psych Review & Screening - 03/30/18 1122      Initial Review   Current issues with  Current Stress Concerns    Source of Stress Concerns  Chronic Illness    Comments  He is not able to sleep well at night and is having trouble doing the things he wants to do outside.      Family Dynamics   Good Support System?  Yes    Comments  He has his wife, daughter and son.      Barriers   Psychosocial barriers to participate in program  The patient should benefit from training in stress management and relaxation.      Screening Interventions   Interventions  Provide feedback about the scores to participant;To provide support and resources with identified psychosocial needs;Program counselor consult;Encouraged to exercise    Expected Outcomes  Long Term Goal: Stressors or current issues are controlled or eliminated.;Short Term goal: Utilizing psychosocial counselor, staff and physician to assist with identification of specific Stressors or current issues interfering with healing process. Setting desired goal for each stressor or current issue identified.;Short Term goal: Identification and review with participant of any Quality of Life or Depression concerns found by scoring the questionnaire.;Long Term goal: The participant improves quality of Life and PHQ9 Scores as seen by post scores and/or verbalization of changes       Quality of Life Scores:  Scores of 19 and below usually indicate a  poorer quality of life in these areas.  A difference of  2-3 points is a clinically meaningful difference.  A difference of 2-3 points in the total score of the Quality of Life Index has been associated with significant improvement in overall quality of life, self-image, physical symptoms, and general health in studies assessing change in quality of life.  PHQ-9: Recent Review Flowsheet Data    Depression screen [facility] 2/9 03/30/2018   Decreased Interest 0   Down, Depressed, Hopeless 0   PHQ - 2 Score 0   Altered sleeping 1   Tired, decreased energy 1   Change in appetite 1   Feeling bad or failure about yourself  0   Trouble concentrating 0   Moving slowly or fidgety/restless 0   Suicidal thoughts 0   PHQ-9 Score 3   Difficult doing work/chores Not difficult at all     Interpretation of Total Score  Total Score Depression Severity:  1-4 = Minimal depression, 5-9 = Mild depression, 10-14 = Moderate depression, 15-19 = Moderately severe depression, 20-27 = Severe depression   Psychosocial Evaluation and Intervention:   Psychosocial Re-Evaluation: Psychosocial Re-Evaluation    [place] Name 04/14/18 1452             Psychosocial Re-Evaluation   Current issues with  Current Stress Concerns       Comments  Gordon Cameron is doing his best to say positive.  He is stressed with the current health scare, but handling it the best that he can.  He is doing the best he can and staying active/       Expected Outcomes  Short: Continue to be postive.  Long: Continue to stay active to help cope with stress.        Interventions  Encouraged to attend Pulmonary  Rehabilitation for the exercise       Continue Psychosocial Services   Follow up required by staff          Psychosocial Discharge (Final Psychosocial Re-Evaluation): Psychosocial Re-Evaluation - 04/14/18 1452      Psychosocial Re-Evaluation   Current issues with  Current Stress Concerns    Comments  Gordon Cameron is doing his best to say positive.   He is stressed with the current health scare, but handling it the best that he can.  He is doing the best he can and staying active/    Expected Outcomes  Short: Continue to be postive.  Long: Continue to stay active to help cope with stress.     Interventions  Encouraged to attend Pulmonary Rehabilitation for the exercise    Continue Psychosocial Services   Follow up required by staff       Education: Education Goals: Education classes will be provided on a weekly basis, covering required topics. Participant will state understanding/return demonstration of topics presented.  Learning Barriers/Preferences: Learning Barriers/Preferences - 03/30/18 1126      Learning Barriers/Preferences   Learning Barriers  Sight   wears glasses   Learning Preferences  None       Education Topics:  Initial Evaluation Education: - Verbal, written and demonstration of respiratory meds, oximetry and breathing techniques. Instruction on use of nebulizers and MDIs and importance of monitoring MDI activations.   Pulmonary Rehab from 03/30/2018 in [facility] Pul[facility] Rehab  Date  03/30/18  Educator  [facility]  Instruction Review Code  1- Verbalizes Understanding      General Nutrition Guidelines/Fats and Fiber: -Group instruction provided by verbal, written material, models and posters to present the general guidelines for heart healthy nutrition. Gives an explanation and review of dietary fats and fiber.   Controlling Sodium/Reading Food Labels: -Group verbal and written material supporting the discussion of sodium use in heart healthy nutrition. Review and explanation with models, verbal and written materials for utilization of the food label.   Exercise Physiology & General Exercise Guidelines: - Group verbal and written instruction with models to review the exercise physiology of the cardiovascular system and associated critical values. Provides general exercise guidelines with specific guidelines  to those with heart or lung disease.    Aerobic Exercise & Resistance Training: - Gives group verbal and written instruction on the various components of exercise. Focuses on aerobic and resistive training programs and the benefits of this training and how to safely progress through these programs.   Flexibility, Balance, Mind/Body Relaxation: Provides group verbal/written instruction on the benefits of flexibility and balance training, including mind/body exercise modes such as yoga, pilates and tai chi.  Demonstration and skill practice provided.   Stress and Anxiety: - Provides group verbal and written instruction about the health risks of elevated stress and causes of high stress.  Discuss the correlation between heart/lung disease and anxiety and treatment options. Review healthy ways to manage with stress and anxiety.   Depression: - Provides group verbal and written instruction on the correlation between heart/lung disease and depressed mood, treatment options, and the stigmas associated with seeking treatment.   Exercise & Equipment Safety: - Individual verbal instruction and demonstration of equipment use and safety with use of the equipment.   Pulmonary Rehab from 03/30/2018 in [facility] Cardiac and Pulmonary Rehab  Date  03/30/18  Educator  [facility]  Instruction Review Code  1- Verbalizes Understanding      Infection Prevention: - Provides verbal  and written material to individual with discussion of infection control including proper hand washing and proper equipment cleaning during exercise session.   Pulmonary Rehab from 03/30/2018 in [facility] Cardiac and Pulmonary Rehab  Date  03/30/18  Educator  [facility]  Instruction Review Code  1- Verbalizes Understanding      Falls Prevention: - Provides verbal and written material to individual with discussion of falls prevention and safety.   Pulmonary Rehab from 03/30/2018 in [facility] Cardiac and Pulmonary Rehab  Date  03/30/18  Educator  [facility]   Instruction Review Code  1- Verbalizes Understanding      Diabetes: - Individual verbal and written instruction to review signs/symptoms of diabetes, desired ranges of glucose level fasting, after meals and with exercise. Advice that pre and post exercise glucose checks will be done for 3 sessions at entry of program.   Chronic Lung Diseases: - Group verbal and written instruction to review updates, respiratory medications, advancements in procedures and treatments. Discuss use of supplemental oxygen including available portable oxygen systems, continuous and intermittent flow rates, concentrators, personal use and safety guidelines. Review proper use of inhaler and spacers. Provide informative websites for self-education.    Energy Conservation: - Provide group verbal and written instruction for methods to conserve energy, plan and organize activities. Instruct on pacing techniques, use of adaptive equipment and posture/positioning to relieve shortness of breath.   Triggers and Exacerbations: - Group verbal and written instruction to review types of environmental triggers and ways to prevent exacerbations. Discuss weather changes, air quality and the benefits of nasal washing. Review warning signs and symptoms to help prevent infections. Discuss techniques for effective airway clearance, coughing, and vibrations.   AED/CPR: - Group verbal and written instruction with the use of models to demonstrate the basic use of the AED with the basic ABC's of resuscitation.   Anatomy and Physiology of the Lungs: - Group verbal and written instruction with the use of models to provide basic lung anatomy and physiology related to function, structure and complications of lung disease.   Anatomy & Physiology of the Heart: - Group verbal and written instruction and models provide basic cardiac anatomy and physiology, with the coronary electrical and arterial systems. Review of Valvular disease and  Heart Failure   Cardiac Medications: - Group verbal and written instruction to review commonly prescribed medications for heart disease. Reviews the medication, class of the drug, and side effects.   Know Your Numbers and Risk Factors: -Group verbal and written instruction about important numbers in your health.  Discussion of what are risk factors and how they play a role in the disease process.  Review of Cholesterol, Blood Pressure, Diabetes, and BMI and the role they play in your overall health.   Sleep Hygiene: -Provides group verbal and written instruction about how sleep can affect your health.  Define sleep hygiene, discuss sleep cycles and impact of sleep habits. Review good sleep hygiene tips.    Other: -Provides group and verbal instruction on various topics (see comments)    Knowledge Questionnaire Score: Knowledge Questionnaire Score - 03/30/18 1101      Knowledge Questionnaire Score   Pre Score  16/18   reviewed with patient       Core Components/Risk Factors/Patient Goals at Admission: Personal Goals and Risk Factors at Admission - 03/30/18 1126      Core Components/Risk Factors/Patient Goals on Admission    Weight Management  Yes;Weight Loss    Intervention  Weight Management: Develop a combined nutrition  and exercise program designed to reach desired caloric intake, while maintaining appropriate intake of nutrient and fiber, sodium and fats, and appropriate energy expenditure required for the weight goal.;Weight Management: Provide education and appropriate resources to help participant work on and attain dietary goals.    Admit Weight  205 lb (93 kg)    Goal Weight: Short Term  200 lb (90.7 kg)    Goal Weight: Long Term  190 lb (86.2 kg)    Expected Outcomes  Long Term: Adherence to nutrition and physical activity/exercise program aimed toward attainment of established weight goal;Short Term: Continue to assess and modify interventions until short term weight  is achieved;Weight Loss: Understanding of general recommendations for a balanced deficit meal plan, which promotes 1-2 lb weight loss per week and includes a negative energy balance of [phone] kcal/d;Understanding recommendations for meals to include 15-35% energy as protein, 25-35% energy from fat, 35-60% energy from carbohydrates, less than 266m of dietary cholesterol, 20-35 gm of total fiber daily;Understanding of distribution of calorie intake throughout the day with the consumption of 4-5 meals/snacks;Weight Maintenance: Understanding of the daily nutrition guidelines, which includes 25-35% calories from fat, 7% or less cal from saturated fats, less than 2030mcholesterol, less than 1.5gm of sodium, & 5 or more servings of fruits and vegetables daily    Improve shortness of breath with ADL's  Yes    Intervention  Provide education, individualized exercise plan and daily activity instruction to help decrease symptoms of SOB with activities of daily living.    Expected Outcomes  Short Term: Improve cardiorespiratory fitness to achieve a reduction of symptoms when performing ADLs;Long Term: Be able to perform more ADLs without symptoms or delay the onset of symptoms       Core Components/Risk Factors/Patient Goals Review:  Goals and Risk Factor Review    Row Name 04/14/18 1453             Core Components/Risk Factors/Patient Goals Review   Personal Goals Review  Weight Management/Obesity;Hypertension;Improve shortness of breath with ADL's       Review  Gordon Cameron been doing well. His weight is down some with his Lasix.  His blood pressures have been good as well.  His SOB is improving and overall he is feeling good.        Expected Outcomes  Short: Continue to work on weight loss.  Long: Continue to improve SOB          Core Components/Risk Factors/Patient Goals at Discharge (Final Review):  Goals and Risk Factor Review - 04/14/18 1453      Core Components/Risk Factors/Patient Goals  Review   Personal Goals Review  Weight Management/Obesity;Hypertension;Improve shortness of breath with ADL's    Review  Gordon Cameron been doing well. His weight is down some with his Lasix.  His blood pressures have been good as well.  His SOB is improving and overall he is feeling good.     Expected Outcomes  Short: Continue to work on weight loss.  Long: Continue to improve SOB       ITP Comments: ITP Comments    Row Name 03/30/18 1057 04/12/18 0931 04/26/18 1024       ITP Comments  Medical Evaluation completed. Chart sent for review and changes to Dr. MaEmily Filbertirector of [facility] be found [facility]  Our program is currently closed due to COVID-19.  We are communicating with patient via phone calls and emails.  30 day review completed.  ITP sent to Dr. Emily Filbert for review,changes as needed and signature. Continue with ITP unless changes directed by Dr. Sabra Heck.         Comments:

## 2018-04-30 ENCOUNTER — Encounter: Payer: Self-pay | Admitting: *Deleted

## 2018-04-30 DIAGNOSIS — J449 Chronic obstructive pulmonary disease, unspecified: Secondary | ICD-10-CM

## 2018-05-07 DIAGNOSIS — J449 Chronic obstructive pulmonary disease, unspecified: Secondary | ICD-10-CM

## 2018-05-20 ENCOUNTER — Telehealth: Payer: Self-pay | Admitting: *Deleted

## 2018-05-20 NOTE — Telephone Encounter (Signed)
Called to check up on patient.  Left message and sent email.

## 2018-06-16 ENCOUNTER — Encounter: Payer: Self-pay | Admitting: *Deleted

## 2018-06-16 DIAGNOSIS — J449 Chronic obstructive pulmonary disease, unspecified: Secondary | ICD-10-CM

## 2018-07-13 ENCOUNTER — Encounter: Payer: Self-pay | Admitting: *Deleted

## 2018-07-13 DIAGNOSIS — J449 Chronic obstructive pulmonary disease, unspecified: Secondary | ICD-10-CM

## 2018-08-02 ENCOUNTER — Encounter: Payer: Self-pay | Admitting: *Deleted

## 2018-08-02 DIAGNOSIS — J449 Chronic obstructive pulmonary disease, unspecified: Secondary | ICD-10-CM

## 2018-08-19 ENCOUNTER — Encounter: Payer: Self-pay | Admitting: *Deleted

## 2018-08-19 DIAGNOSIS — J449 Chronic obstructive pulmonary disease, unspecified: Secondary | ICD-10-CM

## 2018-08-30 ENCOUNTER — Encounter: Payer: Medicare Other | Attending: Internal Medicine | Admitting: *Deleted

## 2018-08-30 ENCOUNTER — Other Ambulatory Visit: Payer: Self-pay

## 2018-08-30 DIAGNOSIS — J449 Chronic obstructive pulmonary disease, unspecified: Secondary | ICD-10-CM | POA: Insufficient documentation

## 2018-08-30 NOTE — Progress Notes (Signed)
Daily Session Note  Patient Details  Name: Gordon Cameron MRN: [id number] Date of Birth: December 06, 1934 Referring Provider:     Pulmonary Rehab from 03/30/2018 in [facility] Cardiac and Pulmonary Rehab  Referring Provider  Ashby Dawes      Encounter Date: 08/30/2018  Check In: Session Check In - 08/30/18 1107      Check-In   Supervising physician immediately available to respond to emergencies  See telemetry face sheet for immediately available ER MD    Location  [facility]-Cardiac & Pulmonary Rehab    Staff Present  Renita Papa, RN Moises Blood, BS, ACSM CEP, Exercise Physiologist;Joseph Tessie Fass RCP,RRT,BSRT    Virtual Visit  No    Medication changes reported      No    Fall or balance concerns reported     No    Warm-up and Cool-down  Performed on first and last piece of equipment    Resistance Training Performed  Yes    VAD Patient?  No    PAD/SET Patient?  No      Pain Assessment   Currently in Pain?  No/denies          Social History   Tobacco Use  Smoking Status Never Smoker  Smokeless Tobacco Never Used    Goals Met:  Independence with exercise equipment Exercise tolerated well No report of cardiac concerns or symptoms Strength training completed today  Goals Unmet:  Not Applicable  Comments: Pt able to follow exercise prescription today without complaint.  Will continue to monitor for progression.    Dr. Emily Filbert is Medical Director for [facility].

## 2018-09-01 ENCOUNTER — Other Ambulatory Visit: Payer: Self-pay

## 2018-09-01 ENCOUNTER — Encounter: Payer: Self-pay | Admitting: *Deleted

## 2018-09-01 DIAGNOSIS — J449 Chronic obstructive pulmonary disease, unspecified: Secondary | ICD-10-CM

## 2018-09-01 NOTE — Progress Notes (Signed)
Pulmonary Individual Treatment Plan  Patient Details  Name: Gordon Cameron MRN: [id number] Date of Birth: 1934/04/01 Referring Provider:     Pulmonary Rehab from 03/30/2018 in [facility] Cardiac [facility] Pulmonary Rehab  Referring Provider  Ramachandran      Initial Encounter Date:    Pulmonary Rehab from 03/30/2018 in [facility] Cardiac and Pulmonary Rehab  Date  03/30/18      Visit Diagnosis: Chronic obstructive pulmonary disease, unspecified COPD type ([place])  Patient's Home Medications on Admission:  Current Outpatient Medications:  .  albuterol (VENTOLIN HFA) 108 (90 Base) MCG/ACT inhaler, Inhale 2 puffs into the lungs every 6 (six) hours as needed for wheezing or shortness of breath., Disp: 1 Inhaler, Rfl: 5 .  cetirizine (ZYRTEC) 10 MG tablet, Take 10 mg by mouth daily as needed. , Disp: , Rfl:  .  finasteride (PROSCAR) 5 MG tablet, Once a day, Disp: , Rfl:  .  fluticasone (FLONASE) 50 MCG/ACT nasal spray, Place 2 sprays into both nostrils daily., Disp: , Rfl:  .  furosemide (LASIX) 20 MG tablet, Take 40 mg by mouth daily. , Disp: , Rfl: 2 .  tiotropium (SPIRIVA HANDIHALER) 18 MCG inhalation capsule, Place 1 capsule (18 mcg total) into inhaler and inhale daily., Disp: 30 capsule, Rfl: 2  Past Medical History: Past Medical History:  Diagnosis Date  . BPH (benign prostatic hyperplasia)   . Hyperlipidemia   . OSA (obstructive sleep apnea)   . Reactive airway disease   . Restless leg     Tobacco Use: Social History   Tobacco Use  Smoking Status Never Smoker  Smokeless Tobacco Never Used    Labs: Recent Review Flowsheet Data    There is no flowsheet data to display.       Pulmonary Assessment Scores: Pulmonary Assessment Scores    Row Name 03/30/18 1059         ADL UCSD   ADL Phase  Entry     SOB Score total  27     Rest  0     Walk  1     Stairs  3     Bath  1     Dress  2     Shop  1       CAT Score   CAT Score  8       mMRC Score   mMRC Score  2         UCSD: Self-administered rating of dyspnea associated with activities of daily living (ADLs) 6-point scale (0 = "not at all" to 5 = "maximal or unable to do because of breathlessness")  Scoring Scores range from 0 to 120.  Minimally important difference is 5 units  CAT: CAT can identify the health impairment of COPD patients and is better correlated with disease progression.  CAT has a scoring range of zero to 40. The CAT score is classified into four groups of low (less than 10), medium (10 - 20), high (21-30) and very high (31-40) based on the impact level of disease on health status. A CAT score over 10 suggests significant symptoms.  A worsening CAT score could be explained by an exacerbation, poor medication adherence, poor inhaler technique, or progression of COPD or comorbid conditions.  CAT MCID is 2 points  mMRC: mMRC (Modified Medical Research Council) Dyspnea Scale is used to assess the degree of baseline functional disability in patients of respiratory disease due to dyspnea. No minimal important difference is established. A decrease in score  of 1 point or greater is considered a positive change.   Pulmonary Function Assessment: Pulmonary Function Assessment - 03/30/18 1100      Initial Spirometry Results   FVC%  61 %    FEV1%  57 %    FEV1/FVC Ratio  66    Comments  Test done on 07/03/2017      Post Bronchodilator Spirometry Results   FVC%  61 %    FEV1%  64 %    FEV1/FVC Ratio  74    Comments  Test done on 07/03/2017      Breath   Bilateral Breath Sounds  Clear    Shortness of Breath  Yes;Limiting activity       Exercise Target Goals: Exercise Program Goal: Individual exercise prescription set using results from initial 6 min walk test and THRR while considering  patient's activity barriers and safety.   Exercise Prescription Goal: Initial exercise prescription builds to 30-45 minutes a day of aerobic activity, 2-3 days per week.  Home exercise guidelines will be  given to patient during program as part of exercise prescription that the participant will acknowledge.  Activity Barriers & Risk Stratification:   6 Minute Walk: 6 Minute Walk    Row Name 03/30/18 1125         6 Minute Walk   Phase  Initial     Distance  1270 feet     Walk Time  6 minutes     # of Rest Breaks  0     MPH  2.4     METS  2.3     RPE  13     Perceived Dyspnea   2     VO2 Peak  8.03     Symptoms  No     Resting HR  87 bpm     Resting BP  108/64     Resting Oxygen Saturation   94 %     Exercise Oxygen Saturation  during 6 min walk  94 %     Max Ex. HR  120 bpm     Max Ex. BP  138/76     2 Minute Post BP  124/68       Interval HR   1 Minute HR  104     2 Minute HR  109     4 Minute HR  116     5 Minute HR  118     6 Minute HR  120     2 Minute Post HR  99     Interval Heart Rate?  Yes       Interval Oxygen   Interval Oxygen?  Yes     Baseline Oxygen Saturation %  94 %     1 Minute Oxygen Saturation %  94 %     1 Minute Liters of Oxygen  0 L     2 Minute Oxygen Saturation %  95 %     2 Minute Liters of Oxygen  0 L     3 Minute Liters of Oxygen  0 L     4 Minute Oxygen Saturation %  95 %     4 Minute Liters of Oxygen  0 L     5 Minute Oxygen Saturation %  94 %     5 Minute Liters of Oxygen  0 L     6 Minute Oxygen Saturation %  95 %     6 Minute Liters of Oxygen    0 L     2 Minute Post Oxygen Saturation %  96 %     2 Minute Post Liters of Oxygen  0 L       Oxygen Initial Assessment: Oxygen Initial Assessment - 03/30/18 1119      Home Oxygen   Home Oxygen Device  None    Sleep Oxygen Prescription  CPAP    Liters per minute  0    Home Exercise Oxygen Prescription  None    Home at Rest Exercise Oxygen Prescription  None    Compliance with Home Oxygen Use  Yes      Initial 6 min Walk   Oxygen Used  None      Program Oxygen Prescription   Program Oxygen Prescription  None      Intervention   Short Term Goals  To learn and demonstrate  proper use of respiratory medications;To learn and demonstrate proper pursed lip breathing techniques or other breathing techniques.;To learn and understand importance of maintaining oxygen saturations>88%;To learn and understand importance of monitoring SPO2 with pulse oximeter and demonstrate accurate use of the pulse oximeter.    Long  Term Goals  Verbalizes importance of monitoring SPO2 with pulse oximeter and return demonstration;Maintenance of O2 saturations>88%;Exhibits proper breathing techniques, such as pursed lip breathing or other method taught during program session;Compliance with respiratory medication;Demonstrates proper use of MDI's       Oxygen Re-Evaluation: Oxygen Re-Evaluation    Row Name 04/02/18 1145 04/14/18 1450 06/16/18 1137 07/13/18 1135 08/02/18 1554     Program Oxygen Prescription   Program Oxygen Prescription  None  None  None  None  None     Home Oxygen   Home Oxygen Device  None  None  None  None  None   Sleep Oxygen Prescription  CPAP  CPAP  CPAP  CPAP  CPAP   Liters per minute  0  0  0  0  0   Home Exercise Oxygen Prescription  None  None  None  None  None   Home at Rest Exercise Oxygen Prescription  None  None  None  None  None   Compliance with Home Oxygen Use  Yes  Yes  Yes  Yes  Yes     Goals/Expected Outcomes   Short Term Goals  To learn and demonstrate proper use of respiratory medications;To learn and demonstrate proper pursed lip breathing techniques or other breathing techniques.;To learn and understand importance of maintaining oxygen saturations>88%;To learn and understand importance of monitoring SPO2 with pulse oximeter and demonstrate accurate use of the pulse oximeter.  To learn and demonstrate proper use of respiratory medications;To learn and demonstrate proper pursed lip breathing techniques or other breathing techniques.;To learn and understand importance of maintaining oxygen saturations>88%;To learn and understand importance of monitoring  SPO2 with pulse oximeter and demonstrate accurate use of the pulse oximeter.  To learn and demonstrate proper use of respiratory medications;To learn and demonstrate proper pursed lip breathing techniques or other breathing techniques.;To learn and understand importance of maintaining oxygen saturations>88%;To learn and understand importance of monitoring SPO2 with pulse oximeter and demonstrate accurate use of the pulse oximeter.  To learn and demonstrate proper use of respiratory medications;To learn and demonstrate proper pursed lip breathing techniques or other breathing techniques.;To learn and understand importance of maintaining oxygen saturations>88%;To learn and understand importance of monitoring SPO2 with pulse oximeter and demonstrate accurate use of the pulse oximeter.  To learn and demonstrate proper use of respiratory medications;To  learn and demonstrate proper pursed lip breathing techniques or other breathing techniques.;To learn and understand importance of maintaining oxygen saturations>88%;To learn and understand importance of monitoring SPO2 with pulse oximeter and demonstrate accurate use of the pulse oximeter.   Long  Term Goals  Verbalizes importance of monitoring SPO2 with pulse oximeter and return demonstration;Maintenance of O2 saturations>88%;Exhibits proper breathing techniques, such as pursed lip breathing or other method taught during program session;Compliance with respiratory medication;Demonstrates proper use of MDI's  Verbalizes importance of monitoring SPO2 with pulse oximeter and return demonstration;Maintenance of O2 saturations>88%;Exhibits proper breathing techniques, such as pursed lip breathing or other method taught during program session;Compliance with respiratory medication;Demonstrates proper use of MDI's  Verbalizes importance of monitoring SPO2 with pulse oximeter and return demonstration;Maintenance of O2 saturations>88%;Exhibits proper breathing techniques, such  as pursed lip breathing or other method taught during program session;Compliance with respiratory medication;Demonstrates proper use of MDI's  Verbalizes importance of monitoring SPO2 with pulse oximeter and return demonstration;Maintenance of O2 saturations>88%;Exhibits proper breathing techniques, such as pursed lip breathing or other method taught during program session;Compliance with respiratory medication;Demonstrates proper use of MDI's  Verbalizes importance of monitoring SPO2 with pulse oximeter and return demonstration;Maintenance of O2 saturations>88%;Exhibits proper breathing techniques, such as pursed lip breathing or other method taught during program session;Compliance with respiratory medication;Demonstrates proper use of MDI's   Comments  Reviewed PLB technique with pt.  Talked about how it work and it's important to maintaining his exercise saturations.    Gordon Cameron has been using his PLB and finding it helpful.  Still not normal to him yet, but he continues to work on it.  He is staying compliant with his CPAP.   Gordon Cameron has been using his PLB and compliant with his CPAP.  His breathing still has room for improvement.  He has not been using his inspirometer, so we talked about using it more regulalry to help strengthen his intercostals.  Gordon Cameron continues to practice PLB and is using his CPAP. He still reports mild SOB, but nothing abnormal for him  Gordon Cameron has been compliant with his CPAP.  He uses his PLB, but his SOB stays about the same.   Goals/Expected Outcomes  Short: Become more profiecient at using PLB.   Long: Become independent at using PLB.  Short: Continue to work on PLB.  Long: Let the PLB become more natural.   Short: Start using his inspirometer daily.  Long: Continue to work on breathing  Short: continue using PLB, increase physical activity. Long: independence with managing SOB  Short: continue using PLB, increase physical activity. Long: independence with managing SOB       Oxygen Discharge (Final Oxygen Re-Evaluation): Oxygen Re-Evaluation - 08/02/18 1554      Program Oxygen Prescription   Program Oxygen Prescription  None      Home Oxygen   Home Oxygen Device  None    Sleep Oxygen Prescription  CPAP    Liters per minute  0    Home Exercise Oxygen Prescription  None    Home at Rest Exercise Oxygen Prescription  None    Compliance with Home Oxygen Use  Yes      Goals/Expected Outcomes   Short Term Goals  To learn and demonstrate proper use of respiratory medications;To learn and demonstrate proper pursed lip breathing techniques or other breathing techniques.;To learn and understand importance of maintaining oxygen saturations>88%;To learn and understand importance of monitoring SPO2 with pulse oximeter and demonstrate accurate use of the pulse oximeter.      Long  Term Goals  Verbalizes importance of monitoring SPO2 with pulse oximeter and return demonstration;Maintenance of O2 saturations>88%;Exhibits proper breathing techniques, such as pursed lip breathing or other method taught during program session;Compliance with respiratory medication;Demonstrates proper use of MDI's    Comments  Gordon Cameron has been compliant with his CPAP.  He uses his PLB, but his SOB stays about the same.    Goals/Expected Outcomes  Short: continue using PLB, increase physical activity. Long: independence with managing SOB       Initial Exercise Prescription: Initial Exercise Prescription - 03/30/18 1100      Date of Initial Exercise RX and Referring Provider   Date  03/30/18    Referring Provider  Ramachandran      Treadmill   MPH  1.7    Grade  0    Minutes  15    METs  2.3      Recumbant Bike   Level  2    RPM  60    Watts  10    Minutes  15    METs  2.3      NuStep   Level  2    SPM  80    Minutes  15    METs  2.3      Prescription Details   Frequency (times per week)  3    Duration  Progress to 45 minutes of aerobic exercise without signs/symptoms of  physical distress      Intensity   THRR 40-80% of Max Heartrate  108-129    Ratings of Perceived Exertion  11-13    Perceived Dyspnea  0-4      Resistance Training   Training Prescription  Yes    Weight  3 lb    Reps  10-15       Perform Capillary Blood Glucose checks as needed.  Exercise Prescription Changes: Exercise Prescription Changes    Row Name 04/14/18 1300             Response to Exercise   Blood Pressure (Admit)  120/60       Blood Pressure (Exercise)  144/78       Blood Pressure (Exit)  114/72       Heart Rate (Admit)  81 bpm       Heart Rate (Exercise)  97 bpm       Heart Rate (Exit)  95 bpm       Oxygen Saturation (Admit)  95 %       Oxygen Saturation (Exercise)  91 %       Oxygen Saturation (Exit)  93 %       Rating of Perceived Exertion (Exercise)  13       Perceived Dyspnea (Exercise)  2       Symptoms  none       Comments  first full day of exercise       Duration  Progress to 45 minutes of aerobic exercise without signs/symptoms of physical distress       Intensity  THRR unchanged         Progression   Progression  Continue to progress workloads to maintain intensity without signs/symptoms of physical distress.       Average METs  2.32         Resistance Training   Training Prescription  Yes       Weight  3 lbs       Reps  10-15  Interval Training   Interval Training  No         Treadmill   MPH  1.7       Grade  0       Minutes  15       METs  2.3         Recumbant Bike   Level  2       Watts  22       Minutes  15       METs  2.66         NuStep   Level  2       Minutes  15       METs  2         Home Exercise Plan   Plans to continue exercise at  Home (comment) walking and videos       Frequency  Add 3 additional days to program exercise sessions.       Initial Home Exercises Provided  04/14/18 mailed packet          Exercise Comments: Exercise Comments    Row Name 04/02/18 1145           Exercise Comments   First full day of exercise!  Patient was oriented to gym and equipment including functions, settings, policies, and procedures.  Patient's individual exercise prescription and treatment plan were reviewed.  All starting workloads were established based on the results of the 6 minute walk test done at initial orientation visit.  The plan for exercise progression was also introduced and progression will be customized based on patient's performance and goals.          Exercise Goals and Review: Exercise Goals    Row Name 03/30/18 1125             Exercise Goals   Increase Physical Activity  Yes       Intervention  Provide advice, education, support and counseling about physical activity/exercise needs.;Develop an individualized exercise prescription for aerobic and resistive training based on initial evaluation findings, risk stratification, comorbidities and participant's personal goals.       Expected Outcomes  Short Term: Attend rehab on a regular basis to increase amount of physical activity.;Long Term: Add in home exercise to make exercise part of routine and to increase amount of physical activity.;Long Term: Exercising regularly at least 3-5 days a week.       Increase Strength and Stamina  Yes       Intervention  Provide advice, education, support and counseling about physical activity/exercise needs.;Develop an individualized exercise prescription for aerobic and resistive training based on initial evaluation findings, risk stratification, comorbidities and participant's personal goals.       Expected Outcomes  Short Term: Increase workloads from initial exercise prescription for resistance, speed, and METs.;Short Term: Perform resistance training exercises routinely during rehab and add in resistance training at home;Long Term: Improve cardiorespiratory fitness, muscular endurance and strength as measured by increased METs and functional capacity (6MWT)       Able to understand and use rate  of perceived exertion (RPE) scale  Yes       Intervention  Provide education and explanation on how to use RPE scale       Expected Outcomes  Short Term: Able to use RPE daily in rehab to express subjective intensity level;Long Term:  Able to use RPE to guide intensity level when exercising independently       Able to   understand and use Dyspnea scale  Yes       Intervention  Provide education and explanation on how to use Dyspnea scale       Expected Outcomes  Short Term: Able to use Dyspnea scale daily in rehab to express subjective sense of shortness of breath during exertion;Long Term: Able to use Dyspnea scale to guide intensity level when exercising independently       Knowledge and understanding of Target Heart Rate Range (THRR)  Yes       Intervention  Provide education and explanation of THRR including how the numbers were predicted and where they are located for reference       Expected Outcomes  Short Term: Able to state/look up THRR;Short Term: Able to use daily as guideline for intensity in rehab;Long Term: Able to use THRR to govern intensity when exercising independently       Able to check pulse independently  Yes       Intervention  Provide education and demonstration on how to check pulse in carotid and radial arteries.;Review the importance of being able to check your own pulse for safety during independent exercise       Expected Outcomes  Short Term: Able to explain why pulse checking is important during independent exercise;Long Term: Able to check pulse independently and accurately       Understanding of Exercise Prescription  Yes       Intervention  Provide education, explanation, and written materials on patient's individual exercise prescription       Expected Outcomes  Short Term: Able to explain program exercise prescription;Long Term: Able to explain home exercise prescription to exercise independently          Exercise Goals Re-Evaluation : Exercise Goals Re-Evaluation     Row Name 04/02/18 1145 04/14/18 1259 04/30/18 1155 06/16/18 1132 07/13/18 1126     Exercise Goal Re-Evaluation   Exercise Goals Review  Able to understand and use rate of perceived exertion (RPE) scale;Able to understand and use Dyspnea scale;Knowledge and understanding of Target Heart Rate Range (THRR);Understanding of Exercise Prescription  Increase Physical Activity;Increase Strength and Stamina;Understanding of Exercise Prescription  Increase Physical Activity;Increase Strength and Stamina;Understanding of Exercise Prescription  Increase Physical Activity;Increase Strength and Stamina;Understanding of Exercise Prescription  Increase Physical Activity;Increase Strength and Stamina;Understanding of Exercise Prescription   Comments  Reviewed RPE scale, THR and program prescription with pt today.  Pt voiced understanding and was given a copy of goals to take home.   Gordon Cameron is off to a good start for rehab.  He has completed one full day of exercise.  We have sent him a copy of the home exercise guidelines in the mail.  He has been encouraged to walk more at home.  He has been staying active just not necessairly walking.  We will continue to follow his progress at home.   Spoke with pt's wife.  He is doing well at home.  He has been using our videos some for exercise.  He was encouraged to make sure he is getting 30 min at least three times a week.   Gordon Cameron has been doing well at home. He has not been as good at getting in his exercise over the last couple of weeks.  He says that they have been busy doing things around the house, so he is staying active.   Gordon Cameron reports continuing to do well at home. He has been slowly increasing activity, but weather has gotten in the   way. He stays busy around the house with his wife as company   Expected Outcomes  Short: Use RPE daily to regulate intensity. Long: Follow program prescription in THR.  Short: Start to walk at home.  Long: Continue to follow along with  videos.   Short: use videos routines for exercise at home.  Long: Continue to maintain activity.   Short: Get back into routine of exericse.  Long: Conitnue to stay active.  Short: add an extra day of intentional exercise. Long: develop exercise routine that is sustainable.   Row Name 08/02/18 1549             Exercise Goal Re-Evaluation   Exercise Goals Review  Increase Physical Activity;Increase Strength and Stamina;Understanding of Exercise Prescription       Comments  Gordon Cameron continues to do well at home.  He is walking some.  He feels that he has improved since his first session in rehab and feels that he has gotten stronger.  He is feeling good overall.       Expected Outcomes  Short:Continue to make time to exercise. Long: Continue to build stamina.          Discharge Exercise Prescription (Final Exercise Prescription Changes): Exercise Prescription Changes - 04/14/18 1300      Response to Exercise   Blood Pressure (Admit)  120/60    Blood Pressure (Exercise)  144/78    Blood Pressure (Exit)  114/72    Heart Rate (Admit)  81 bpm    Heart Rate (Exercise)  97 bpm    Heart Rate (Exit)  95 bpm    Oxygen Saturation (Admit)  95 %    Oxygen Saturation (Exercise)  91 %    Oxygen Saturation (Exit)  93 %    Rating of Perceived Exertion (Exercise)  13    Perceived Dyspnea (Exercise)  2    Symptoms  none    Comments  first full day of exercise    Duration  Progress to 45 minutes of aerobic exercise without signs/symptoms of physical distress    Intensity  THRR unchanged      Progression   Progression  Continue to progress workloads to maintain intensity without signs/symptoms of physical distress.    Average METs  2.32      Resistance Training   Training Prescription  Yes    Weight  3 lbs    Reps  10-15      Interval Training   Interval Training  No      Treadmill   MPH  1.7    Grade  0    Minutes  15    METs  2.3      Recumbant Bike   Level  2    Watts  22    Minutes   15    METs  2.66      NuStep   Level  2    Minutes  15    METs  2      Home Exercise Plan   Plans to continue exercise at  Home (comment)   walking and videos   Frequency  Add 3 additional days to program exercise sessions.    Initial Home Exercises Provided  04/14/18   mailed packet      Nutrition:  Target Goals: Understanding of nutrition guidelines, daily intake of sodium <1500mg, cholesterol <200mg, calories 30% from fat and 7% or less from saturated fats, daily to have 5 or more servings of fruits and   vegetables.  Biometrics: Pre Biometrics - 03/30/18 1124      Pre Biometrics   Height  5' 8" (1.727 m)    Weight  205 lb 9.6 oz (93.3 kg)    Waist Circumference  40 inches    Hip Circumference  42 inches    Waist to Hip Ratio  0.95 %    BMI (Calculated)  31.27        Nutrition Therapy Plan and Nutrition Goals: Nutrition Therapy & Goals - 05/07/18 1031      Nutrition Therapy   Diet  Low sodium, Heart healthy diet    Protein (specify units)  75-85g    Fiber  30 grams    Whole Grain Foods  3 servings    Saturated Fats  12 max. grams    Fruits and Vegetables  7 servings/day    Sodium  1.5 grams      Personal Nutrition Goals   Nutrition Goal  ST: keep Na beloe 1500-2000mg/d LT: Manage fluid. 6 months out he wants to get past oral surgery, lose wieght.    Comments  Lasik, retaining liquid, hard to determine weight. Low salt, balanced meals, hard because still getting dental work done - bread is hard, cant bite down (only can eat food in small pieces). B: Eggs 1-2x/w, pancakes(white flour) and cereals (instant oatmeal and fruit loops). L: not lunch people, but sometimes chicken and tuna salads. D: lot of chicken (93% lean ground beef sometimes), rice (fried white rice) and a couple of veggies (brocooli). will eat bananas.  Discussed some tips to help with eating such as utilizing sauces to moisten food so it is easier to get down. Discussed MyPlate and tips on how to reduce  sodium and add veggies (how to prepare so he can eat them - boil (reserve water) or preferrably steam), using frozen veggies, etc). Discussed <300mg of NA for B and L becuase wife reports its hard for her to control Na at dinnertime. Wife tries to buy low NA foods sticking to below 17% of DV and they don't use salt but will use NoSalt.       Intervention Plan   Intervention  Prescribe, educate and counsel regarding individualized specific dietary modifications aiming towards targeted core components such as weight, hypertension, lipid management, diabetes, heart failure and other comorbidities.;Nutrition handout(s) given to patient.   Low Na, soft foods ppt and handout to email   Expected Outcomes  Short Term Goal: Understand basic principles of dietary content, such as calories, fat, sodium, cholesterol and nutrients.;Short Term Goal: A plan has been developed with personal nutrition goals set during dietitian appointment.;Long Term Goal: Adherence to prescribed nutrition plan.       Nutrition Assessments:   Nutrition Goals Re-Evaluation:   Nutrition Goals Discharge (Final Nutrition Goals Re-Evaluation):   Psychosocial: Target Goals: Acknowledge presence or absence of significant depression and/or stress, maximize coping skills, provide positive support system. Participant is able to verbalize types and ability to use techniques and skills needed for reducing stress and depression.   Initial Review & Psychosocial Screening: Initial Psych Review & Screening - 03/30/18 1122      Initial Review   Current issues with  Current Stress Concerns    Source of Stress Concerns  Chronic Illness    Comments  He is not able to sleep well at night and is having trouble doing the things he wants to do outside.      Family Dynamics   Good Support System?    Yes    Comments  He has his wife, daughter and son.      Barriers   Psychosocial barriers to participate in program  The patient should benefit  from training in stress management and relaxation.      Screening Interventions   Interventions  Provide feedback about the scores to participant;To provide support and resources with identified psychosocial needs;Program counselor consult;Encouraged to exercise    Expected Outcomes  Long Term Goal: Stressors or current issues are controlled or eliminated.;Short Term goal: Utilizing psychosocial counselor, staff and physician to assist with identification of specific Stressors or current issues interfering with healing process. Setting desired goal for each stressor or current issue identified.;Short Term goal: Identification and review with participant of any Quality of Life or Depression concerns found by scoring the questionnaire.;Long Term goal: The participant improves quality of Life and PHQ9 Scores as seen by post scores and/or verbalization of changes       Quality of Life Scores:  Scores of 19 and below usually indicate a poorer quality of life in these areas.  A difference of  2-3 points is a clinically meaningful difference.  A difference of 2-3 points in the total score of the Quality of Life Index has been associated with significant improvement in overall quality of life, self-image, physical symptoms, and general health in studies assessing change in quality of life.  PHQ-9: Recent Review Flowsheet Data    Depression screen PHQ 2/9 03/30/2018   Decreased Interest 0   Down, Depressed, Hopeless 0   PHQ - 2 Score 0   Altered sleeping 1   Tired, decreased energy 1   Change in appetite 1   Feeling bad or failure about yourself  0   Trouble concentrating 0   Moving slowly or fidgety/restless 0   Suicidal thoughts 0   PHQ-9 Score 3   Difficult doing work/chores Not difficult at all     Interpretation of Total Score  Total Score Depression Severity:  1-4 = Minimal depression, 5-9 = Mild depression, 10-14 = Moderate depression, 15-19 = Moderately severe depression, 20-27 = Severe  depression   Psychosocial Evaluation and Intervention:   Psychosocial Re-Evaluation: Psychosocial Re-Evaluation    Row Name 04/14/18 1452 06/16/18 1134 07/13/18 1133 08/02/18 1550       Psychosocial Re-Evaluation   Current issues with  Current Stress Concerns  Current Stress Concerns  Current Stress Concerns  Current Stress Concerns    Comments  Gordon Cameron is doing his best to say positive.  He is stressed with the current health scare, but handling it the best that he can.  He is doing the best he can and staying active/  Ah has been doing well mentally. They have been staying home and keeping busy with things around the house.  He has gotten away from exercise but will try to get back into the routine.   Gordon Cameron reports "feeling good considering the circumstances" and "doing as good as everyone else." He and his wife stay busy around the house which helps him feel good  Gordon Cameron is doing okay.  He tries to stay busy and is able to do some things.  His health continues to be his biggest stressor.    Expected Outcomes  Short: Continue to be postive.  Long: Continue to stay active to help cope with stress.   Short: Continue to stay active.  Long; Continue to stay positive.   Short: add extra day of exericse to help with mood. Long:   stay upbeat and find a "new normal" during all whats going on with COVID  Short: Exercise regularly for mood boost.  Long: Continue to stay positive.    Interventions  Encouraged to attend Pulmonary Rehabilitation for the exercise  -  Encouraged to attend Pulmonary Rehabilitation for the exercise  -    Continue Psychosocial Services   Follow up required by staff  -  -  -       Psychosocial Discharge (Final Psychosocial Re-Evaluation): Psychosocial Re-Evaluation - 08/02/18 1550      Psychosocial Re-Evaluation   Current issues with  Current Stress Concerns    Comments  Gordon Cameron is doing okay.  He tries to stay busy and is able to do some things.  His health continues to be  his biggest stressor.    Expected Outcomes  Short: Exercise regularly for mood boost.  Long: Continue to stay positive.       Education: Education Goals: Education classes will be provided on a weekly basis, covering required topics. Participant will state understanding/return demonstration of topics presented.  Learning Barriers/Preferences: Learning Barriers/Preferences - 03/30/18 1126      Learning Barriers/Preferences   Learning Barriers  Sight   wears glasses   Learning Preferences  None       Education Topics:  Initial Evaluation Education: - Verbal, written and demonstration of respiratory meds, oximetry and breathing techniques. Instruction on use of nebulizers and MDIs and importance of monitoring MDI activations.   Pulmonary Rehab from 03/30/2018 in [facility] Cardiac and Pulmonary Rehab  Date  03/30/18  Educator  [facility]  Instruction Review Code  1- Verbalizes Understanding      General Nutrition Guidelines/Fats and Fiber: -Group instruction provided by verbal, written material, models and posters to present the general guidelines for heart healthy nutrition. Gives an explanation and review of dietary fats and fiber.   Controlling Sodium/Reading Food Labels: -Group verbal and written material supporting the discussion of sodium use in heart healthy nutrition. Review and explanation with models, verbal and written materials for utilization of the food label.   Exercise Physiology & General Exercise Guidelines: - Group verbal and written instruction with models to review the exercise physiology of the cardiovascular system and associated critical values. Provides general exercise guidelines with specific guidelines to those with heart or lung disease.    Aerobic Exercise & Resistance Training: - Gives group verbal and written instruction on the various components of exercise. Focuses on aerobic and resistive training programs and the benefits of this training and how to safely  progress through these programs.   Flexibility, Balance, Mind/Body Relaxation: Provides group verbal/written instruction on the benefits of flexibility and balance training, including mind/body exercise modes such as yoga, pilates and tai chi.  Demonstration and skill practice provided.   Stress and Anxiety: - Provides group verbal and written instruction about the health risks of elevated stress and causes of high stress.  Discuss the correlation between heart/lung disease and anxiety and treatment options. Review healthy ways to manage with stress and anxiety.   Depression: - Provides group verbal and written instruction on the correlation between heart/lung disease and depressed mood, treatment options, and the stigmas associated with seeking treatment.   Exercise & Equipment Safety: - Individual verbal instruction and demonstration of equipment use and safety with use of the equipment.   Pulmonary Rehab from 03/30/2018 in [facility] Cardiac and Pulmonary Rehab  Date  03/30/18  Educator  [facility]  Instruction Review Code  1- Verbalizes Understanding  Infection Prevention: - Provides verbal and written material to individual with discussion of infection control including proper hand washing and proper equipment cleaning during exercise session.   Pulmonary Rehab from 03/30/2018 in [facility] Cardiac and Pulmonary Rehab  Date  03/30/18  Educator  [facility]  Instruction Review Code  1- Verbalizes Understanding      Falls Prevention: - Provides verbal and written material to individual with discussion of falls prevention and safety.   Pulmonary Rehab from 03/30/2018 in [facility] Cardiac and Pulmonary Rehab  Date  03/30/18  Educator  [facility]  Instruction Review Code  1- Verbalizes Understanding      Diabetes: - Individual verbal and written instruction to review signs/symptoms of diabetes, desired ranges of glucose level fasting, after meals and with exercise. Advice that pre and post exercise glucose checks  will be done for 3 sessions at entry of program.   Chronic Lung Diseases: - Group verbal and written instruction to review updates, respiratory medications, advancements in procedures and treatments. Discuss use of supplemental oxygen including available portable oxygen systems, continuous and intermittent flow rates, concentrators, personal use and safety guidelines. Review proper use of inhaler and spacers. Provide informative websites for self-education.    Energy Conservation: - Provide group verbal and written instruction for methods to conserve energy, plan and organize activities. Instruct on pacing techniques, use of adaptive equipment and posture/positioning to relieve shortness of breath.   Triggers and Exacerbations: - Group verbal and written instruction to review types of environmental triggers and ways to prevent exacerbations. Discuss weather changes, air quality and the benefits of nasal washing. Review warning signs and symptoms to help prevent infections. Discuss techniques for effective airway clearance, coughing, and vibrations.   AED/CPR: - Group verbal and written instruction with the use of models to demonstrate the basic use of the AED with the basic ABC's of resuscitation.   Anatomy and Physiology of the Lungs: - Group verbal and written instruction with the use of models to provide basic lung anatomy and physiology related to function, structure and complications of lung disease.   Anatomy & Physiology of the Heart: - Group verbal and written instruction and models provide basic cardiac anatomy and physiology, with the coronary electrical and arterial systems. Review of Valvular disease and Heart Failure   Cardiac Medications: - Group verbal and written instruction to review commonly prescribed medications for heart disease. Reviews the medication, class of the drug, and side effects.   Know Your Numbers and Risk Factors: -Group verbal and written instruction  about important numbers in your health.  Discussion of what are risk factors and how they play a role in the disease process.  Review of Cholesterol, Blood Pressure, Diabetes, and BMI and the role they play in your overall health.   Sleep Hygiene: -Provides group verbal and written instruction about how sleep can affect your health.  Define sleep hygiene, discuss sleep cycles and impact of sleep habits. Review good sleep hygiene tips.    Other: -Provides group and verbal instruction on various topics (see comments)    Knowledge Questionnaire Score: Knowledge Questionnaire Score - 03/30/18 1101      Knowledge Questionnaire Score   Pre Score  16/18   reviewed with patient       Core Components/Risk Factors/Patient Goals at Admission: Personal Goals and Risk Factors at Admission - 03/30/18 1126      Core Components/Risk Factors/Patient Goals on Admission    Weight Management  Yes;Weight Loss    Intervention  Weight   Management: Develop a combined nutrition and exercise program designed to reach desired caloric intake, while maintaining appropriate intake of nutrient and fiber, sodium and fats, and appropriate energy expenditure required for the weight goal.;Weight Management: Provide education and appropriate resources to help participant work on and attain dietary goals.    Admit Weight  205 lb (93 kg)    Goal Weight: Short Term  200 lb (90.7 kg)    Goal Weight: Long Term  190 lb (86.2 kg)    Expected Outcomes  Long Term: Adherence to nutrition and physical activity/exercise program aimed toward attainment of established weight goal;Short Term: Continue to assess and modify interventions until short term weight is achieved;Weight Loss: Understanding of general recommendations for a balanced deficit meal plan, which promotes 1-2 lb weight loss per week and includes a negative energy balance of 500-1000 kcal/d;Understanding recommendations for meals to include 15-35% energy as protein,  25-35% energy from fat, 35-60% energy from carbohydrates, less than 200mg of dietary cholesterol, 20-35 gm of total fiber daily;Understanding of distribution of calorie intake throughout the day with the consumption of 4-5 meals/snacks;Weight Maintenance: Understanding of the daily nutrition guidelines, which includes 25-35% calories from fat, 7% or less cal from saturated fats, less than 200mg cholesterol, less than 1.5gm of sodium, & 5 or more servings of fruits and vegetables daily    Improve shortness of breath with ADL's  Yes    Intervention  Provide education, individualized exercise plan and daily activity instruction to help decrease symptoms of SOB with activities of daily living.    Expected Outcomes  Short Term: Improve cardiorespiratory fitness to achieve a reduction of symptoms when performing ADLs;Long Term: Be able to perform more ADLs without symptoms or delay the onset of symptoms       Core Components/Risk Factors/Patient Goals Review:  Goals and Risk Factor Review    Row Name 04/14/18 1453 06/16/18 1136 07/13/18 1129 08/02/18 1552       Core Components/Risk Factors/Patient Goals Review   Personal Goals Review  Weight Management/Obesity;Hypertension;Improve shortness of breath with ADL's  Weight Management/Obesity;Hypertension;Improve shortness of breath with ADL's  Weight Management/Obesity;Hypertension;Improve shortness of breath with ADL's  Weight Management/Obesity;Hypertension;Improve shortness of breath with ADL's    Review  Ubaldo has been doing well. His weight is down some with his Lasix.  His blood pressures have been good as well.  His SOB is improving and overall he is feeling good.   Gordon Cameron has been doing well at home.  He has been able to lose some weight and keep it down.  His pressures continues to do well.  He is still having some SOB, but doing well overall.   Gordon Cameron reports no issues at home managing his risk factors. His weight loss is going well. His SOB is doing  okay, he said the weather hasnt been helping..  Gordon Cameron has been struggling with his weight some and attributes it to increased fluid.  He is now on 40 mg of Lasix 3x a day.  It seems to help most days, but some days he still feels heavy.  He checks his pressures at least twice a day and they have been staying pretty good.  He does get a few reading in the 90s but he denies any symptoms with these.    Expected Outcomes  Short: Continue to work on weight loss.  Long: Continue to improve SOB  Short: Continue to work on weight.  Long: Continue to monitor risk factors.   Short: continue to   work on weight loss. Long: independence in management of risk factors.  Short: Continue to work on weight loss.  Long: Continue to monitor risk factors.       Core Components/Risk Factors/Patient Goals at Discharge (Final Review):  Goals and Risk Factor Review - 08/02/18 1552      Core Components/Risk Factors/Patient Goals Review   Personal Goals Review  Weight Management/Obesity;Hypertension;Improve shortness of breath with ADL's    Review  Alpha has been struggling with his weight some and attributes it to increased fluid.  He is now on 40 mg of Lasix 3x a day.  It seems to help most days, but some days he still feels heavy.  He checks his pressures at least twice a day and they have been staying pretty good.  He does get a few reading in the 90s but he denies any symptoms with these.    Expected Outcomes  Short: Continue to work on weight loss.  Long: Continue to monitor risk factors.       ITP Comments: ITP Comments    Row Name 03/30/18 1057 04/12/18 0931 04/26/18 1024 08/19/18 1150 09/01/18 0553   ITP Comments  Medical Evaluation completed. Chart sent for review and changes to Dr. Mark Miller Director of [facility]. Diagnosis can be found in [facility] encounte 03/23/2018  Our program is currently closed due to COVID-19.  We are communicating with patient via phone calls and emails.  30 day review completed. ITP sent to  Dr. Mark Miller for review,changes as needed and signature. Continue with ITP unless changes directed by Dr. Miller.   Gordon Cameron has been doing the same across the board.  He said that he has not noticed any big changes.  He is now scheduled to return on 8/10 at 1045.  30 Day Review Completed today. Continue with ITP unless changed by Medical Director review.      Comments:  

## 2018-09-01 NOTE — Progress Notes (Signed)
Daily Session Note  Patient Details  Name: Gordon Cameron MRN: [id number] Date of Birth: 11/19/1934 Referring Provider:     Pulmonary Rehab from 03/30/2018 in [facility] Cardiac and Pulmonary Rehab  Referring Provider  Ramachandran      Encounter Date: 09/01/2018  Check In:      Social History   Tobacco Use  Smoking Status Never Smoker  Smokeless Tobacco Never Used    Goals Met:  Independence with exercise equipment Exercise tolerated well No report of cardiac concerns or symptoms Strength training completed today  Goals Unmet:  Not Applicable  Comments: Pt able to follow exercise prescription today without complaint.  Will continue to monitor for progression.    Dr. Mark Miller is Medical Director for [facility] and [facility]. 

## 2018-09-02 ENCOUNTER — Encounter: Payer: Medicare Other | Admitting: *Deleted

## 2018-09-02 DIAGNOSIS — J449 Chronic obstructive pulmonary disease, unspecified: Secondary | ICD-10-CM | POA: Diagnosis not present

## 2018-09-02 NOTE — Progress Notes (Signed)
Daily Session Note  Patient Details  Name: Gordon Cameron MRN: [id number] Date of Birth: 09/17/1934 Referring Provider:     Pulmonary Rehab from 03/30/2018 in [facility] Cardiac and Pulmonary Rehab  Referring Provider  Ashby Dawes      Encounter Date: 09/02/2018  Check In: Session Check In - 09/02/18 1545      Check-In   Supervising physician immediately available to respond to emergencies  See telemetry face sheet for immediately available ER MD    Location  [facility]-Cardiac & Pulmonary Rehab    Staff Present  Heath Lark, RN, BSN, CCRP;Joseph Hood RCP,RRT,BSRT;Jessica [place], [place], [place], [place], CCET    Virtual Visit  No    Medication changes reported      No    Fall or balance concerns reported     No    Warm-up and Cool-down  Performed on first and last piece of equipment    Resistance Training Performed  Yes    VAD Patient?  No    PAD/SET Patient?  No      Pain Assessment   Currently in Pain?  No/denies          Social History   Tobacco Use  Smoking Status Never Smoker  Smokeless Tobacco Never Used    Goals Met:  Independence with exercise equipment Exercise tolerated well No report of cardiac concerns or symptoms Strength training completed today  Goals Unmet:  Not Applicable  Comments: Pt able to follow exercise prescription today without complaint.  Will continue to monitor for progression.    Dr. Emily Filbert is Medical Director for [facility] and [facility].

## 2018-09-08 ENCOUNTER — Encounter: Payer: Medicare Other | Admitting: *Deleted

## 2018-09-08 ENCOUNTER — Other Ambulatory Visit: Payer: Self-pay

## 2018-09-08 DIAGNOSIS — J449 Chronic obstructive pulmonary disease, unspecified: Secondary | ICD-10-CM | POA: Diagnosis not present

## 2018-09-08 NOTE — Progress Notes (Signed)
Daily Session Note  Patient Details  Name: Gordon Cameron MRN: [id number] Date of Birth: Jul 30, 1934 Referring Provider:     Pulmonary Rehab from 03/30/2018 in [facility] Cardiac and Pulmonary Rehab  Referring Provider  Ashby Dawes      Encounter Date: 09/08/2018  Check In: Session Check In - 09/08/18 1646      Check-In   Supervising physician immediately available to respond to emergencies  See telemetry face sheet for immediately available ER MD    Location  [facility]-Cardiac & Pulmonary Rehab    Staff Present  Heath Lark, RN, BSN, CCRP;Jeanna Durrell BS, Exercise Physiologist;Amanda Oletta Darter, BA, ACSM CEP, Exercise Physiologist    Virtual Visit  No    Medication changes reported      No    Fall or balance concerns reported     No    Warm-up and Cool-down  Performed on first and last piece of equipment    Resistance Training Performed  Yes    PAD/SET Patient?  No      Pain Assessment   Currently in Pain?  No/denies          Social History   Tobacco Use  Smoking Status Never Smoker  Smokeless Tobacco Never Used    Goals Met:  Independence with exercise equipment Exercise tolerated well No report of cardiac concerns or symptoms Strength training completed today  Goals Unmet:  Not Applicable  Comments: Doing well with Exercise Progression   Dr. Emily Filbert is Medical Director for [place] and [facility].

## 2018-09-09 DIAGNOSIS — J449 Chronic obstructive pulmonary disease, unspecified: Secondary | ICD-10-CM

## 2018-09-09 NOTE — Progress Notes (Signed)
Daily Session Note  Patient Details  Name: Gordon Cameron MRN: [id number] Date of Birth: 1934-04-13 Referring Provider:     Pulmonary Rehab from 03/30/2018 in [facility] Cardiac and Pulmonary Rehab  Referring Provider  Ashby Dawes      Encounter Date: 09/09/2018  Check In:      Social History   Tobacco Use  Smoking Status Never Smoker  Smokeless Tobacco Never Used    Goals Met:  Independence with exercise equipment Exercise tolerated well No report of cardiac concerns or symptoms Strength training completed today  Goals Unmet:  Not Applicable  Comments: Pt able to follow exercise prescription today without complaint.  Will continue to monitor for progression.    Dr. Emily Filbert is Medical Director for [place] and [facility].

## 2018-09-10 ENCOUNTER — Telehealth: Payer: Self-pay

## 2018-09-10 NOTE — Telephone Encounter (Signed)

## 2018-09-10 NOTE — Progress Notes (Signed)
* [facility]     Assessment and Plan:  Dyspnea on exertion. - Patient is experiencing a progressive decline in functional status, which I suspect is multifactorial due to cardiac, pulmonary disease, deconditioning/debility.  Patient is at high risk of exacerbations/complications due to declining respiratory status. - continue pulmonary rehab.  COPD/chronic bronchitis. - Patient developed significant thrush with inhaled steroids, no significant improvement with Advair. - use spiriva once daily if able. Will give sample in the meantime.   Obstructive sleep apnea. - Doing well with CPAP, got a new machine and 2019. - Download shows residual apnea index of 26, suspect central sleep apnea. Will send for cpap titration study.  Patient is uncertain as to whether he wants to go through another sleep study, he notes that his age is 61, he feels that he is doing fairly well not sure how much more medical therapy he wants to go through.  He would get back to [place] if he wants to go ahead and schedule it.  Meds ordered this encounter  Medications   Aclidinium Bromide (TUDORZA PRESSAIR) 400 MCG/ACT AEPB    Sig: Inhale 1 puff into the lungs daily.    Dispense:  1 each    Refill:  0    Order Specific Question:   Lot Number?    Answer:   EAAB   Orders Placed This Encounter  Procedures   Cpap titration   Return in about 3 months (around 12/14/2018).    Date: 09/10/2018  MRN# [id number] Gordon Cameron 05-08-1934   Gordon Cameron is a 84 y.o. old male seen in follow up for chief complaint of  Chief Complaint  Patient presents with   Follow-up    Reports no concerns with his Cpap. Currently using albuterol 3-4x/week.     HPI:  Gordon Cameron is a 84 y.o.  male  with obstructive sleep apnea, and asthma, positive ANA.  At his last visit he was referred to pulmonary rehab though this has not been possible due to [place] restrictions.  He was started on Spiriva, asked  to continue CPAP.  Since his last visit he feels that his breathing is about the same and not bothering him much other than when he bends over.  He has restarted pulmonary rehab since it reopened. He drives a car, he lives at home, he can walk around a [employer], without much of a problem.  He is not taking spiriva due to cost, he is using albuterol 2 to 3 times per week.    He was given a course of nystatin for oral thrush.  He was initially being followed for OSA, but last year his breathing declined and he was thought to have possible asthma. He feels that his breathing is progressively worsening slowly over the past year and really over the past few weeks.  He was initially on advair for about 2 months and was not helping and he lost his voice which came back when he stopped it after some dental work. He has albuterol that he uses rarely. His breathing is worse when he bends over.  He has gained about 15 pounds, and was having leg swelling. He was then put back on lasix.  He has not gone to cardiac rehab or pulm rehab.  He sees [facility], EF of 50% with percardial effusion.   He has never smoked, his father was a heavy smoker. 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  Greater than 50% of the 40 minute visit was spent in counseling/coordination of care regarding dyspnea.   **Auto CPAP download 08/11/2018-09/09/2018>> raw data personally reviewed.  Average usage on days used is 7 hours 20 minutes.  Days used is 30/30 days.  CPAP mean pressure is 11.4, CPAP average pressure 13.5, 90th percentile is 14.  Residual AHI is 26.  Overall this shows excellent compliance with elevated AHI. **CT chest 06/23/17>>images personally review, normal lungs, mild pericardial effusion.  **CBC 06/23/17>> Abs Eos 800.  **PFT 07/03/2017 tracings personally reviewed, FVC is 61% predicted, FEV1 is 57% predicted, there is no significant improvement with bronchodilator.  Ratio is 66%.  Flow volume loop is  unremarkable.  TLC is 73% predicted, ratio is 109%.  DLCO is reduced at 41%. Overall this test shows moderate obstructive lung disease with FEV1 of 57%.  There is also evidence of restriction with reduced FVC.   Medication:    Current Outpatient Medications:    albuterol (VENTOLIN HFA) 108 (90 Base) MCG/ACT inhaler, Inhale 2 puffs into the lungs every 6 (six) hours as needed for wheezing or shortness of breath., Disp: 1 Inhaler, Rfl: 5   cetirizine (ZYRTEC) 10 MG tablet, Take 10 mg by mouth daily as needed. , Disp: , Rfl:    finasteride (PROSCAR) 5 MG tablet, Once a day, Disp: , Rfl:    fluticasone (FLONASE) 50 MCG/ACT nasal spray, Place 2 sprays into both nostrils daily., Disp: , Rfl:    furosemide (LASIX) 20 MG tablet, Take 40 mg by mouth daily. , Disp: , Rfl: 2   tiotropium (SPIRIVA HANDIHALER) 18 MCG inhalation capsule, Place 1 capsule (18 mcg total) into inhaler and inhale daily., Disp: 30 capsule, Rfl: 2   Allergies:  Patient has no known allergies.   Review of Systems:  Constitutional: Feels well. Cardiovascular: Denies chest pain, exertional chest pain.  Pulmonary: Denies hemoptysis, pleuritic chest pain.   The remainder of systems were reviewed and were found to be negative other than what is documented in the HPI.    Physical Examination:   VS: BP 118/80    Pulse 76    Temp (!) 97.3 F (36.3 C) (Temporal)    Ht 5' 8"  (1.727 m)    Wt 193 lb (87.5 kg)    SpO2 100%    BMI 29.35 kg/m   General Appearance: No distress  Neuro:without focal findings, mental status, speech normal, alert and oriented HEENT: PERRLA, EOM intact Pulmonary: No wheezing, No rales  CardiovascularNormal S1,S2.  No m/r/g.  Abdomen: Benign, Soft, non-tender, No masses Renal:  No costovertebral tenderness  GU:  No performed at this time. Endoc: No evident thyromegaly, no signs of acromegaly or Cushing features Skin:   warm, no rashes, no ecchymosis  Extremities: normal, no cyanosis,  clubbing.     LABORATORY PANEL:   CBC No results for input(s): WBC, HGB, HCT, PLT in the last 168 hours. ------------------------------------------------------------------------------------------------------------------  Chemistries  No results for input(s): NA, K, CL, CO2, GLUCOSE, BUN, CREATININE, CALCIUM, MG, AST, ALT, ALKPHOS, BILITOT in the last 168 hours.  Invalid input(s): GFRCGP ------------------------------------------------------------------------------------------------------------------  Cardiac Enzymes No results for input(s): TROPONINI in the last 168 hours. ------------------------------------------------------------  RADIOLOGY:   No results found for this or any previous visit. Results for orders placed during the hospital encounter of 07/06/17  DG Chest 2 View   Narrative CLINICAL DATA:  Shortness of breath, cough, weakness.  EXAM: CHEST - 2 VIEW  COMPARISON:  CT chest 06/23/2017.  FINDINGS: Trachea is  midline. Heart size normal. Lungs are somewhat low in volume with tiny bilateral pleural effusions.  IMPRESSION: Tiny bilateral pleural effusions.   Electronically Signed   By: Lorin Picket M.D.   On: 07/06/2017 15:19    ------------------------------------------------------------------------------------------------------------------  Thank  you for allowing [facility] to assist in the care of your patient. Our recommendations are noted above.  Please contact us if we can be of further service.   Marda Stalker, M.D., F.C.C.P.  Board Certified in Internal Medicine, Pulmonary Medicine, [place], and Sleep Medicine.  [facility] Office Number: [phone]  09/10/2018

## 2018-09-13 ENCOUNTER — Other Ambulatory Visit: Payer: Self-pay

## 2018-09-13 ENCOUNTER — Encounter: Payer: Self-pay | Admitting: Internal Medicine

## 2018-09-13 ENCOUNTER — Ambulatory Visit (INDEPENDENT_AMBULATORY_CARE_PROVIDER_SITE_OTHER): Payer: Medicare Other | Admitting: Internal Medicine

## 2018-09-13 VITALS — BP 118/80 | HR 76 | Temp 97.3°F | Ht 68.0 in | Wt 193.0 lb

## 2018-09-13 DIAGNOSIS — G4731 Primary central sleep apnea: Secondary | ICD-10-CM | POA: Diagnosis not present

## 2018-09-13 MED ORDER — TUDORZA PRESSAIR 400 MCG/ACT IN AEPB: 1.0000 | INHALATION_SPRAY | Freq: Two times a day (BID) | RESPIRATORY_TRACT | 0 refills | Status: DC

## 2018-09-13 MED ORDER — TUDORZA PRESSAIR 400 MCG/ACT IN AEPB: 1.0000 | INHALATION_SPRAY | Freq: Every day | RESPIRATORY_TRACT | 0 refills | Status: DC

## 2018-09-13 NOTE — Patient Instructions (Addendum)
Use albuterol 2 to 3 times per week as needed.  Will give you a sample of inhaler to be used daily.   Will send you for a sleep study in a sleep lab.

## 2018-09-16 ENCOUNTER — Encounter: Payer: Medicare Other | Admitting: *Deleted

## 2018-09-16 ENCOUNTER — Other Ambulatory Visit: Payer: Self-pay

## 2018-09-16 DIAGNOSIS — J449 Chronic obstructive pulmonary disease, unspecified: Secondary | ICD-10-CM | POA: Diagnosis not present

## 2018-09-16 NOTE — Progress Notes (Signed)
Daily Session Note  Patient Details  Name: Gordon Cameron MRN: [id number] Date of Birth: 1934-04-26 Referring Provider:     Pulmonary Rehab from 03/30/2018 in [facility]  Referring Provider  Ashby Dawes      Encounter Date: 09/16/2018  Check In: Session Check In - 09/16/18 3818      Check-In   Supervising physician immediately available to respond to emergencies  See telemetry face sheet for immediately available ER MD    Location  [facility]    Staff Present  Renita Papa, RN BSN;Jessica Luan Pulling, MA, RCEP, CCRP, CCET;Jeanna Durrell BS, Exercise Physiologist    Virtual Visit  No    Medication changes reported      No    Fall or balance concerns reported     No    Warm-up and Cool-down  Performed on first and last piece of equipment    Resistance Training Performed  Yes    VAD Patient?  No    PAD/SET Patient?  No      Pain Assessment   Currently in Pain?  No/denies          Social History   Tobacco Use  Smoking Status Never Smoker  Smokeless Tobacco Never Used    Goals Met:  Independence with exercise equipment Exercise tolerated well No report of cardiac concerns or symptoms Strength training completed today  Goals Unmet:  Not Applicable  Comments: Pt able to follow exercise prescription today without complaint.  Will continue to monitor for progression.    Dr. Emily Filbert is Medical Director for [facility].

## 2018-09-23 ENCOUNTER — Encounter: Payer: Self-pay | Admitting: *Deleted

## 2018-09-23 DIAGNOSIS — J449 Chronic obstructive pulmonary disease, unspecified: Secondary | ICD-10-CM

## 2018-09-28 NOTE — Progress Notes (Signed)
Unable to complete nutrition 30-day re-eval cycle due to inconsistent attendence

## 2018-09-29 ENCOUNTER — Encounter: Payer: Self-pay | Admitting: *Deleted

## 2018-09-29 ENCOUNTER — Encounter: Payer: Medicare Other | Attending: Internal Medicine | Admitting: *Deleted

## 2018-09-29 ENCOUNTER — Other Ambulatory Visit: Payer: Self-pay

## 2018-09-29 DIAGNOSIS — J449 Chronic obstructive pulmonary disease, unspecified: Secondary | ICD-10-CM

## 2018-09-29 NOTE — Progress Notes (Signed)
Pulmonary Individual Treatment Plan  Patient Details  Name: Gordon Cameron MRN: [id number] Date of Birth: [birth year] Referring Provider:     Pulmonary Rehab from 03/30/2018 in [facility] Cardiac and Pulmonary Rehab  Referring Provider  Ramachandran      Initial Encounter Date:    Pulmonary Rehab from 03/30/2018 in [facility] Cardiac and Pulmonary Rehab  Date  03/30/18      Visit Diagnosis: Chronic obstructive pulmonary disease, unspecified COPD type ([place])  Patient's Home Medications on Admission:  Current Outpatient Medications:  .  Aclidinium Bromide (TUDORZA PRESSAIR) 400 MCG/ACT AEPB, Inhale 1 puff into the lungs 2 (two) times daily., Disp: 1 each, Rfl: 0 .  albuterol (VENTOLIN HFA) 108 (90 Base) MCG/ACT inhaler, Inhale 2 puffs into the lungs every 6 (six) hours as needed for wheezing or shortness of breath., Disp: 1 Inhaler, Rfl: 5 .  cetirizine (ZYRTEC) 10 MG tablet, Take 10 mg by mouth daily as needed. , Disp: , Rfl:  .  finasteride (PROSCAR) 5 MG tablet, Once a day, Disp: , Rfl:  .  fluticasone (FLONASE) 50 MCG/ACT nasal spray, Place 2 sprays into both nostrils daily., Disp: , Rfl:  .  furosemide (LASIX) 20 MG tablet, Take 40 mg by mouth daily. , Disp: , Rfl: 2 .  spironolactone (ALDACTONE) 25 MG tablet, Take 25 mg by mouth daily., Disp: , Rfl:   Past Medical History: Past Medical History:  Diagnosis Date  . BPH (benign prostatic hyperplasia)   . Hyperlipidemia   . OSA (obstructive sleep apnea)   . Reactive airway disease   . Restless leg     Tobacco Use: Social History   Tobacco Use  Smoking Status Never Smoker  Smokeless Tobacco Never Used    Labs: Recent Review Flowsheet Data    There is no flowsheet data to display.       Pulmonary Assessment Scores:   UCSD: Self-administered rating of dyspnea associated with activities of daily living (ADLs) 6-point scale (0 = "not at all" to 5 = "maximal or unable to do because of breathlessness")  Scoring Scores range  from 0 to 120.  Minimally important difference is 5 units  CAT: CAT can identify the health impairment of COPD patients and is better correlated with disease progression.  CAT has a scoring range of zero to 40. The CAT score is classified into four groups of low (less than 10), medium (10 - 20), high (21-30) and very high (31-40) based on the impact level of disease on health status. A CAT score over 10 suggests significant symptoms.  A worsening CAT score could be explained by an exacerbation, poor medication adherence, poor inhaler technique, or progression of COPD or comorbid conditions.  CAT MCID is 2 points  mMRC: mMRC (Modified Medical Research Council) Dyspnea Scale is used to assess the degree of baseline functional disability in patients of respiratory disease due to dyspnea. No minimal important difference is established. A decrease in score of 1 point or greater is considered a positive change.   Pulmonary Function Assessment:   Exercise Target Goals: Exercise Program Goal: Individual exercise prescription set using results from initial 6 min walk test and THRR while considering  patient's activity barriers and safety.   Exercise Prescription Goal: Initial exercise prescription builds to 30-45 minutes a day of aerobic activity, 2-3 days per week.  Home exercise guidelines will be given to patient during program as part of exercise prescription that the participant will acknowledge.  Activity Barriers & Risk Stratification:  6 Minute Walk:  Oxygen Initial Assessment:   Oxygen Re-Evaluation: Oxygen Re-Evaluation    Row Name 04/02/18 1145 04/14/18 1450 06/16/18 1137 07/13/18 1135 08/02/18 1554     Program Oxygen Prescription   Program Oxygen Prescription  None  None  None  None  None     Home Oxygen   Home Oxygen Device  None  None  None  None  None   Sleep Oxygen Prescription  CPAP  CPAP  CPAP  CPAP  CPAP   Liters per minute  0  0  0  0  0   Home Exercise Oxygen  Prescription  None  None  None  None  None   Home at Rest Exercise Oxygen Prescription  None  None  None  None  None   Compliance with Home Oxygen Use  Yes  Yes  Yes  Yes  Yes     Goals/Expected Outcomes   Short Term Goals  To learn and demonstrate proper use of respiratory medications;To learn and demonstrate proper pursed lip breathing techniques or other breathing techniques.;To learn and understand importance of maintaining oxygen saturations>88%;To learn and understand importance of monitoring SPO2 with pulse oximeter and demonstrate accurate use of the pulse oximeter.  To learn and demonstrate proper use of respiratory medications;To learn and demonstrate proper pursed lip breathing techniques or other breathing techniques.;To learn and understand importance of maintaining oxygen saturations>88%;To learn and understand importance of monitoring SPO2 with pulse oximeter and demonstrate accurate use of the pulse oximeter.  To learn and demonstrate proper use of respiratory medications;To learn and demonstrate proper pursed lip breathing techniques or other breathing techniques.;To learn and understand importance of maintaining oxygen saturations>88%;To learn and understand importance of monitoring SPO2 with pulse oximeter and demonstrate accurate use of the pulse oximeter.  To learn and demonstrate proper use of respiratory medications;To learn and demonstrate proper pursed lip breathing techniques or other breathing techniques.;To learn and understand importance of maintaining oxygen saturations>88%;To learn and understand importance of monitoring SPO2 with pulse oximeter and demonstrate accurate use of the pulse oximeter.  To learn and demonstrate proper use of respiratory medications;To learn and demonstrate proper pursed lip breathing techniques or other breathing techniques.;To learn and understand importance of maintaining oxygen saturations>88%;To learn and understand importance of monitoring  SPO2 with pulse oximeter and demonstrate accurate use of the pulse oximeter.   Long  Term Goals  Verbalizes importance of monitoring SPO2 with pulse oximeter and return demonstration;Maintenance of O2 saturations>88%;Exhibits proper breathing techniques, such as pursed lip breathing or other method taught during program session;Compliance with respiratory medication;Demonstrates proper use of MDI's  Verbalizes importance of monitoring SPO2 with pulse oximeter and return demonstration;Maintenance of O2 saturations>88%;Exhibits proper breathing techniques, such as pursed lip breathing or other method taught during program session;Compliance with respiratory medication;Demonstrates proper use of MDI's  Verbalizes importance of monitoring SPO2 with pulse oximeter and return demonstration;Maintenance of O2 saturations>88%;Exhibits proper breathing techniques, such as pursed lip breathing or other method taught during program session;Compliance with respiratory medication;Demonstrates proper use of MDI's  Verbalizes importance of monitoring SPO2 with pulse oximeter and return demonstration;Maintenance of O2 saturations>88%;Exhibits proper breathing techniques, such as pursed lip breathing or other method taught during program session;Compliance with respiratory medication;Demonstrates proper use of MDI's  Verbalizes importance of monitoring SPO2 with pulse oximeter and return demonstration;Maintenance of O2 saturations>88%;Exhibits proper breathing techniques, such as pursed lip breathing or other method taught during program session;Compliance with respiratory medication;Demonstrates proper use of MDI's   Comments  Reviewed PLB technique with pt.  Talked about how it work and it's important to maintaining his exercise saturations.    Gordon Cameron has been using his PLB and finding it helpful.  Still not normal to him yet, but he continues to work on it.  He is staying compliant with his CPAP.   Gordon Cameron has been using his PLB  and compliant with his CPAP.  His breathing still has room for improvement.  He has not been using his inspirometer, so we talked about using it more regulalry to help strengthen his intercostals.  Gordon Cameron continues to practice PLB and is using his CPAP. He still reports mild SOB, but nothing abnormal for him  Gordon Cameron has been compliant with his CPAP.  He uses his PLB, but his SOB stays about the same.   Goals/Expected Outcomes  Short: Become more profiecient at using PLB.   Long: Become independent at using PLB.  Short: Continue to work on PLB.  Long: Let the PLB become more natural.   Short: Start using his inspirometer daily.  Long: Continue to work on breathing  Short: continue using PLB, increase physical activity. Long: independence with managing SOB  Short: continue using PLB, increase physical activity. Long: independence with managing SOB      Oxygen Discharge (Final Oxygen Re-Evaluation): Oxygen Re-Evaluation - 08/02/18 1554      Program Oxygen Prescription   Program Oxygen Prescription  None      Home Oxygen   Home Oxygen Device  None    Sleep Oxygen Prescription  CPAP    Liters per minute  0    Home Exercise Oxygen Prescription  None    Home at Rest Exercise Oxygen Prescription  None    Compliance with Home Oxygen Use  Yes      Goals/Expected Outcomes   Short Term Goals  To learn and demonstrate proper use of respiratory medications;To learn and demonstrate proper pursed lip breathing techniques or other breathing techniques.;To learn and understand importance of maintaining oxygen saturations>88%;To learn and understand importance of monitoring SPO2 with pulse oximeter and demonstrate accurate use of the pulse oximeter.    Long  Term Goals  Verbalizes importance of monitoring SPO2 with pulse oximeter and return demonstration;Maintenance of O2 saturations>88%;Exhibits proper breathing techniques, such as pursed lip breathing or other method taught during program session;Compliance  with respiratory medication;Demonstrates proper use of MDI's    Comments  Zamere has been compliant with his CPAP.  He uses his PLB, but his SOB stays about the same.    Goals/Expected Outcomes  Short: continue using PLB, increase physical activity. Long: independence with managing SOB       Initial Exercise Prescription:   Perform Capillary Blood Glucose checks as needed.  Exercise Prescription Changes: Exercise Prescription Changes    Row Name 04/14/18 1300 09/10/18 0800           Response to Exercise   Blood Pressure (Admit)  120/60  98/52      Blood Pressure (Exercise)  144/78  120/70      Blood Pressure (Exit)  114/72  118/70      Heart Rate (Admit)  81 bpm  78 bpm      Heart Rate (Exercise)  97 bpm  106 bpm      Heart Rate (Exit)  95 bpm  90 bpm      Oxygen Saturation (Admit)  95 %  97 %      Oxygen Saturation (Exercise)  91 %  96 %  Oxygen Saturation (Exit)  93 %  97 %      Rating of Perceived Exertion (Exercise)  13  12      Perceived Dyspnea (Exercise)  2  2      Symptoms  none  none      Comments  first full day of exercise  -      Duration  Progress to 45 minutes of aerobic exercise without signs/symptoms of physical distress  Progress to 30 minutes of  aerobic without signs/symptoms of physical distress      Intensity  THRR unchanged  THRR unchanged        Progression   Progression  Continue to progress workloads to maintain intensity without signs/symptoms of physical distress.  Continue to progress workloads to maintain intensity without signs/symptoms of physical distress.      Average METs  2.32  2.3        Resistance Training   Training Prescription  Yes  Yes      Weight  3 lbs  3 lbs      Reps  10-15  10-15        Interval Training   Interval Training  No  No        Treadmill   MPH  1.7  1.7      Grade  0  0      Minutes  15  15      METs  2.3  2.3        Recumbant Bike   Level  2  -      Watts  22  -      Minutes  15  -      METs  2.66   -        NuStep   Level  2  -      Minutes  15  -      METs  2  -        T5 Nustep   Level  -  2      SPM  -  80      Minutes  -  15      METs  -  2.2        Home Exercise Plan   Plans to continue exercise at  Home (comment) walking and videos  -      Frequency  Add 3 additional days to program exercise sessions.  -      Initial Home Exercises Provided  04/14/18 mailed packet  -         Exercise Comments: Exercise Comments    Row Name 04/02/18 1145 09/09/18 1717         Exercise Comments  First full day of exercise!  Patient was oriented to gym and equipment including functions, settings, policies, and procedures.  Patient's individual exercise prescription and treatment plan were reviewed.  All starting workloads were established based on the results of the 6 minute walk test done at initial orientation visit.  The plan for exercise progression was also introduced and progression will be customized based on patient's performance and goals.  Counseled pt on increased weight >3lbs, pt stated he was aware, he went to his PCP and did not take his Lasix as ordered, he also stated he ate before coming to class. Pt is asymptomatic and will resume Lasix as ordered by MD.         Exercise Goals and Review:   Exercise Goals  Re-Evaluation : Exercise Goals Re-Evaluation    Gordon Cameron Name 04/02/18 1145 04/14/18 1259 04/30/18 1155 06/16/18 1132 07/13/18 1126     Exercise Goal Re-Evaluation   Exercise Goals Review  Able to understand and use rate of perceived exertion (RPE) scale;Able to understand and use Dyspnea scale;Knowledge and understanding of Target Heart Rate Range (THRR);Understanding of Exercise Prescription  Increase Physical Activity;Increase Strength and Stamina;Understanding of Exercise Prescription  Increase Physical Activity;Increase Strength and Stamina;Understanding of Exercise Prescription  Increase Physical Activity;Increase Strength and Stamina;Understanding of Exercise  Prescription  Increase Physical Activity;Increase Strength and Stamina;Understanding of Exercise Prescription   Comments  Reviewed RPE scale, THR and program prescription with pt today.  Pt voiced understanding and was given a copy of goals to take home.   Gordon Cameron is off to a good start for rehab.  He has completed one full day of exercise.  We have sent him a copy of the home exercise guidelines in the mail.  He has been encouraged to walk more at home.  He has been staying active just not necessairly walking.  We will continue to follow his progress at home.   Spoke with pt's wife.  He is doing well at home.  He has been using our videos some for exercise.  He was encouraged to make sure he is getting 30 min at least three times a week.   Gordon Cameron has been doing well at home. He has not been as good at getting in his exercise over the last couple of weeks.  He says that they have been busy doing things around the house, so he is staying active.   Gordon Cameron reports continuing to do well at home. He has been slowly increasing activity, but weather has gotten in the way. He stays busy around the house with his wife as company   Expected Outcomes  Short: Use RPE daily to regulate intensity. Long: Follow program prescription in THR.  Short: Start to walk at home.  Long: Continue to follow along with videos.   Short: use videos routines for exercise at home.  Long: Continue to maintain activity.   Short: Get back into routine of exericse.  Long: Conitnue to stay active.  Short: add an extra day of intentional exercise. Long: develop exercise routine that is sustainable.   Gordon Cameron Name 08/02/18 1549 09/10/18 0859           Exercise Goal Re-Evaluation   Exercise Goals Review  Increase Physical Activity;Increase Strength and Stamina;Understanding of Exercise Prescription  Increase Physical Activity;Increase Strength and Stamina;Able to understand and use rate of perceived exertion (RPE) scale;Able to understand and use  Dyspnea scale;Knowledge and understanding of Target Heart Rate Range (THRR);Able to check pulse independently;Understanding of Exercise Prescription      Comments  Gordon Cameron continues to do well at home.  He is walking some.  He feels that he has improved since his first session in rehab and feels that he has gotten stronger.  He is feeling good overall.  Gordon Cameron has done well since returning to in person sessions.  He works at at [employer].  Staff will monitor progress      Expected Outcomes  Short:Continue to make time to exercise. Long: Continue to build stamina.  Short - work towards target HR range Long - build stamina         Discharge Exercise Prescription (Final Exercise Prescription Changes): Exercise Prescription Changes - 09/10/18 0800      Response to Exercise   Blood  Pressure (Admit)  98/52    Blood Pressure (Exercise)  120/70    Blood Pressure (Exit)  118/70    Heart Rate (Admit)  78 bpm    Heart Rate (Exercise)  106 bpm    Heart Rate (Exit)  90 bpm    Oxygen Saturation (Admit)  97 %    Oxygen Saturation (Exercise)  96 %    Oxygen Saturation (Exit)  97 %    Rating of Perceived Exertion (Exercise)  12    Perceived Dyspnea (Exercise)  2    Symptoms  none    Duration  Progress to 30 minutes of  aerobic without signs/symptoms of physical distress    Intensity  THRR unchanged      Progression   Progression  Continue to progress workloads to maintain intensity without signs/symptoms of physical distress.    Average METs  2.3      Resistance Training   Training Prescription  Yes    Weight  3 lbs    Reps  10-15      Interval Training   Interval Training  No      Treadmill   MPH  1.7    Grade  0    Minutes  15    METs  2.3      T5 Nustep   Level  2    SPM  80    Minutes  15    METs  2.2       Nutrition:  Target Goals: Understanding of nutrition guidelines, daily intake of sodium <1593m, cholesterol <2052m calories 30% from fat and 7% or less from saturated fats,  daily to have 5 or more servings of fruits and vegetables.  Biometrics:    Nutrition Therapy Plan and Nutrition Goals: Nutrition Therapy & Goals - 05/07/18 1031      Nutrition Therapy   Diet  Low sodium, Heart healthy diet    Protein (specify units)  75-85g    Fiber  30 grams    Whole Grain Foods  3 servings    Saturated Fats  12 max. grams    Fruits and Vegetables  7 servings/day    Sodium  1.5 grams      Personal Nutrition Goals   Nutrition Goal  ST: keep Na beloe 1500-200032m LT: Manage fluid. 6 months out he wants to get past oral surgery, lose wieght.    Comments  Lasik, retaining liquid, hard to determine weight. Low salt, balanced meals, hard because still getting dental work done - bread is hard, cant bite down (only can eat food in small pieces). B: Eggs 1-2x/w, pancakes(white flour) and cereals (instant oatmeal and fruit loops). L: not lunch people, but sometimes chicken and tuna salads. D: lot of chicken (93% lean ground beef sometimes), rice (fried white rice) and a couple of veggies (brocooli). will eat bananas.  Discussed some tips to help with eating such as utilizing sauces to moisten food so it is easier to get down. Discussed MyPlate and tips on how to reduce sodium and add veggies (how to prepare so he can eat them - boil (reserve water) or preferrably steam), using frozen veggies, etc). Discussed <300m61m NA for B and L becuase wife reports its hard for her to control Na at dinnertime. Wife tries to buy low NA foods sticking to below 17% of DV and they don't use salt but will use NoSalt.       Intervention Plan   Intervention  Prescribe, educate and  counsel regarding individualized specific dietary modifications aiming towards targeted core components such as weight, hypertension, lipid management, diabetes, heart failure and other comorbidities.;Nutrition handout(s) given to patient.   Low Na, soft foods ppt and handout to email   Expected Outcomes  Short Term Goal:  Understand basic principles of dietary content, such as calories, fat, sodium, cholesterol and nutrients.;Short Term Goal: A plan has been developed with personal nutrition goals set during dietitian appointment.;Long Term Goal: Adherence to prescribed nutrition plan.       Nutrition Assessments:   Nutrition Goals Re-Evaluation:   Nutrition Goals Discharge (Final Nutrition Goals Re-Evaluation):   Psychosocial: Target Goals: Acknowledge presence or absence of significant depression and/or stress, maximize coping skills, provide positive support system. Participant is able to verbalize types and ability to use techniques and skills needed for reducing stress and depression.   Initial Review & Psychosocial Screening:   Quality of Life Scores:  Scores of 19 and below usually indicate a poorer quality of life in these areas.  A difference of  2-3 points is a clinically meaningful difference.  A difference of 2-3 points in the total score of the Quality of Life Index has been associated with significant improvement in overall quality of life, self-image, physical symptoms, and general health in studies assessing change in quality of life.  PHQ-9: Recent Review Flowsheet Data    Depression screen [facility] 2/9 03/30/2018   Decreased Interest 0   Down, Depressed, Hopeless 0   PHQ - 2 Score 0   Altered sleeping 1   Tired, decreased energy 1   Change in appetite 1   Feeling bad or failure about yourself  0   Trouble concentrating 0   Moving slowly or fidgety/restless 0   Suicidal thoughts 0   PHQ-9 Score 3   Difficult doing work/chores Not difficult at all     Interpretation of Total Score  Total Score Depression Severity:  1-4 = Minimal depression, 5-9 = Mild depression, 10-14 = Moderate depression, 15-19 = Moderately severe depression, 20-27 = Severe depression   Psychosocial Evaluation and Intervention:   Psychosocial Re-Evaluation: Psychosocial Re-Evaluation    [place] Name 04/14/18  1452 06/16/18 1134 07/13/18 1133 08/02/18 1550       Psychosocial Re-Evaluation   Current issues with  Current Stress Concerns  Current Stress Concerns  Current Stress Concerns  Current Stress Concerns    Comments  Gordon Cameron is doing his best to say positive.  He is stressed with the current health scare, but handling it the best that he can.  He is doing the best he can and staying active/  Gordon Cameron has been doing well mentally. They have been staying home and keeping busy with things around the house.  He has gotten away from exercise but will try to get back into the routine.   Gordon Cameron reports "feeling good considering the circumstances" and "doing as good as everyone else." He and his wife stay busy around the house which helps him feel good  Gordon Cameron is doing okay.  He tries to stay busy and is able to do some things.  His health continues to be his biggest stressor.    Expected Outcomes  Short: Continue to be postive.  Long: Continue to stay active to help cope with stress.   Short: Continue to stay active.  Long; Continue to stay positive.   Short: add extra day of exericse to help with mood. Long: stay upbeat and find a "new normal" during all whats going  on with COVID  Short: Exercise regularly for mood boost.  Long: Continue to stay positive.    Interventions  Encouraged to attend Pulmonary Rehabilitation for the exercise  -  Encouraged to attend Pulmonary Rehabilitation for the exercise  -    Continue Psychosocial Services   Follow up required by staff  -  -  -       Psychosocial Discharge (Final Psychosocial Re-Evaluation): Psychosocial Re-Evaluation - 08/02/18 1550      Psychosocial Re-Evaluation   Current issues with  Current Stress Concerns    Comments  Gordon Cameron is doing okay.  He tries to stay busy and is able to do some things.  His health continues to be his biggest stressor.    Expected Outcomes  Short: Exercise regularly for mood boost.  Long: Continue to stay positive.        Education: Education Goals: Education classes will be provided on a weekly basis, covering required topics. Participant will state understanding/return demonstration of topics presented.  Learning Barriers/Preferences:   Education Topics:  Initial Evaluation Education: - Verbal, written and demonstration of respiratory meds, oximetry and breathing techniques. Instruction on use of nebulizers and MDIs and importance of monitoring MDI activations.   Pulmonary Rehab from 03/30/2018 in [facility] Cardiac and Pulmonary Rehab  Date  03/30/18  Educator  [facility]  Instruction Review Code  1- Verbalizes Understanding      General Nutrition Guidelines/Fats and Fiber: -Group instruction provided by verbal, written material, models and posters to present the general guidelines for heart healthy nutrition. Gives an explanation and review of dietary fats and fiber.   Controlling Sodium/Reading Food Labels: -Group verbal and written material supporting the discussion of sodium use in heart healthy nutrition. Review and explanation with models, verbal and written materials for utilization of the food label.   Exercise Physiology & General Exercise Guidelines: - Group verbal and written instruction with models to review the exercise physiology of the cardiovascular system and associated critical values. Provides general exercise guidelines with specific guidelines to those with heart or lung disease.    Aerobic Exercise & Resistance Training: - Gives group verbal and written instruction on the various components of exercise. Focuses on aerobic and resistive training programs and the benefits of this training and how to safely progress through these programs.   Flexibility, Balance, Mind/Body Relaxation: Provides group verbal/written instruction on the benefits of flexibility and balance training, including mind/body exercise modes such as yoga, pilates and tai chi.  Demonstration and skill practice  provided.   Stress and Anxiety: - Provides group verbal and written instruction about the health risks of elevated stress and causes of high stress.  Discuss the correlation between heart/lung disease and anxiety and treatment options. Review healthy ways to manage with stress and anxiety.   Depression: - Provides group verbal and written instruction on the correlation between heart/lung disease and depressed mood, treatment options, and the stigmas associated with seeking treatment.   Exercise & Equipment Safety: - Individual verbal instruction and demonstration of equipment use and safety with use of the equipment.   Pulmonary Rehab from 03/30/2018 in [facility] Cardiac and Pulmonary Rehab  Date  03/30/18  Educator  [facility]  Instruction Review Code  1- Verbalizes Understanding      Infection Prevention: - Provides verbal and written material to individual with discussion of infection control including proper hand washing and proper equipment cleaning during exercise session.   Pulmonary Rehab from 03/30/2018 in [facility] Cardiac and Pulmonary Rehab  Date  03/30/18  Educator  [facility]  Instruction Review Code  1- Verbalizes Understanding      Falls Prevention: - Provides verbal and written material to individual with discussion of falls prevention and safety.   Pulmonary Rehab from 03/30/2018 in [facility] Cardiac and Pulmonary Rehab  Date  03/30/18  Educator  [facility]  Instruction Review Code  1- Verbalizes Understanding      Diabetes: - Individual verbal and written instruction to review signs/symptoms of diabetes, desired ranges of glucose level fasting, after meals and with exercise. Advice that pre and post exercise glucose checks will be done for 3 sessions at entry of program.   Chronic Lung Diseases: - Group verbal and written instruction to review updates, respiratory medications, advancements in procedures and treatments. Discuss use of supplemental oxygen including available portable oxygen  systems, continuous and intermittent flow rates, concentrators, personal use and safety guidelines. Review proper use of inhaler and spacers. Provide informative websites for self-education.    Energy Conservation: - Provide group verbal and written instruction for methods to conserve energy, plan and organize activities. Instruct on pacing techniques, use of adaptive equipment and posture/positioning to relieve shortness of breath.   Triggers and Exacerbations: - Group verbal and written instruction to review types of environmental triggers and ways to prevent exacerbations. Discuss weather changes, air quality and the benefits of nasal washing. Review warning signs and symptoms to help prevent infections. Discuss techniques for effective airway clearance, coughing, and vibrations.   AED/CPR: - Group verbal and written instruction with the use of models to demonstrate the basic use of the AED with the basic ABC's of resuscitation.   Anatomy and Physiology of the Lungs: - Group verbal and written instruction with the use of models to provide basic lung anatomy and physiology related to function, structure and complications of lung disease.   Anatomy & Physiology of the Heart: - Group verbal and written instruction and models provide basic cardiac anatomy and physiology, with the coronary electrical and arterial systems. Review of Valvular disease and Heart Failure   Cardiac Medications: - Group verbal and written instruction to review commonly prescribed medications for heart disease. Reviews the medication, class of the drug, and side effects.   Know Your Numbers and Risk Factors: -Group verbal and written instruction about important numbers in your health.  Discussion of what are risk factors and how they play a role in the disease process.  Review of Cholesterol, Blood Pressure, Diabetes, and BMI and the role they play in your overall health.   Sleep Hygiene: -Provides group verbal  and written instruction about how sleep can affect your health.  Define sleep hygiene, discuss sleep cycles and impact of sleep habits. Review good sleep hygiene tips.    Other: -Provides group and verbal instruction on various topics (see comments)    Knowledge Questionnaire Score:    Core Components/Risk Factors/Patient Goals at Admission:   Core Components/Risk Factors/Patient Goals Review:  Goals and Risk Factor Review    Row Name 04/14/18 1453 06/16/18 1136 07/13/18 1129 08/02/18 1552       Core Components/Risk Factors/Patient Goals Review   Personal Goals Review  Weight Management/Obesity;Hypertension;Improve shortness of breath with ADL's  Weight Management/Obesity;Hypertension;Improve shortness of breath with ADL's  Weight Management/Obesity;Hypertension;Improve shortness of breath with ADL's  Weight Management/Obesity;Hypertension;Improve shortness of breath with ADL's    Review  Gordon Cameron has been doing well. His weight is down some with his Lasix.  His blood pressures have been good as well.  His SOB  is improving and overall he is feeling good.   Gordon Cameron has been doing well at home.  He has been able to lose some weight and keep it down.  His pressures continues to do well.  He is still having some SOB, but doing well overall.   Gordon Cameron reports no issues at home managing his risk factors. His weight loss is going well. His SOB is doing okay, he said the weather hasnt been helping.Gordon Cameron has been struggling with his weight some and attributes it to increased fluid.  He is now on 40 mg of Lasix 3x a day.  It seems to help most days, but some days he still feels heavy.  He checks his pressures at least twice a day and they have been staying pretty good.  He does get a few reading in the 90s but he denies any symptoms with these.    Expected Outcomes  Short: Continue to work on weight loss.  Long: Continue to improve SOB  Short: Continue to work on weight.  Long: Continue to monitor risk  factors.   Short: continue to work on weight loss. Long: independence in management of risk factors.  Short: Continue to work on weight loss.  Long: Continue to monitor risk factors.       Core Components/Risk Factors/Patient Goals at Discharge (Final Review):  Goals and Risk Factor Review - 08/02/18 1552      Core Components/Risk Factors/Patient Goals Review   Personal Goals Review  Weight Management/Obesity;Hypertension;Improve shortness of breath with ADL's    Review  Gordon Cameron has been struggling with his weight some and attributes it to increased fluid.  He is now on 40 mg of Lasix 3x a day.  It seems to help most days, but some days he still feels heavy.  He checks his pressures at least twice a day and they have been staying pretty good.  He does get a few reading in the 90s but he denies any symptoms with these.    Expected Outcomes  Short: Continue to work on weight loss.  Long: Continue to monitor risk factors.       ITP Comments: ITP Comments    Row Name 04/12/18 0931 04/26/18 1024 08/19/18 1150 09/01/18 0553 09/23/18 1025   ITP Comments  Our program is currently closed due to COVID-19.  We are communicating with patient via phone calls and emails.  30 day review completed. ITP sent to Dr. Emily Filbert for review,changes as needed and signature. Continue with ITP unless changes directed by Dr. Sabra Heck.   Gordon Cameron has been doing the same across the board.  He said that he has not noticed any big changes.  He is now scheduled to return on 8/10 at 1045.  30 Day Review Completed today. Continue with ITP unless changed by Medical Director review.  Gordon Cameron is out this week for a dental procedure.   [place] Name 09/28/18 0913 09/29/18 0554         ITP Comments  Unable to complete nutrition 30-day re-eval cycle due to inconsistent attendence  30 Day review. Continue with ITP unless directed changes per Medical Director review.         Comments:

## 2018-09-29 NOTE — Progress Notes (Signed)
Daily Session Note  Patient Details  Name: Gordon Cameron MRN: [id number] Date of Birth: 1934-08-17 Referring Provider:     Pulmonary Rehab from 03/30/2018 in [facility] Cardiac and Pulmonary Rehab  Referring Provider  Ashby Dawes      Encounter Date: 09/29/2018  Check In: Session Check In - 09/29/18 1549      Check-In   Supervising physician immediately available to respond to emergencies  See telemetry face sheet for immediately available ER MD    Location  [facility]-Cardiac & Pulmonary Rehab    Staff Present  Renita Papa, RN Vickki Hearing, BA, ACSM CEP, Exercise Physiologist;Melissa Caiola RDN, LDN    Virtual Visit  No    Medication changes reported      No    Fall or balance concerns reported     No    Warm-up and Cool-down  Performed on first and last piece of equipment    Resistance Training Performed  Yes    VAD Patient?  No    PAD/SET Patient?  No      Pain Assessment   Currently in Pain?  No/denies          Social History   Tobacco Use  Smoking Status Never Smoker  Smokeless Tobacco Never Used    Goals Met:  Independence with exercise equipment Exercise tolerated well No report of cardiac concerns or symptoms Strength training completed today  Goals Unmet:  Not Applicable  Comments: Pt able to follow exercise prescription today without complaint.  Will continue to monitor for progression.    Dr. Emily Filbert is Medical Director for [facility].

## 2018-09-30 ENCOUNTER — Encounter: Payer: Medicare Other | Admitting: *Deleted

## 2018-09-30 DIAGNOSIS — J449 Chronic obstructive pulmonary disease, unspecified: Secondary | ICD-10-CM | POA: Diagnosis not present

## 2018-09-30 NOTE — Progress Notes (Signed)
Daily Session Note  Patient Details  Name: Gordon Cameron MRN: [id number] Date of Birth: 05/15/1934 Referring Provider:     Pulmonary Rehab from 03/30/2018 in [facility] Cardiac and Pulmonary Rehab  Referring Provider  Ashby Dawes      Encounter Date: 09/30/2018  Check In: Session Check In - 09/30/18 1539      Check-In   Supervising physician immediately available to respond to emergencies  See telemetry face sheet for immediately available ER MD    Location  [facility]-Cardiac & Pulmonary Rehab    Staff Present  Renita Papa, RN BSN;Jessica Luan Pulling, MA, RCEP, CCRP, CCET;Joseph [address] RCP,RRT,BSRT    Virtual Visit  No    Medication changes reported      No    Fall or balance concerns reported     No    Warm-up and Cool-down  Performed on first and last piece of equipment    Resistance Training Performed  Yes    VAD Patient?  No    PAD/SET Patient?  No      Pain Assessment   Currently in Pain?  No/denies          Social History   Tobacco Use  Smoking Status Never Smoker  Smokeless Tobacco Never Used    Goals Met:  Independence with exercise equipment Exercise tolerated well No report of cardiac concerns or symptoms Strength training completed today  Goals Unmet:  Not Applicable  Comments: Pt able to follow exercise prescription today without complaint.  Will continue to monitor for progression.    Dr. Emily Filbert is Medical Director for [place] [facility].

## 2018-10-07 ENCOUNTER — Other Ambulatory Visit: Payer: Self-pay

## 2018-10-07 ENCOUNTER — Encounter: Payer: Medicare Other | Admitting: *Deleted

## 2018-10-07 DIAGNOSIS — J449 Chronic obstructive pulmonary disease, unspecified: Secondary | ICD-10-CM

## 2018-10-07 NOTE — Progress Notes (Signed)
Daily Session Note  Patient Details  Name: BLUFORD SEDLER MRN: [id number] Date of Birth: [birth year] Referring Provider:     Pulmonary Rehab from 03/30/2018 in [facility] Cardiac and Pulmonary Rehab  Referring Provider  Ashby Dawes      Encounter Date: 10/07/2018  Check In: Session Check In - 10/07/18 1546      Check-In   Supervising physician immediately available to respond to emergencies  See telemetry face sheet for immediately available ER MD    Location  [facility]-Cardiac & Pulmonary Rehab    Staff Present  Renita Papa, RN BSN;Jessica Luan Pulling, MA, RCEP, CCRP, CCET;Joseph Melvina RCP,RRT,BSRT    Virtual Visit  No    Medication changes reported      No    Fall or balance concerns reported     No    Warm-up and Cool-down  Performed on first and last piece of equipment    Resistance Training Performed  Yes    VAD Patient?  No    PAD/SET Patient?  No      Pain Assessment   Currently in Pain?  No/denies          Social History   Tobacco Use  Smoking Status Never Smoker  Smokeless Tobacco Never Used    Goals Met:  Independence with exercise equipment Exercise tolerated well No report of cardiac concerns or symptoms Strength training completed today  Goals Unmet:  Not Applicable  Comments: Pt able to follow exercise prescription today without complaint.  Will continue to monitor for progression.    Dr. Emily Filbert is Medical Director for [place] and [facility].

## 2018-10-14 ENCOUNTER — Encounter: Payer: Medicare Other | Admitting: *Deleted

## 2018-10-14 ENCOUNTER — Other Ambulatory Visit: Payer: Self-pay

## 2018-10-14 DIAGNOSIS — J449 Chronic obstructive pulmonary disease, unspecified: Secondary | ICD-10-CM

## 2018-10-14 NOTE — Progress Notes (Signed)
Daily Session Note  Patient Details  Name: Gordon Cameron MRN: [id number] Date of Birth: 1934/04/12 Referring Provider:     Pulmonary Rehab from 03/30/2018 in [facility] Cardiac and Pulmonary Rehab  Referring Provider  Ashby Dawes      Encounter Date: 10/14/2018  Check In: Session Check In - 10/14/18 1544      Check-In   Supervising physician immediately available to respond to emergencies  See telemetry face sheet for immediately available ER MD    Location  [facility]-Cardiac & Pulmonary Rehab    Staff Present  Renita Papa, RN BSN;Jeanna Durrell BS, Exercise Physiologist;Idy [place], MA, RCEP, CCRP, CCET;Joseph [place] [employer]    Virtual Visit  No    Medication changes reported      No    Fall or balance concerns reported     No    Warm-up and Cool-down  Performed on first and last piece of equipment    Resistance Training Performed  Yes    VAD Patient?  No    PAD/SET Patient?  No      Pain Assessment   Currently in Pain?  No/denies          Social History   Tobacco Use  Smoking Status Never Smoker  Smokeless Tobacco Never Used    Goals Met:  Proper associated with RPD/PD & O2 Sat Independence with exercise equipment Using PLB without cueing & demonstrates good technique Exercise tolerated well No report of cardiac concerns or symptoms Strength training completed today  Goals Unmet:  Not Applicable  Comments: Pt able to follow exercise prescription today without complaint.  Will continue to monitor for progression.    Dr. Emily Filbert is Medical Director for [place] and [facility].

## 2018-10-20 ENCOUNTER — Encounter: Payer: Medicare Other | Admitting: *Deleted

## 2018-10-20 ENCOUNTER — Other Ambulatory Visit: Payer: Self-pay

## 2018-10-20 DIAGNOSIS — J449 Chronic obstructive pulmonary disease, unspecified: Secondary | ICD-10-CM

## 2018-10-20 NOTE — Progress Notes (Signed)
Daily Session Note  Patient Details  Name: JERED HEINY MRN: [id number] Date of Birth: 1934/09/11 Referring Provider:     Pulmonary Rehab from 03/30/2018 in [facility] Cardiac and Pulmonary Rehab  Referring Provider  Ashby Dawes      Encounter Date: 10/20/2018  Check In: Session Check In - 10/20/18 1539      Check-In   Supervising physician immediately available to respond to emergencies  See telemetry face sheet for immediately available ER MD    Location  [facility]-Cardiac & Pulmonary Rehab    Staff Present  Renita Papa, RN Vickki Hearing, BA, ACSM CEP, Exercise Physiologist;Joseph Tessie Fass RCP,RRT,BSRT    Virtual Visit  No    Medication changes reported      No    Fall or balance concerns reported     No    Warm-up and Cool-down  Performed on first and last piece of equipment    Resistance Training Performed  Yes    VAD Patient?  No    PAD/SET Patient?  No      Pain Assessment   Currently in Pain?  No/denies          Social History   Tobacco Use  Smoking Status Never Smoker  Smokeless Tobacco Never Used    Goals Met:  Proper associated with RPD/PD & O2 Sat Independence with exercise equipment Using PLB without cueing & demonstrates good technique Exercise tolerated well No report of cardiac concerns or symptoms Strength training completed today  Goals Unmet:  Not Applicable  Comments: Pt able to follow exercise prescription today without complaint.  Will continue to monitor for progression.    Dr. Emily Filbert is Medical Director for [place] and [facility].

## 2018-10-21 ENCOUNTER — Encounter: Payer: Medicare Other | Attending: Internal Medicine | Admitting: *Deleted

## 2018-10-21 DIAGNOSIS — J449 Chronic obstructive pulmonary disease, unspecified: Secondary | ICD-10-CM | POA: Diagnosis present

## 2018-10-21 NOTE — Progress Notes (Signed)
Daily Session Note  Patient Details  Name: Gordon Cameron MRN: [id number] Date of Birth: [birth year] Referring Provider:     Pulmonary Rehab from 03/30/2018 in [facility] Cardiac and Pulmonary Rehab  Referring Provider  Ashby Dawes      Encounter Date: 10/21/2018  Check In: Session Check In - 10/21/18 1543      Check-In   Supervising physician immediately available to respond to emergencies  See telemetry face sheet for immediately available ER MD    Location  [facility]-Cardiac & Pulmonary Rehab    Staff Present  Alberteen Sam, MA, RCEP, CCRP, CCET;Meredith Ravena, RN Vickki Hearing, BA, ACSM CEP, Exercise Physiologist;Joseph [place] RCP,RRT,BSRT    Virtual Visit  No    Medication changes reported      No    Fall or balance concerns reported     No    Warm-up and Cool-down  Performed on first and last piece of equipment    Resistance Training Performed  Yes    VAD Patient?  No    PAD/SET Patient?  No      Pain Assessment   Currently in Pain?  No/denies          Social History   Tobacco Use  Smoking Status Never Smoker  Smokeless Tobacco Never Used    Goals Met:  Proper associated with RPD/PD & O2 Sat Independence with exercise equipment Using PLB without cueing & demonstrates good technique Exercise tolerated well No report of cardiac concerns or symptoms Strength training completed today  Goals Unmet:  Not Applicable  Comments: Pt able to follow exercise prescription today without complaint.  Will continue to monitor for progression.    Dr. Emily Filbert is Medical Director for [place] and [facility].

## 2018-10-25 ENCOUNTER — Telehealth: Payer: Self-pay | Admitting: Internal Medicine

## 2018-10-25 NOTE — Telephone Encounter (Signed)
Spoke to pt, who stated that he is having trouble using [place]. Pt stated that inhaler is not activating. Pt will come by tomorrow to have me at a look at inhaler.  Nothing further is needed.

## 2018-10-26 MED ORDER — TUDORZA PRESSAIR 400 MCG/ACT IN AEPB: 1.0000 | INHALATION_SPRAY | Freq: Every day | RESPIRATORY_TRACT | 0 refills | Status: DC

## 2018-10-26 NOTE — Telephone Encounter (Signed)
Pt in office to demonstrate [place]. When pt inhaled, inhaler would not turn red. When inhaler turns red this indicates  that he has received  the medication. Pt was provided with another sample in hopes that current inhaler was broken, and he was still not receiving the medication. It appears that pt is not taking a big enough of a breathe for this inhaler.  DK please advise. Thanks

## 2018-10-26 NOTE — Telephone Encounter (Signed)
Spiriva Respimat 1.25

## 2018-10-26 NOTE — Addendum Note (Signed)
Addended by: Maryanna Shape A on: 10/26/2018 03:00 PM   Modules accepted: Orders

## 2018-10-26 NOTE — Telephone Encounter (Signed)
Called and spoke to pt and relayed below recommendations.  Pt stated that he would like to think about starting spiriva. He will give [place] a call back with update.

## 2018-10-26 NOTE — Addendum Note (Signed)
Addended by: Maryanna Shape A on: 10/26/2018 11:11 AM   Modules accepted: Orders

## 2018-10-27 ENCOUNTER — Encounter: Payer: Self-pay | Admitting: *Deleted

## 2018-10-27 DIAGNOSIS — J449 Chronic obstructive pulmonary disease, unspecified: Secondary | ICD-10-CM

## 2018-10-27 NOTE — Progress Notes (Signed)
Pulmonary Individual Treatment Plan  Patient Details  Name: Auryn W Waterfield MRN: [id number] Date of Birth: 12/10/1934 Referring Provider:     Pulmonary Rehab from 03/30/2018 in [facility]  Referring Provider  Ramachandran      Initial Encounter Date:    Pulmonary Rehab from 03/30/2018 in [facility]  Date  03/30/18      Visit Diagnosis: Chronic obstructive pulmonary disease, unspecified COPD type (HCC)  Patient's Home Medications on Admission:  Current Outpatient Medications:  .  Aclidinium Bromide (TUDORZA PRESSAIR) 400 MCG/ACT AEPB, Inhale 1 puff into the lungs 2 (two) times daily., Disp: 1 each, Rfl: 0 .  Aclidinium Bromide (TUDORZA PRESSAIR) 400 MCG/ACT AEPB, Inhale 1 puff into the lungs daily., Disp: 1 each, Rfl: 0 .  albuterol (VENTOLIN HFA) 108 (90 Base) MCG/ACT inhaler, Inhale 2 puffs into the lungs every 6 (six) hours as needed for wheezing or shortness of breath., Disp: 1 Inhaler, Rfl: 5 .  cetirizine (ZYRTEC) 10 MG tablet, Take 10 mg by mouth daily as needed. , Disp: , Rfl:  .  finasteride (PROSCAR) 5 MG tablet, Once a day, Disp: , Rfl:  .  fluticasone (FLONASE) 50 MCG/ACT nasal spray, Place 2 sprays into both nostrils daily., Disp: , Rfl:  .  furosemide (LASIX) 20 MG tablet, Take 40 mg by mouth daily. , Disp: , Rfl: 2 .  spironolactone (ALDACTONE) 25 MG tablet, Take 25 mg by mouth daily., Disp: , Rfl:   Past Medical History: Past Medical History:  Diagnosis Date  . BPH (benign prostatic hyperplasia)   . Hyperlipidemia   . OSA (obstructive sleep apnea)   . Reactive airway disease   . Restless leg     Tobacco Use: Social History   Tobacco Use  Smoking Status Never Smoker  Smokeless Tobacco Never Used    Labs: Recent Review Flowsheet Data    There is no flowsheet data to display.       Pulmonary Assessment Scores:   UCSD: Self-administered rating of dyspnea associated with activities of daily living  (ADLs) 6-point scale (0 = "not at all" to 5 = "maximal or unable to do because of breathlessness")  Scoring Scores range from 0 to 120.  Minimally important difference is 5 units  CAT: CAT can identify the health impairment of COPD patients and is better correlated with disease progression.  CAT has a scoring range of zero to 40. The CAT score is classified into four groups of low (less than 10), medium (10 - 20), high (21-30) and very high (31-40) based on the impact level of disease on health status. A CAT score over 10 suggests significant symptoms.  A worsening CAT score could be explained by an exacerbation, poor medication adherence, poor inhaler technique, or progression of COPD or comorbid conditions.  CAT MCID is 2 points  mMRC: mMRC (Modified Medical Research Council) Dyspnea Scale is used to assess the degree of baseline functional disability in patients of respiratory disease due to dyspnea. No minimal important difference is established. A decrease in score of 1 point or greater is considered a positive change.   Pulmonary Function Assessment:   Exercise Target Goals: Exercise Program Goal: Individual exercise prescription set using results from initial 6 min walk test and THRR while considering  patient's activity barriers and safety.   Exercise Prescription Goal: Initial exercise prescription builds to 30-45 minutes a day of aerobic activity, 2-3 days per week.  Home exercise guidelines will be given   to patient during program as part of exercise prescription that the participant will acknowledge.  Activity Barriers & Risk Stratification:   6 Minute Walk:  Oxygen Initial Assessment:   Oxygen Re-Evaluation: Oxygen Re-Evaluation    Row Name 06/16/18 1137 07/13/18 1135 08/02/18 1554 09/30/18 1551       Program Oxygen Prescription   Program Oxygen Prescription  None  None  None  None      Home Oxygen   Home Oxygen Device  None  None  None  None    Sleep Oxygen  Prescription  CPAP  CPAP  CPAP  CPAP    Liters per minute  0  0  0  0    Home Exercise Oxygen Prescription  None  None  None  None    Home at Rest Exercise Oxygen Prescription  None  None  None  None    Compliance with Home Oxygen Use  Yes  Yes  Yes  Yes      Goals/Expected Outcomes   Short Term Goals  To learn and demonstrate proper use of respiratory medications;To learn and demonstrate proper pursed lip breathing techniques or other breathing techniques.;To learn and understand importance of maintaining oxygen saturations>88%;To learn and understand importance of monitoring SPO2 with pulse oximeter and demonstrate accurate use of the pulse oximeter.  To learn and demonstrate proper use of respiratory medications;To learn and demonstrate proper pursed lip breathing techniques or other breathing techniques.;To learn and understand importance of maintaining oxygen saturations>88%;To learn and understand importance of monitoring SPO2 with pulse oximeter and demonstrate accurate use of the pulse oximeter.  To learn and demonstrate proper use of respiratory medications;To learn and demonstrate proper pursed lip breathing techniques or other breathing techniques.;To learn and understand importance of maintaining oxygen saturations>88%;To learn and understand importance of monitoring SPO2 with pulse oximeter and demonstrate accurate use of the pulse oximeter.  To learn and exhibit compliance with exercise, home and travel O2 prescription;To learn and understand importance of monitoring SPO2 with pulse oximeter and demonstrate accurate use of the pulse oximeter.;To learn and understand importance of maintaining oxygen saturations>88%;To learn and demonstrate proper pursed lip breathing techniques or other breathing techniques.;To learn and demonstrate proper use of respiratory medications    Long  Term Goals  Verbalizes importance of monitoring SPO2 with pulse oximeter and return demonstration;Maintenance of  O2 saturations>88%;Exhibits proper breathing techniques, such as pursed lip breathing or other method taught during program session;Compliance with respiratory medication;Demonstrates proper use of MDI's  Verbalizes importance of monitoring SPO2 with pulse oximeter and return demonstration;Maintenance of O2 saturations>88%;Exhibits proper breathing techniques, such as pursed lip breathing or other method taught during program session;Compliance with respiratory medication;Demonstrates proper use of MDI's  Verbalizes importance of monitoring SPO2 with pulse oximeter and return demonstration;Maintenance of O2 saturations>88%;Exhibits proper breathing techniques, such as pursed lip breathing or other method taught during program session;Compliance with respiratory medication;Demonstrates proper use of MDI's  Exhibits compliance with exercise, home and travel O2 prescription;Verbalizes importance of monitoring SPO2 with pulse oximeter and return demonstration;Maintenance of O2 saturations>88%;Exhibits proper breathing techniques, such as pursed lip breathing or other method taught during program session;Demonstrates proper use of MDI's;Compliance with respiratory medication    Comments  Temple has been using his PLB and compliant with his CPAP.  His breathing still has room for improvement.  He has not been using his inspirometer, so we talked about using it more regulalry to help strengthen his intercostals.  Kadarius continues to practice PLB and is using  his CPAP. He still reports mild SOB, but nothing abnormal for him  Erskin has been compliant with his CPAP.  He uses his PLB, but his SOB stays about the same.  Patient is wearing his CPAP every night. He takes his albuterol when he needs it. He has yet to get a pulse oximeter. Informed patient where and how to obtain a pulse oximeter.    Goals/Expected Outcomes  Short: Start using his inspirometer daily.  Long: Continue to work on breathing  Short: continue using  PLB, increase physical activity. Long: independence with managing SOB  Short: continue using PLB, increase physical activity. Long: independence with managing SOB  Short:obtain a pulse oximeter. Long: monitor oxygen levels on a daily basis.       Oxygen Discharge (Final Oxygen Re-Evaluation): Oxygen Re-Evaluation - 09/30/18 1551      Program Oxygen Prescription   Program Oxygen Prescription  None      Home Oxygen   Home Oxygen Device  None    Sleep Oxygen Prescription  CPAP    Liters per minute  0    Home Exercise Oxygen Prescription  None    Home at Rest Exercise Oxygen Prescription  None    Compliance with Home Oxygen Use  Yes      Goals/Expected Outcomes   Short Term Goals  To learn and exhibit compliance with exercise, home and travel O2 prescription;To learn and understand importance of monitoring SPO2 with pulse oximeter and demonstrate accurate use of the pulse oximeter.;To learn and understand importance of maintaining oxygen saturations>88%;To learn and demonstrate proper pursed lip breathing techniques or other breathing techniques.;To learn and demonstrate proper use of respiratory medications    Long  Term Goals  Exhibits compliance with exercise, home and travel O2 prescription;Verbalizes importance of monitoring SPO2 with pulse oximeter and return demonstration;Maintenance of O2 saturations>88%;Exhibits proper breathing techniques, such as pursed lip breathing or other method taught during program session;Demonstrates proper use of MDI's;Compliance with respiratory medication    Comments  Patient is wearing his CPAP every night. He takes his albuterol when he needs it. He has yet to get a pulse oximeter. Informed patient where and how to obtain a pulse oximeter.    Goals/Expected Outcomes  Short:obtain a pulse oximeter. Long: monitor oxygen levels on a daily basis.       Initial Exercise Prescription:   Perform Capillary Blood Glucose checks as needed.  Exercise  Prescription Changes: Exercise Prescription Changes    Row Name 09/10/18 0800 10/12/18 1500           Response to Exercise   Blood Pressure (Admit)  98/52  104/64      Blood Pressure (Exercise)  120/70  124/64      Blood Pressure (Exit)  118/70  110/60      Heart Rate (Admit)  78 bpm  75 bpm      Heart Rate (Exercise)  106 bpm  94 bpm      Heart Rate (Exit)  90 bpm  84 bpm      Oxygen Saturation (Admit)  97 %  97 %      Oxygen Saturation (Exercise)  96 %  96 %      Oxygen Saturation (Exit)  97 %  96 %      Rating of Perceived Exertion (Exercise)  12  12      Perceived Dyspnea (Exercise)  2  2      Symptoms  none  none        Duration  Progress to 30 minutes of  aerobic without signs/symptoms of physical distress  Continue with 30 min of aerobic exercise without signs/symptoms of physical distress.      Intensity  THRR unchanged  THRR unchanged        Progression   Progression  Continue to progress workloads to maintain intensity without signs/symptoms of physical distress.  Continue to progress workloads to maintain intensity without signs/symptoms of physical distress.      Average METs  2.3  2.17        Resistance Training   Training Prescription  Yes  Yes      Weight  3 lbs  3 lbs      Reps  10-15  10-15        Interval Training   Interval Training  No  No        Treadmill   MPH  1.7  1.7      Grade  0  0      Minutes  15  15      METs  2.3  2.3        Recumbant Bike   Level  -  2      Minutes  -  15        T5 Nustep   Level  2  2      SPM  80  -      Minutes  15  15      METs  2.2  2.2        Biostep-RELP   Level  -  1      Minutes  -  15      METs  -  2        Home Exercise Plan   Plans to continue exercise at  -  Home (comment) walking      Frequency  -  Add 3 additional days to program exercise sessions.      Initial Home Exercises Provided  -  04/14/18         Exercise Comments: Exercise Comments    Row Name 09/09/18 1717           Exercise  Comments  Counseled pt on increased weight >3lbs, pt stated he was aware, he went to his PCP and did not take his Lasix as ordered, he also stated he ate before coming to class. Pt is asymptomatic and will resume Lasix as ordered by MD.          Exercise Goals and Review:   Exercise Goals Re-Evaluation : Exercise Goals Re-Evaluation    Row Name 06/16/18 1132 07/13/18 1126 08/02/18 1549 09/10/18 0859 09/30/18 1546     Exercise Goal Re-Evaluation   Exercise Goals Review  Increase Physical Activity;Increase Strength and Stamina;Understanding of Exercise Prescription  Increase Physical Activity;Increase Strength and Stamina;Understanding of Exercise Prescription  Increase Physical Activity;Increase Strength and Stamina;Understanding of Exercise Prescription  Increase Physical Activity;Increase Strength and Stamina;Able to understand and use rate of perceived exertion (RPE) scale;Able to understand and use Dyspnea scale;Knowledge and understanding of Target Heart Rate Range (THRR);Able to check pulse independently;Understanding of Exercise Prescription  Increase Physical Activity;Increase Strength and Stamina;Able to understand and use Dyspnea scale;Understanding of Exercise Prescription   Comments  Zander has been doing well at home. He has not been as good at getting in his exercise over the last couple of weeks.  He says that they have been busy doing things around the house, so   he is staying active.   Brookes reports continuing to do well at home. He has been slowly increasing activity, but weather has gotten in the way. He stays busy around the house with his wife as company  Samel continues to do well at home.  He is walking some.  He feels that he has improved since his first session in rehab and feels that he has gotten stronger.  He is feeling good overall.  Ebrahim has done well since returning to in person sessions.  He works at at [employer].  Staff will monitor progress  Mivaan wants to be able to  walk faster and go longer distances. He wants to go up in weights on his strength training. He is only able to eat soft foods and is not eating the best since he needs oral surgery.   Expected Outcomes  Short: Get back into routine of exericse.  Long: Conitnue to stay active.  Short: add an extra day of intentional exercise. Long: develop exercise routine that is sustainable.  Short:Continue to make time to exercise. Long: Continue to build stamina.  Short - work towards target HR range Long - build stamina  Short: get oral surgery to maintain nutrtion to improve strength and staminia. Long: maintain exercise and increase work loads.   [place] Name 10/12/18 1552             Exercise Goal Re-Evaluation   Exercise Goals Review  Increase Physical Activity;Increase Strength and Stamina;Understanding of Exercise Prescription       Comments  Cornelis is doing well in rehab.  His attendance has been lacking recently due to his oral surgery needs.  He wants to keep coming.  He is up to 2 METs on the BioStep.  We will continue to monitor his progress.       Expected Outcomes  Short: Attend rehab regularly again.  Long: Continue to exericse on his own at home on off days.          Discharge Exercise Prescription (Final Exercise Prescription Changes): Exercise Prescription Changes - 10/12/18 1500      Response to Exercise   Blood Pressure (Admit)  104/64    Blood Pressure (Exercise)  124/64    Blood Pressure (Exit)  110/60    Heart Rate (Admit)  75 bpm    Heart Rate (Exercise)  94 bpm    Heart Rate (Exit)  84 bpm    Oxygen Saturation (Admit)  97 %    Oxygen Saturation (Exercise)  96 %    Oxygen Saturation (Exit)  96 %    Rating of Perceived Exertion (Exercise)  12    Perceived Dyspnea (Exercise)  2    Symptoms  none    Duration  Continue with 30 min of aerobic exercise without signs/symptoms of physical distress.    Intensity  THRR unchanged      Progression   Progression  Continue to progress  workloads to maintain intensity without signs/symptoms of physical distress.    Average METs  2.17      Resistance Training   Training Prescription  Yes    Weight  3 lbs    Reps  10-15      Interval Training   Interval Training  No      Treadmill   MPH  1.7    Grade  0    Minutes  15    METs  2.3      Recumbant Bike   Level  2    Minutes  15      T5 Nustep   Level  2    Minutes  15    METs  2.2      Biostep-RELP   Level  1    Minutes  15    METs  2      Home Exercise Plan   Plans to continue exercise at  Home (comment)   walking   Frequency  Add 3 additional days to program exercise sessions.    Initial Home Exercises Provided  04/14/18       Nutrition:  Target Goals: Understanding of nutrition guidelines, daily intake of sodium <1500mg, cholesterol <200mg, calories 30% from fat and 7% or less from saturated fats, daily to have 5 or more servings of fruits and vegetables.  Biometrics:    Nutrition Therapy Plan and Nutrition Goals: Nutrition Therapy & Goals - 05/07/18 1031      Nutrition Therapy   Diet  Low sodium, Heart healthy diet    Protein (specify units)  75-85g    Fiber  30 grams    Whole Grain Foods  3 servings    Saturated Fats  12 max. grams    Fruits and Vegetables  7 servings/day    Sodium  1.5 grams      Personal Nutrition Goals   Nutrition Goal  ST: keep Na beloe 1500-2000mg/d LT: Manage fluid. 6 months out he wants to get past oral surgery, lose wieght.    Comments  Lasik, retaining liquid, hard to determine weight. Low salt, balanced meals, hard because still getting dental work done - bread is hard, cant bite down (only can eat food in small pieces). B: Eggs 1-2x/w, pancakes(white flour) and cereals (instant oatmeal and fruit loops). L: not lunch people, but sometimes chicken and tuna salads. D: lot of chicken (93% lean ground beef sometimes), rice (fried white rice) and a couple of veggies (brocooli). will eat bananas.  Discussed some tips  to help with eating such as utilizing sauces to moisten food so it is easier to get down. Discussed MyPlate and tips on how to reduce sodium and add veggies (how to prepare so he can eat them - boil (reserve water) or preferrably steam), using frozen veggies, etc). Discussed <300mg of NA for B and L becuase wife reports its hard for her to control Na at dinnertime. Wife tries to buy low NA foods sticking to below 17% of DV and they don't use salt but will use NoSalt.       Intervention Plan   Intervention  Prescribe, educate and counsel regarding individualized specific dietary modifications aiming towards targeted core components such as weight, hypertension, lipid management, diabetes, heart failure and other comorbidities.;Nutrition handout(s) given to patient.   Low Na, soft foods ppt and handout to email   Expected Outcomes  Short Term Goal: Understand basic principles of dietary content, such as calories, fat, sodium, cholesterol and nutrients.;Short Term Goal: A plan has been developed with personal nutrition goals set during dietitian appointment.;Long Term Goal: Adherence to prescribed nutrition plan.       Nutrition Assessments:   Nutrition Goals Re-Evaluation:   Nutrition Goals Discharge (Final Nutrition Goals Re-Evaluation):   Psychosocial: Target Goals: Acknowledge presence or absence of significant depression and/or stress, maximize coping skills, provide positive support system. Participant is able to verbalize types and ability to use techniques and skills needed for reducing stress and depression.   Initial Review & Psychosocial Screening:     Quality of Life Scores:  Scores of 19 and below usually indicate a poorer quality of life in these areas.  A difference of  2-3 points is a clinically meaningful difference.  A difference of 2-3 points in the total score of the Quality of Life Index has been associated with significant improvement in overall quality of life, self-image,  physical symptoms, and general health in studies assessing change in quality of life.  PHQ-9: Recent Review Flowsheet Data    Depression screen [facility] 2/9 03/30/2018   Decreased Interest 0   Down, Depressed, Hopeless 0   PHQ - 2 Score 0   Altered sleeping 1   Tired, decreased energy 1   Change in appetite 1   Feeling bad or failure about yourself  0   Trouble concentrating 0   Moving slowly or fidgety/restless 0   Suicidal thoughts 0   PHQ-9 Score 3   Difficult doing work/chores Not difficult at all     Interpretation of Total Score  Total Score Depression Severity:  1-4 = Minimal depression, 5-9 = Mild depression, 10-14 = Moderate depression, 15-19 = Moderately severe depression, 20-27 = Severe depression   Psychosocial Evaluation and Intervention:   Psychosocial Re-Evaluation: Psychosocial Re-Evaluation    [place] Name 06/16/18 1134 07/13/18 1133 08/02/18 1550 09/30/18 1556       Psychosocial Re-Evaluation   Current issues with  Current Stress Concerns  Current Stress Concerns  Current Stress Concerns  Current Stress Concerns    Comments  Joron has been doing well mentally. They have been staying home and keeping busy with things around the house.  He has gotten away from exercise but will try to get back into the routine.   Alondra reports "feeling good considering the circumstances" and "doing as good as everyone else." He and his wife stay busy around the house which helps him feel good  Damoney is doing okay.  He tries to stay busy and is able to do some things.  His health continues to be his biggest stressor.  Patient is stressed about his oral surgery that is coming up. He is not able to eat what he wants and has to eat things that he is able to gum. His surgery is next Wednesday on his mouth.    Expected Outcomes  Short: Continue to stay active.  Long; Continue to stay positive.   Short: add extra day of exericse to help with mood. Long: stay upbeat and find a "new normal" during  all whats going on with COVID  Short: Exercise regularly for mood boost.  Long: Continue to stay positive.  Short: get his oral surgery. long: maintain exercise prescription to minimize stress.    Interventions  -  Encouraged to attend Pulmonary Rehabilitation for the exercise  -  Encouraged to attend Pulmonary Rehabilitation for the exercise    Continue Psychosocial Services   -  -  -  Follow up required by staff       Psychosocial Discharge (Final Psychosocial Re-Evaluation): Psychosocial Re-Evaluation - 09/30/18 1556      Psychosocial Re-Evaluation   Current issues with  Current Stress Concerns    Comments  Patient is stressed about his oral surgery that is coming up. He is not able to eat what he wants and has to eat things that he is able to gum. His surgery is next Wednesday on his mouth.    Expected Outcomes  Short: get his oral surgery. long: maintain exercise prescription to minimize stress.  Interventions  Encouraged to attend Pulmonary Rehabilitation for the exercise    Continue Psychosocial Services   Follow up required by staff       Education: Education Goals: Education classes will be provided on a weekly basis, covering required topics. Participant will state understanding/return demonstration of topics presented.  Learning Barriers/Preferences:   Education Topics:  Initial Evaluation Education: - Verbal, written and demonstration of respiratory meds, oximetry and breathing techniques. Instruction on use of nebulizers and MDIs and importance of monitoring MDI activations.   Pulmonary Rehab from 03/30/2018 in [facility]  Date  03/30/18  Educator  JH  Instruction Review Code  1- Verbalizes Understanding      General Nutrition Guidelines/Fats and Fiber: -Group instruction provided by verbal, written material, models and posters to present the general guidelines for heart healthy nutrition. Gives an explanation and review of dietary fats and  fiber.   Controlling Sodium/Reading Food Labels: -Group verbal and written material supporting the discussion of sodium use in heart healthy nutrition. Review and explanation with models, verbal and written materials for utilization of the food label.   Exercise Physiology & General Exercise Guidelines: - Group verbal and written instruction with models to review the exercise physiology of the cardiovascular system and associated critical values. Provides general exercise guidelines with specific guidelines to those with heart or lung disease.    Aerobic Exercise & Resistance Training: - Gives group verbal and written instruction on the various components of exercise. Focuses on aerobic and resistive training programs and the benefits of this training and how to safely progress through these programs.   Flexibility, Balance, Mind/Body Relaxation: Provides group verbal/written instruction on the benefits of flexibility and balance training, including mind/body exercise modes such as yoga, pilates and tai chi.  Demonstration and skill practice provided.   Stress and Anxiety: - Provides group verbal and written instruction about the health risks of elevated stress and causes of high stress.  Discuss the correlation between heart/lung disease and anxiety and treatment options. Review healthy ways to manage with stress and anxiety.   Depression: - Provides group verbal and written instruction on the correlation between heart/lung disease and depressed mood, treatment options, and the stigmas associated with seeking treatment.   Exercise & Equipment Safety: - Individual verbal instruction and demonstration of equipment use and safety with use of the equipment.   Pulmonary Rehab from 03/30/2018 in [facility] Cardiac and Pulmonary Rehab  Date  03/30/18  Educator  JH  Instruction Review Code  1- Verbalizes Understanding      Infection Prevention: - Provides verbal and written material to  individual with discussion of infection control including proper hand washing and proper equipment cleaning during exercise session.   Pulmonary Rehab from 03/30/2018 in [facility]  Date  03/30/18  Educator  JH  Instruction Review Code  1- Verbalizes Understanding      Falls Prevention: - Provides verbal and written material to individual with discussion of falls prevention and safety.   Pulmonary Rehab from 03/30/2018 in [facility]  Date  03/30/18  Educator  JH  Instruction Review Code  1- Verbalizes Understanding      Diabetes: - Individual verbal and written instruction to review signs/symptoms of diabetes, desired ranges of glucose level fasting, after meals and with exercise. Advice that pre and post exercise glucose checks will be done for 3 sessions at entry of program.   Chronic Lung Diseases: - Group verbal and   written instruction to review updates, respiratory medications, advancements in procedures and treatments. Discuss use of supplemental oxygen including available portable oxygen systems, continuous and intermittent flow rates, concentrators, personal use and safety guidelines. Review proper use of inhaler and spacers. Provide informative websites for self-education.    Energy Conservation: - Provide group verbal and written instruction for methods to conserve energy, plan and organize activities. Instruct on pacing techniques, use of adaptive equipment and posture/positioning to relieve shortness of breath.   Triggers and Exacerbations: - Group verbal and written instruction to review types of environmental triggers and ways to prevent exacerbations. Discuss weather changes, air quality and the benefits of nasal washing. Review warning signs and symptoms to help prevent infections. Discuss techniques for effective airway clearance, coughing, and vibrations.   AED/CPR: - Group verbal and written instruction with the use of  models to demonstrate the basic use of the AED with the basic ABC's of resuscitation.   Anatomy and Physiology of the Lungs: - Group verbal and written instruction with the use of models to provide basic lung anatomy and physiology related to function, structure and complications of lung disease.   Anatomy & Physiology of the Heart: - Group verbal and written instruction and models provide basic cardiac anatomy and physiology, with the coronary electrical and arterial systems. Review of Valvular disease and Heart Failure   Cardiac Medications: - Group verbal and written instruction to review commonly prescribed medications for heart disease. Reviews the medication, class of the drug, and side effects.   Know Your Numbers and Risk Factors: -Group verbal and written instruction about important numbers in your health.  Discussion of what are risk factors and how they play a role in the disease process.  Review of Cholesterol, Blood Pressure, Diabetes, and BMI and the role they play in your overall health.   Sleep Hygiene: -Provides group verbal and written instruction about how sleep can affect your health.  Define sleep hygiene, discuss sleep cycles and impact of sleep habits. Review good sleep hygiene tips.    Other: -Provides group and verbal instruction on various topics (see comments)    Knowledge Questionnaire Score:    Core Components/Risk Factors/Patient Goals at Admission:   Core Components/Risk Factors/Patient Goals Review:  Goals and Risk Factor Review    Row Name 06/16/18 1136 07/13/18 1129 08/02/18 1552 09/30/18 1559       Core Components/Risk Factors/Patient Goals Review   Personal Goals Review  Weight Management/Obesity;Hypertension;Improve shortness of breath with ADL's  Weight Management/Obesity;Hypertension;Improve shortness of breath with ADL's  Weight Management/Obesity;Hypertension;Improve shortness of breath with ADL's  Weight Management/Obesity;Improve  shortness of breath with ADL's    Review  Juandedios has been doing well at home.  He has been able to lose some weight and keep it down.  His pressures continues to do well.  He is still having some SOB, but doing well overall.   Kazden reports no issues at home managing his risk factors. His weight loss is going well. His SOB is doing okay, he said the weather hasnt been helping.Chalmers Cater has been struggling with his weight some and attributes it to increased fluid.  He is now on 40 mg of Lasix 3x a day.  It seems to help most days, but some days he still feels heavy.  He checks his pressures at least twice a day and they have been staying pretty good.  He does get a few reading in the 90s but he denies any symptoms with these.  He is taking lasix for the fluid around his heart. His primary care is monitoring his lasix intake. Hulon has lost weight since mark and has reached his goal of weighing 190 pounds. He weighed in today at 188.5 pounds.    Expected Outcomes  Short: Continue to work on weight.  Long: Continue to monitor risk factors.   Short: continue to work on weight loss. Long: independence in management of risk factors.  Short: Continue to work on weight loss.  Long: Continue to monitor risk factors.  Short: improve on ADL. Long: be able to maintain ADL's independently.       Core Components/Risk Factors/Patient Goals at Discharge (Final Review):  Goals and Risk Factor Review - 09/30/18 1559      Core Components/Risk Factors/Patient Goals Review   Personal Goals Review  Weight Management/Obesity;Improve shortness of breath with ADL's    Review  He is taking lasix for the fluid around his heart. His primary care is monitoring his lasix intake. Clearance has lost weight since mark and has reached his goal of weighing 190 pounds. He weighed in today at 188.5 pounds.    Expected Outcomes  Short: improve on ADL. Long: be able to maintain ADL's independently.       ITP Comments: ITP Comments    Row  Name 08/19/18 1150 09/01/18 0553 09/23/18 1025 09/28/18 0913 09/29/18 0554   ITP Comments  Kyshawn has been doing the same across the board.  He said that he has not noticed any big changes.  He is now scheduled to return on 8/10 at 1045.  30 Day Review Completed today. Continue with ITP unless changed by Medical Director review.  Yazeed is out this week for a dental procedure.  Unable to complete nutrition 30-day re-eval cycle due to inconsistent attendence  30 Day review. Continue with ITP unless directed changes per Medical Director review.   Collins Name 10/27/18 1256           ITP Comments  30 day review completed. ITP sent to Dr. Emily Filbert, Medical Director of Cardiac and Pulmonary Rehab. Continue with ITP unless changes are made by physician.  Department closed starting 10/2 until further notice by infection prevention and Health at Work teams for [place].          Comments: 30 day review

## 2018-10-28 NOTE — Telephone Encounter (Signed)
Left message for pt

## 2018-10-28 NOTE — Telephone Encounter (Signed)
Called and spoke to pt, who stated that he is still deciding on spiriva. He would like to determine price prior to switching.   Pt is able to get complete dose of Gordon Cameron now and feels that it is working well, however co pay for this medication is expensive.  Pt stated that he is planning to switch to [facility], however Spiriva is not a preferred medication. Preferred medications for [facility] are atrovent and incruse.   Pt will call back with back with update.

## 2018-11-03 ENCOUNTER — Other Ambulatory Visit: Payer: Self-pay

## 2018-11-03 ENCOUNTER — Encounter: Payer: Medicare Other | Admitting: *Deleted

## 2018-11-03 DIAGNOSIS — J449 Chronic obstructive pulmonary disease, unspecified: Secondary | ICD-10-CM | POA: Diagnosis not present

## 2018-11-03 NOTE — Progress Notes (Signed)
Daily Session Note  Patient Details  Name: PRATT BRESS MRN: [id number] Date of Birth: 1934-03-11 Referring Provider:     Pulmonary Rehab from 03/30/2018 in [facility] Cardiac and Pulmonary Rehab  Referring Provider  Ashby Dawes      Encounter Date: 11/03/2018  Check In: Session Check In - 11/03/18 1551      Check-In   Supervising physician immediately available to respond to emergencies  See telemetry face sheet for immediately available ER MD    Location  [facility]-Cardiac & Pulmonary Rehab    Staff Present  Renita Papa, RN BSN;Melissa Caiola RDN, LDN;Joseph Tessie Fass RCP,RRT,BSRT    Virtual Visit  No    Medication changes reported      No    Warm-up and Cool-down  Performed on first and last piece of equipment    Resistance Training Performed  Yes    VAD Patient?  No    PAD/SET Patient?  No      Pain Assessment   Currently in Pain?  No/denies          Social History   Tobacco Use  Smoking Status Never Smoker  Smokeless Tobacco Never Used    Goals Met:  Independence with exercise equipment Exercise tolerated well No report of cardiac concerns or symptoms Strength training completed today  Goals Unmet:  Not Applicable  Comments: Pt able to follow exercise prescription today without complaint.  Will continue to monitor for progression.    Dr. Emily Filbert is Medical Director for [place] and [facility].

## 2018-11-04 ENCOUNTER — Encounter: Payer: Medicare Other | Admitting: *Deleted

## 2018-11-04 DIAGNOSIS — J449 Chronic obstructive pulmonary disease, unspecified: Secondary | ICD-10-CM

## 2018-11-04 NOTE — Progress Notes (Signed)
Daily Session Note  Patient Details  Name: Gordon Cameron MRN: [id number] Date of Birth: 02-Jun-1934 Referring Provider:     Pulmonary Rehab from 03/30/2018 in [facility] Cardiac and Pulmonary Rehab  Referring Provider  Ashby Dawes      Encounter Date: 11/04/2018  Check In: Session Check In - 11/04/18 1555      Check-In   Supervising physician immediately available to respond to emergencies  See telemetry face sheet for immediately available ER MD    Location  [facility] Pul[facility] Rehab    Staff Present  Justin Mend RCP,RRT,BSRT;Harrie Cazarez Luan Pulling, MA, RCEP, CCRP, CCET;Melissa Caiola RDN, LDN;Carroll Enterkin, RN, BSN-BC, CCRP    Virtual Visit  No    Medication changes reported      No    Fall or balance concerns reported     No    Warm-up and Cool-down  Performed on first and last piece of equipment    Resistance Training Performed  Yes    VAD Patient?  No    PAD/SET Patient?  No      Pain Assessment   Currently in Pain?  No/denies          Social History   Tobacco Use  Smoking Status Never Smoker  Smokeless Tobacco Never Used    Goals Met:  Independence with exercise equipment Exercise tolerated well Personal goals reviewed No report of cardiac concerns or symptoms Strength training completed today  Goals Unmet:  Not Applicable  Comments: Pt able to follow exercise prescription today without complaint.  Will continue to monitor for progression.    Dr. Emily Filbert is Medical Director for [place] and [facility].

## 2018-11-08 ENCOUNTER — Encounter: Payer: Medicare Other | Admitting: *Deleted

## 2018-11-08 ENCOUNTER — Other Ambulatory Visit: Payer: Self-pay

## 2018-11-08 DIAGNOSIS — J449 Chronic obstructive pulmonary disease, unspecified: Secondary | ICD-10-CM

## 2018-11-08 NOTE — Progress Notes (Signed)
Daily Session Note  Patient Details  Name: Gordon Cameron MRN: [id number] Date of Birth: 1934-02-29 Referring Provider:     Pulmonary Rehab from 03/30/2018 in [facility] Cardiac and Pulmonary Rehab  Referring Provider  Ashby Dawes      Encounter Date: 11/08/2018  Check In: Session Check In - 11/08/18 1545      Check-In   Supervising physician immediately available to respond to emergencies  See telemetry face sheet for immediately available ER MD    Location  [facility] Pulmonary Re[facility]    Staff Present  Renita Papa, RN BSN;Jessica Luan Pulling, MA, RCEP, CCRP, CCET;Melissa [place] RDN, Wilhelmina Mcardle, BS, ACSM CEP, Exercise Physiologist    Virtual Visit  No    Medication changes reported      No    Fall or balance concerns reported     No    Warm-up and Cool-down  Performed on first and last piece of equipment    Resistance Training Performed  Yes    VAD Patient?  No    PAD/SET Patient?  No      Pain Assessment   Currently in Pain?  No/denies          Social History   Tobacco Use  Smoking Status Never Smoker  Smokeless Tobacco Never Used    Goals Met:  Independence with exercise equipment Exercise tolerated well No report of cardiac concerns or symptoms Strength training completed today  Goals Unmet:  Not Applicable  Comments: Pt able to follow exercise prescription today without complaint.  Will continue to monitor for progression.    Dr. Emily Filbert is Medical Director for [place] and [facility].

## 2018-11-09 NOTE — Telephone Encounter (Signed)
Left message for update

## 2018-11-10 ENCOUNTER — Encounter: Payer: Medicare Other | Admitting: *Deleted

## 2018-11-10 ENCOUNTER — Other Ambulatory Visit: Payer: Self-pay

## 2018-11-10 DIAGNOSIS — J449 Chronic obstructive pulmonary disease, unspecified: Secondary | ICD-10-CM | POA: Diagnosis not present

## 2018-11-10 NOTE — Progress Notes (Signed)
Daily Session Note  Patient Details  Name: BAYANI RENTERIA MRN: [id number] Date of Birth: [birth year] Referring Provider:     Pulmonary Rehab from 03/30/2018 in [facility] Cardiac and Pulmonary Rehab  Referring Provider  Ashby Dawes      Encounter Date: 11/10/2018  Check In: Session Check In - 11/10/18 1551      Check-In   Supervising physician immediately available to respond to emergencies  See telemetry face sheet for immediately available ER MD    Location  [facility] Re[facility]    Staff Present  Renita Papa, RN Vickki Hearing, BA, ACSM CEP, Exercise Physiologist;Melissa Caiola RDN, LDN    Virtual Visit  No    Medication changes reported      No    Fall or balance concerns reported     No    Warm-up and Cool-down  Performed on first and last piece of equipment    Resistance Training Performed  Yes    VAD Patient?  No    PAD/SET Patient?  No      Pain Assessment   Currently in Pain?  No/denies          Social History   Tobacco Use  Smoking Status Never Smoker  Smokeless Tobacco Never Used    Goals Met:  Independence with exercise equipment Exercise tolerated well No report of cardiac concerns or symptoms Strength training completed today  Goals Unmet:  Not Applicable  Comments: Pt able to follow exercise prescription today without complaint.  Will continue to monitor for progression.    Dr. Emily Filbert is Medical Director for [place] and [facility].

## 2018-11-11 ENCOUNTER — Encounter: Payer: Medicare Other | Admitting: *Deleted

## 2018-11-11 DIAGNOSIS — J449 Chronic obstructive pulmonary disease, unspecified: Secondary | ICD-10-CM | POA: Diagnosis not present

## 2018-11-11 NOTE — Telephone Encounter (Signed)
Spoke to pt for update, who stated that he would like to continue [place] and does not wish to switch at this time.  Nothing further is needed.

## 2018-11-11 NOTE — Progress Notes (Signed)
Daily Session Note  Patient Details  Name: Gordon Cameron MRN: [id number] Date of Birth: 1934/07/03 Referring Provider:     Pulmonary Rehab from 03/30/2018 in [facility] Cardiac and Pulmonary Rehab  Referring Provider  Ashby Dawes      Encounter Date: 11/11/2018  Check In: Session Check In - 11/11/18 1521      Check-In   Supervising physician immediately available to respond to emergencies  See telemetry face sheet for immediately available ER MD    Location  [facility] Re[facility]    Staff Present  Gerlene Burdock, RN, BSN-BC, CCRP;Joseph Foy [place], [place], ACSM CEP, Exercise Physiologist    Virtual Visit  No    Medication changes reported      No    Fall or balance concerns reported     No    Warm-up and Cool-down  Performed on first and last piece of equipment    Resistance Training Performed  Yes    VAD Patient?  No    PAD/SET Patient?  No      Pain Assessment   Currently in Pain?  No/denies          Social History   Tobacco Use  Smoking Status Never Smoker  Smokeless Tobacco Never Used    Goals Met:  Independence with exercise equipment Exercise tolerated well No report of cardiac concerns or symptoms Strength training completed today  Goals Unmet:  Not Applicable  Comments: Pt able to follow exercise prescription today without complaint.  Will continue to monitor for progression.    Dr. Emily Filbert is Medical Director for [place] and [facility].

## 2018-11-17 ENCOUNTER — Other Ambulatory Visit: Payer: Self-pay

## 2018-11-17 ENCOUNTER — Encounter: Payer: Medicare Other | Admitting: *Deleted

## 2018-11-17 DIAGNOSIS — J449 Chronic obstructive pulmonary disease, unspecified: Secondary | ICD-10-CM | POA: Diagnosis not present

## 2018-11-17 NOTE — Progress Notes (Signed)
Daily Session Note  Patient Details  Name: Gordon Cameron MRN: [id number] Date of Birth: [birth year] Referring Provider:     Pulmonary Rehab from 03/30/2018 in [facility] Cardiac and Pulmonary Rehab  Referring Provider  Ashby Dawes      Encounter Date: 11/17/2018  Check In: Session Check In - 11/17/18 1553      Check-In   Supervising physician immediately available to respond to emergencies  See telemetry face sheet for immediately available ER MD    Location  [facility] Re[facility]    Staff Present  Renita Papa, RN Vickki Hearing, BA, ACSM CEP, Exercise Physiologist;Jeanna Durrell BS, Exercise Physiologist    Virtual Visit  No    Medication changes reported      No    Fall or balance concerns reported     No    Warm-up and Cool-down  Performed on first and last piece of equipment    Resistance Training Performed  Yes    VAD Patient?  No    PAD/SET Patient?  No      Pain Assessment   Currently in Pain?  No/denies          Social History   Tobacco Use  Smoking Status Never Smoker  Smokeless Tobacco Never Used    Goals Met:  Independence with exercise equipment Exercise tolerated well No report of cardiac concerns or symptoms Strength training completed today  Goals Unmet:  Not Applicable  Comments: Pt able to follow exercise prescription today without complaint.  Will continue to monitor for progression.    Dr. Emily Filbert is Medical Director for [place] and [facility].

## 2018-11-18 ENCOUNTER — Encounter: Payer: Medicare Other | Admitting: *Deleted

## 2018-11-18 DIAGNOSIS — J449 Chronic obstructive pulmonary disease, unspecified: Secondary | ICD-10-CM

## 2018-11-18 NOTE — Progress Notes (Signed)
Daily Session Note  Patient Details  Name: Gordon Cameron MRN: [id number] Date of Birth: 10-29-1934 Referring Provider:     Pulmonary Rehab from 03/30/2018 in [facility] Cardiac and Pulmonary Rehab  Referring Provider  Ramachandran      Encounter Date: 11/18/2018  Check In: Session Check In - 11/18/18 1539      Check-In   Staff Present  Heath Lark, RN, BSN, CCRP;Meredith Sherryll Burger, RN BSN;Joseph Darrin Nipper, [place], [place], [place], CCET    Virtual Visit  No    Medication changes reported      No    Fall or balance concerns reported     No    Warm-up and Cool-down  Performed on first and last piece of equipment    Resistance Training Performed  Yes    VAD Patient?  No    PAD/SET Patient?  No      Pain Assessment   Currently in Pain?  No/denies          Social History   Tobacco Use  Smoking Status Never Smoker  Smokeless Tobacco Never Used    Goals Met:  Independence with exercise equipment Exercise tolerated well No report of cardiac concerns or symptoms  Goals Unmet:  Not Applicable  Comments: Pt able to follow exercise prescription today without complaint.  Will continue to monitor for progression.    Dr. Emily Filbert is Medical Director for [place] and [facility].

## 2018-11-22 ENCOUNTER — Encounter: Payer: Medicare Other | Attending: Internal Medicine | Admitting: *Deleted

## 2018-11-22 ENCOUNTER — Other Ambulatory Visit: Payer: Self-pay

## 2018-11-22 DIAGNOSIS — J449 Chronic obstructive pulmonary disease, unspecified: Secondary | ICD-10-CM

## 2018-11-22 NOTE — Progress Notes (Signed)
Daily Session Note  Patient Details  Name: CALIBER LANDESS MRN: [id number] Date of Birth: 1934/11/08 Referring Provider:     Pulmonary Rehab from 03/30/2018 in [facility] Cardiac and Pulmonary Rehab  Referring Provider  Ashby Dawes      Encounter Date: 11/22/2018  Check In: Session Check In - 11/22/18 1538      Check-In   Supervising physician immediately available to respond to emergencies  See telemetry face sheet for immediately available ER MD    Location  [facility]Card[facility] Re[facility]    Staff Present  Renita Papa, RN BSN;Jessica McKenney, MA, RCEP, CCRP, [place], BS, ACSM CEP, Exercise Physiologist    Virtual Visit  No    Medication changes reported      No    Fall or balance concerns reported     No    Warm-up and Cool-down  Performed on first and last piece of equipment    Resistance Training Performed  Yes    VAD Patient?  No    PAD/SET Patient?  No      Pain Assessment   Currently in Pain?  No/denies          Social History   Tobacco Use  Smoking Status Never Smoker  Smokeless Tobacco Never Used    Goals Met:  Independence with exercise equipment Exercise tolerated well No report of cardiac concerns or symptoms Strength training completed today  Goals Unmet:  Not Applicable  Comments: Pt able to follow exercise prescription today without complaint.  Will continue to monitor for progression.    Dr. Emily Filbert is Medical Director for [place] and [facility].

## 2018-11-24 ENCOUNTER — Other Ambulatory Visit: Payer: Self-pay

## 2018-11-24 ENCOUNTER — Encounter: Payer: Medicare Other | Admitting: *Deleted

## 2018-11-24 ENCOUNTER — Encounter: Payer: Self-pay | Admitting: *Deleted

## 2018-11-24 DIAGNOSIS — J449 Chronic obstructive pulmonary disease, unspecified: Secondary | ICD-10-CM

## 2018-11-24 NOTE — Progress Notes (Signed)
Pulmonary Individual Treatment Plan  Patient Details  Name: Gordon Cameron MRN: [id number] Date of Birth: 05/12/1934 Referring Provider:     Pulmonary Rehab from 03/30/2018 in [facility]  Referring Provider  Ramachandran      Initial Encounter Date:    Pulmonary Rehab from 03/30/2018 in [facility]  Date  03/30/18      Visit Diagnosis: Chronic obstructive pulmonary disease, unspecified COPD type (HCC)  Patient's Home Medications on Admission:  Current Outpatient Medications:  .  Aclidinium Bromide (TUDORZA PRESSAIR) 400 MCG/ACT AEPB, Inhale 1 puff into the lungs 2 (two) times daily., Disp: 1 each, Rfl: 0 .  Aclidinium Bromide (TUDORZA PRESSAIR) 400 MCG/ACT AEPB, Inhale 1 puff into the lungs daily., Disp: 1 each, Rfl: 0 .  albuterol (VENTOLIN HFA) 108 (90 Base) MCG/ACT inhaler, Inhale 2 puffs into the lungs every 6 (six) hours as needed for wheezing or shortness of breath., Disp: 1 Inhaler, Rfl: 5 .  cetirizine (ZYRTEC) 10 MG tablet, Take 10 mg by mouth daily as needed. , Disp: , Rfl:  .  finasteride (PROSCAR) 5 MG tablet, Once a day, Disp: , Rfl:  .  fluticasone (FLONASE) 50 MCG/ACT nasal spray, Place 2 sprays into both nostrils daily., Disp: , Rfl:  .  furosemide (LASIX) 20 MG tablet, Take 40 mg by mouth daily. , Disp: , Rfl: 2 .  spironolactone (ALDACTONE) 25 MG tablet, Take 25 mg by mouth daily., Disp: , Rfl:   Past Medical History: Past Medical History:  Diagnosis Date  . BPH (benign prostatic hyperplasia)   . Hyperlipidemia   . OSA (obstructive sleep apnea)   . Reactive airway disease   . Restless leg     Tobacco Use: Social History   Tobacco Use  Smoking Status Never Smoker  Smokeless Tobacco Never Used    Labs: Recent Review Flowsheet Data    There is no flowsheet data to display.       Pulmonary Assessment Scores:   UCSD: Self-administered rating of dyspnea associated with activities of daily living  (ADLs) 6-point scale (0 = "not at all" to 5 = "maximal or unable to do because of breathlessness")  Scoring Scores range from 0 to 120.  Minimally important difference is 5 units  CAT: CAT can identify the health impairment of COPD patients and is better correlated with disease progression.  CAT has a scoring range of zero to 40. The CAT score is classified into four groups of low (less than 10), medium (10 - 20), high (21-30) and very high (31-40) based on the impact level of disease on health status. A CAT score over 10 suggests significant symptoms.  A worsening CAT score could be explained by an exacerbation, poor medication adherence, poor inhaler technique, or progression of COPD or comorbid conditions.  CAT MCID is 2 points  mMRC: mMRC (Modified Medical Research Council) Dyspnea Scale is used to assess the degree of baseline functional disability in patients of respiratory disease due to dyspnea. No minimal important difference is established. A decrease in score of 1 point or greater is considered a positive change.   Pulmonary Function Assessment:   Exercise Target Goals: Exercise Program Goal: Individual exercise prescription set using results from initial 6 min walk test and THRR while considering  patient's activity barriers and safety.   Exercise Prescription Goal: Initial exercise prescription builds to 30-45 minutes a day of aerobic activity, 2-3 days per week.  Home exercise guidelines will be given   to patient during program as part of exercise prescription that the participant will acknowledge.  Activity Barriers & Risk Stratification:   6 Minute Walk:  Oxygen Initial Assessment:   Oxygen Re-Evaluation: Oxygen Re-Evaluation    Row Name 06/16/18 1137 07/13/18 1135 08/02/18 1554 09/30/18 1551 11/04/18 1618     Program Oxygen Prescription   Program Oxygen Prescription  None  None  None  None  None     Home Oxygen   Home Oxygen Device  None  None  None  None  -    Sleep Oxygen Prescription  CPAP  CPAP  CPAP  CPAP  CPAP   Liters per minute  0  0  0  0  0   Home Exercise Oxygen Prescription  None  None  None  None  None   Home at Rest Exercise Oxygen Prescription  None  None  None  None  None   Compliance with Home Oxygen Use  Yes  Yes  Yes  Yes  Yes     Goals/Expected Outcomes   Short Term Goals  To learn and demonstrate proper use of respiratory medications;To learn and demonstrate proper pursed lip breathing techniques or other breathing techniques.;To learn and understand importance of maintaining oxygen saturations>88%;To learn and understand importance of monitoring SPO2 with pulse oximeter and demonstrate accurate use of the pulse oximeter.  To learn and demonstrate proper use of respiratory medications;To learn and demonstrate proper pursed lip breathing techniques or other breathing techniques.;To learn and understand importance of maintaining oxygen saturations>88%;To learn and understand importance of monitoring SPO2 with pulse oximeter and demonstrate accurate use of the pulse oximeter.  To learn and demonstrate proper use of respiratory medications;To learn and demonstrate proper pursed lip breathing techniques or other breathing techniques.;To learn and understand importance of maintaining oxygen saturations>88%;To learn and understand importance of monitoring SPO2 with pulse oximeter and demonstrate accurate use of the pulse oximeter.  To learn and exhibit compliance with exercise, home and travel O2 prescription;To learn and understand importance of monitoring SPO2 with pulse oximeter and demonstrate accurate use of the pulse oximeter.;To learn and understand importance of maintaining oxygen saturations>88%;To learn and demonstrate proper pursed lip breathing techniques or other breathing techniques.;To learn and demonstrate proper use of respiratory medications  To learn and exhibit compliance with exercise, home and travel O2 prescription;To learn  and understand importance of monitoring SPO2 with pulse oximeter and demonstrate accurate use of the pulse oximeter.;To learn and understand importance of maintaining oxygen saturations>88%;To learn and demonstrate proper pursed lip breathing techniques or other breathing techniques.;To learn and demonstrate proper use of respiratory medications   Long  Term Goals  Verbalizes importance of monitoring SPO2 with pulse oximeter and return demonstration;Maintenance of O2 saturations>88%;Exhibits proper breathing techniques, such as pursed lip breathing or other method taught during program session;Compliance with respiratory medication;Demonstrates proper use of MDI's  Verbalizes importance of monitoring SPO2 with pulse oximeter and return demonstration;Maintenance of O2 saturations>88%;Exhibits proper breathing techniques, such as pursed lip breathing or other method taught during program session;Compliance with respiratory medication;Demonstrates proper use of MDI's  Verbalizes importance of monitoring SPO2 with pulse oximeter and return demonstration;Maintenance of O2 saturations>88%;Exhibits proper breathing techniques, such as pursed lip breathing or other method taught during program session;Compliance with respiratory medication;Demonstrates proper use of MDI's  Exhibits compliance with exercise, home and travel O2 prescription;Verbalizes importance of monitoring SPO2 with pulse oximeter and return demonstration;Maintenance of O2 saturations>88%;Exhibits proper breathing techniques, such as pursed lip breathing or other method  taught during program session;Demonstrates proper use of MDI's;Compliance with respiratory medication  Exhibits compliance with exercise, home and travel O2 prescription;Verbalizes importance of monitoring SPO2 with pulse oximeter and return demonstration;Maintenance of O2 saturations>88%;Exhibits proper breathing techniques, such as pursed lip breathing or other method taught during  program session;Demonstrates proper use of MDI's;Compliance with respiratory medication   Comments  Gordon Cameron has been using his PLB and compliant with his CPAP.  His breathing still has room for improvement.  He has not been using his inspirometer, so we talked about using it more regulalry to help strengthen his intercostals.  Gordon Cameron continues to practice PLB and is using his CPAP. He still reports mild SOB, but nothing abnormal for him  Gordon Cameron has been compliant with his CPAP.  He uses his PLB, but his SOB stays about the same.  Patient is wearing his CPAP every night. He takes his albuterol when he needs it. He has yet to get a pulse oximeter. Informed patient where and how to obtain a pulse oximeter.  Bond continues to use CPAP every night.  He uses a full face mask currently, but since his surgery it is not fitting as well as it used to.  He will have a new sleep study once he meets his new doctor.  He still has more SOB then he would like but it is getting better.  He is using his PLB to help, but sometimes forgets as it is not habit yet.  He has also started a new inhaler and hopes that it will start to help as well.  He still has not gotten a pulse oximeter yet, but is planning on it.   Goals/Expected Outcomes  Short: Start using his inspirometer daily.  Long: Continue to work on breathing  Short: continue using PLB, increase physical activity. Long: independence with managing SOB  Short: continue using PLB, increase physical activity. Long: independence with managing SOB  Short:obtain a pulse oximeter. Long: monitor oxygen levels on a daily basis.  `      Oxygen Discharge (Final Oxygen Re-Evaluation): Oxygen Re-Evaluation - 11/04/18 1618      Program Oxygen Prescription   Program Oxygen Prescription  None      Home Oxygen   Sleep Oxygen Prescription  CPAP    Liters per minute  0    Home Exercise Oxygen Prescription  None    Home at Rest Exercise Oxygen Prescription  None    Compliance with  Home Oxygen Use  Yes      Goals/Expected Outcomes   Short Term Goals  To learn and exhibit compliance with exercise, home and travel O2 prescription;To learn and understand importance of monitoring SPO2 with pulse oximeter and demonstrate accurate use of the pulse oximeter.;To learn and understand importance of maintaining oxygen saturations>88%;To learn and demonstrate proper pursed lip breathing techniques or other breathing techniques.;To learn and demonstrate proper use of respiratory medications    Long  Term Goals  Exhibits compliance with exercise, home and travel O2 prescription;Verbalizes importance of monitoring SPO2 with pulse oximeter and return demonstration;Maintenance of O2 saturations>88%;Exhibits proper breathing techniques, such as pursed lip breathing or other method taught during program session;Demonstrates proper use of MDI's;Compliance with respiratory medication    Comments  Gordon Cameron continues to use CPAP every night.  He uses a full face mask currently, but since his surgery it is not fitting as well as it used to.  He will have a new sleep study once he meets his new doctor.  He  still has more SOB then he would like but it is getting better.  He is using his PLB to help, but sometimes forgets as it is not habit yet.  He has also started a new inhaler and hopes that it will start to help as well.  He still has not gotten a pulse oximeter yet, but is planning on it.    Goals/Expected Outcomes  `       Initial Exercise Prescription:   Perform Capillary Blood Glucose checks as needed.  Exercise Prescription Changes: Exercise Prescription Changes    Row Name 09/10/18 0800 10/12/18 1500 11/02/18 1000 11/10/18 1400       Response to Exercise   Blood Pressure (Admit)  98/52  104/64  110/58  112/56    Blood Pressure (Exercise)  120/70  124/64  118/70  114/56    Blood Pressure (Exit)  118/70  110/60  118/62  126/70    Heart Rate (Admit)  78 bpm  75 bpm  86 bpm  80 bpm     Heart Rate (Exercise)  106 bpm  94 bpm  101 bpm  100 bpm    Heart Rate (Exit)  90 bpm  84 bpm  92 bpm  93 bpm    Oxygen Saturation (Admit)  97 %  97 %  96 %  97 %    Oxygen Saturation (Exercise)  96 %  96 %  96 %  97 %    Oxygen Saturation (Exit)  97 %  96 %  95 %  98 %    Rating of Perceived Exertion (Exercise)  12  12  11  12     Perceived Dyspnea (Exercise)  2  2  2  2     Symptoms  none  none  none  none    Duration  Progress to 30 minutes of  aerobic without signs/symptoms of physical distress  Continue with 30 min of aerobic exercise without signs/symptoms of physical distress.  Continue with 30 min of aerobic exercise without signs/symptoms of physical distress.  Continue with 30 min of aerobic exercise without signs/symptoms of physical distress.    Intensity  THRR unchanged  THRR unchanged  THRR unchanged  THRR unchanged      Progression   Progression  Continue to progress workloads to maintain intensity without signs/symptoms of physical distress.  Continue to progress workloads to maintain intensity without signs/symptoms of physical distress.  Continue to progress workloads to maintain intensity without signs/symptoms of physical distress.  Continue to progress workloads to maintain intensity without signs/symptoms of physical distress.    Average METs  2.3  2.17  2.46  2      Resistance Training   Training Prescription  Yes  Yes  Yes  Yes    Weight  3 lbs  3 lbs  3 lbs  3 lb    Reps  10-15  10-15  10-15  10-15      Interval Training   Interval Training  No  No  No  No      Treadmill   MPH  1.7  1.7  -  -    Grade  0  0  -  -    Minutes  15  15  -  -    METs  2.3  2.3  -  -      Recumbant Bike   Level  -  2  3  -    Watts  -  -  25  -    Minutes  -  15  15  -    METs  -  -  2.93  -      T5 Nustep   Level  2  2  -  -    SPM  80  -  -  -    Minutes  15  15  -  -    METs  2.2  2.2  -  -      Biostep-RELP   Level  -  1  4  4     SPM  -  -  -  50    Minutes  -  15  15   15     METs  -  2  2  2       Home Exercise Plan   Plans to continue exercise at  -  Home (comment) walking  Home (comment) walking  Home (comment) walking    Frequency  -  Add 3 additional days to program exercise sessions.  Add 3 additional days to program exercise sessions.  Add 3 additional days to program exercise sessions.    Initial Home Exercises Provided  -  04/14/18  04/14/18  04/14/18       Exercise Comments: Exercise Comments    Row Name 09/09/18 1717           Exercise Comments  Counseled pt on increased weight >3lbs, pt stated he was aware, he went to his PCP and did not take his Lasix as ordered, he also stated he ate before coming to class. Pt is asymptomatic and will resume Lasix as ordered by MD.          Exercise Goals and Review:   Exercise Goals Re-Evaluation : Exercise Goals Re-Evaluation    Row Name 06/16/18 1132 07/13/18 1126 08/02/18 1549 09/10/18 0859 09/30/18 1546     Exercise Goal Re-Evaluation   Exercise Goals Review  Increase Physical Activity;Increase Strength and Stamina;Understanding of Exercise Prescription  Increase Physical Activity;Increase Strength and Stamina;Understanding of Exercise Prescription  Increase Physical Activity;Increase Strength and Stamina;Understanding of Exercise Prescription  Increase Physical Activity;Increase Strength and Stamina;Able to understand and use rate of perceived exertion (RPE) scale;Able to understand and use Dyspnea scale;Knowledge and understanding of Target Heart Rate Range (THRR);Able to check pulse independently;Understanding of Exercise Prescription  Increase Physical Activity;Increase Strength and Stamina;Able to understand and use Dyspnea scale;Understanding of Exercise Prescription   Comments  Gordon Cameron has been doing well at home. He has not been as good at getting in his exercise over the last couple of weeks.  He says that they have been busy doing things around the house, so he is staying active.   Gordon Cameron  reports continuing to do well at home. He has been slowly increasing activity, but weather has gotten in the way. He stays busy around the house with his wife as company  Gordon Cameron continues to do well at home.  He is walking some.  He feels that he has improved since his first session in rehab and feels that he has gotten stronger.  He is feeling good overall.  Gordon Cameron has done well since returning to in person sessions.  He works at at [employer].  Staff will monitor progress  Gordon Cameron wants to be able to walk faster and go longer distances. He wants to go up in weights on his strength training. He is only able to eat soft foods and is not eating the best  since he needs oral surgery.   Expected Outcomes  Short: Get back into routine of exericse.  Long: Conitnue to stay active.  Short: add an extra day of intentional exercise. Long: develop exercise routine that is sustainable.  Short:Continue to make time to exercise. Long: Continue to build stamina.  Short - work towards target HR range Long - build stamina  Short: get oral surgery to maintain nutrtion to improve strength and staminia. Long: maintain exercise and increase work loads.   Gordon Cameron Name 10/12/18 1552 11/02/18 1023 11/04/18 1609 11/10/18 1445       Exercise Goal Re-Evaluation   Exercise Goals Review  Increase Physical Activity;Increase Strength and Stamina;Understanding of Exercise Prescription  Increase Physical Activity;Increase Strength and Stamina;Understanding of Exercise Prescription  Increase Physical Activity;Increase Strength and Stamina;Understanding of Exercise Prescription  Increase Physical Activity;Increase Strength and Stamina;Able to understand and use rate of perceived exertion (RPE) scale;Knowledge and understanding of Target Heart Rate Range (THRR);Able to check pulse independently;Understanding of Exercise Prescription    Comments  Gordon Cameron is doing well in rehab.  His attendance has been lacking recently due to his oral surgery needs.  He  wants to keep coming.  He is up to 2 METs on the BioStep.  We will continue to monitor his progress.  Raun continues to do well in rehab. He was able to return to his workloads.  We will continue to move up workloads and monitor his progress.  Gordon Cameron is doing well in rehab.  He has increased some workloads.  He has not been doing his home exercise but is staying active by working around the house.  He is feeling stronger and has some more stamina, he wishes it would recover faster.  We have now added in Mondays for three days a week as well.  Gordon Cameron is not reaching THR range and has been low on RPE at times- staff will encourage him to increase levels to reach these goals    Expected Outcomes  Short: Attend rehab regularly again.  Long: Continue to exericse on his own at home on off days.  Short: Increase workloads.  Long: Continue to increase stamina.  Short: Start coming three days a week.  Long: Continue to work on stamina.  Short - work in [employer] range and RPE 11-13 each visit Long - increase overall MET level       Discharge Exercise Prescription (Final Exercise Prescription Changes): Exercise Prescription Changes - 11/10/18 1400      Response to Exercise   Blood Pressure (Admit)  112/56    Blood Pressure (Exercise)  114/56    Blood Pressure (Exit)  126/70    Heart Rate (Admit)  80 bpm    Heart Rate (Exercise)  100 bpm    Heart Rate (Exit)  93 bpm    Oxygen Saturation (Admit)  97 %    Oxygen Saturation (Exercise)  97 %    Oxygen Saturation (Exit)  98 %    Rating of Perceived Exertion (Exercise)  12    Perceived Dyspnea (Exercise)  2    Symptoms  none    Duration  Continue with 30 min of aerobic exercise without signs/symptoms of physical distress.    Intensity  THRR unchanged      Progression   Progression  Continue to progress workloads to maintain intensity without signs/symptoms of physical distress.    Average METs  2      Resistance Training   Training Prescription  Yes  Weight  3 lb    Reps  10-15      Interval Training   Interval Training  No      Biostep-RELP   Level  4    SPM  50    Minutes  15    METs  2      Home Exercise Plan   Plans to continue exercise at  Home (comment)   walking   Frequency  Add 3 additional days to program exercise sessions.    Initial Home Exercises Provided  04/14/18       Nutrition:  Target Goals: Understanding of nutrition guidelines, daily intake of sodium <1511m, cholesterol <2033m calories 30% from fat and 7% or less from saturated fats, daily to have 5 or more servings of fruits and vegetables.  Biometrics:    Nutrition Therapy Plan and Nutrition Goals:   Nutrition Assessments:   Nutrition Goals Re-Evaluation: Nutrition Goals Re-Evaluation    [place] 11/08/18 1547             Goals   Nutrition Goal  ST:/LT: continue HH eating and current diet plan, manage fluid       Comment  Can eat more kinds of foods with new dental surgeries. Still on lasiks and is doing well. No loss of appetite or trouble breathing while eating. Pt reports eating lots of chicken, with some red meat. Pt reports he is constrained to wifes limitations such as vegetables. Pt reports still watching sodium, no table salt and reading labels. Pt wants to continue with his current diet plan.       Expected Outcome  ST:/LT: continue HH eating and current diet plan, manage fluid          Nutrition Goals Discharge (Final Nutrition Goals Re-Evaluation): Nutrition Goals Re-Evaluation - 11/08/18 1547      Goals   Nutrition Goal  ST:/LT: continue HH eating and current diet plan, manage fluid    Comment  Can eat more kinds of foods with new dental surgeries. Still on lasiks and is doing well. No loss of appetite or trouble breathing while eating. Pt reports eating lots of chicken, with some red meat. Pt reports he is constrained to wifes limitations such as vegetables. Pt reports still watching sodium, no table salt and reading labels.  Pt wants to continue with his current diet plan.    Expected Outcome  ST:/LT: continue HH eating and current diet plan, manage fluid       Psychosocial: Target Goals: Acknowledge presence or absence of significant depression and/or stress, maximize coping skills, provide positive support system. Participant is able to verbalize types and ability to use techniques and skills needed for reducing stress and depression.   Initial Review & Psychosocial Screening:   Quality of Life Scores:  Scores of 19 and below usually indicate a poorer quality of life in these areas.  A difference of  2-3 points is a clinically meaningful difference.  A difference of 2-3 points in the total score of the Quality of Life Index has been associated with significant improvement in overall quality of life, self-image, physical symptoms, and general health in studies assessing change in quality of life.  PHQ-9: Recent Review Flowsheet Data    Dep[facility] 03/30/2018   Decreased Interest 0   Down, Depressed, Hopeless 0   PHQ - 2 Score 0   Altered sleeping 1   Tired, decreased energy 1   Change in appetite 1   Feeling bad  or failure about yourself  0   Trouble concentrating 0   Moving slowly or fidgety/restless 0   Suicidal thoughts 0   PHQ-9 Score 3   Difficult doing work/chores Not difficult at all     Interpretation of Total Score  Total Score Depression Severity:  1-4 = Minimal depression, 5-9 = Mild depression, 10-14 = Moderate depression, 15-19 = Moderately severe depression, 20-27 = Severe depression   Psychosocial Evaluation and Intervention:   Psychosocial Re-Evaluation: Psychosocial Re-Evaluation    [place] Name 06/16/18 1134 07/13/18 1133 08/02/18 1550 09/30/18 1556 11/04/18 1615     Psychosocial Re-Evaluation   Current issues with  Current Stress Concerns  Current Stress Concerns  Current Stress Concerns  Current Stress Concerns  Current Stress Concerns   Comments  Laterrance has been  doing well mentally. They have been staying home and keeping busy with things around the house.  He has gotten away from exercise but will try to get back into the routine.   Gordon Cameron reports "feeling good considering the circumstances" and "doing as good as everyone else." He and his wife stay busy around the house which helps him feel good  Gordon Cameron is doing okay.  He tries to stay busy and is able to do some things.  His health continues to be his biggest stressor.  Patient is stressed about his oral surgery that is coming up. He is not able to eat what he wants and has to eat things that he is able to gum. His surgery is next Wednesday on his mouth.  Gordon Cameron is doing well mentally.  He denies any symptoms of depression or anxiety.  He gets frustrated that he can't do as much as he used to do.  He sleeps pretty good and uses his CPAP.  He will have a new pulmonologist and sleep study.  He usually falls asleep in the recliner.   Expected Outcomes  Short: Continue to stay active.  Long; Continue to stay positive.   Short: add extra day of exericse to help with mood. Long: stay upbeat and find a "new normal" during all whats going on with COVID  Short: Exercise regularly for mood boost.  Long: Continue to stay positive.  Short: get his oral surgery. long: maintain exercise prescription to minimize stress.  Short: Meet new pulmonologist.  Long: Continue to stay positive.   Interventions  -  Encouraged to attend Pulmonary Rehabilitation for the exercise  -  Encouraged to attend Pulmonary Rehabilitation for the exercise  Encouraged to attend Pulmonary Rehabilitation for the exercise   Continue Psychosocial Services   -  -  -  Follow up required by staff  Follow up required by staff      Psychosocial Discharge (Final Psychosocial Re-Evaluation): Psychosocial Re-Evaluation - 11/04/18 1615      Psychosocial Re-Evaluation   Current issues with  Current Stress Concerns    Comments  Gordon Cameron is doing well mentally.  He  denies any symptoms of depression or anxiety.  He gets frustrated that he can't do as much as he used to do.  He sleeps pretty good and uses his CPAP.  He will have a new pulmonologist and sleep study.  He usually falls asleep in the recliner.    Expected Outcomes  Short: Meet new pulmonologist.  Long: Continue to stay positive.    Interventions  Encouraged to attend Pulmonary Rehabilitation for the exercise    Continue Psychosocial Services   Follow up required by staff  Education: Education Goals: Education classes will be provided on a weekly basis, covering required topics. Participant will state understanding/return demonstration of topics presented.  Learning Barriers/Preferences:   Education Topics:  Initial Evaluation Education: - Verbal, written and demonstration of respiratory meds, oximetry and breathing techniques. Instruction on use of nebulizers and MDIs and importance of monitoring MDI activations.   Cardiac Rehab from 11/11/2018 in [facility] Cardiac and Pulmonary Rehab  Date  03/30/18  Educator  [facility]  Instruction Review Code  1- Verbalizes Understanding      General Nutrition Guidelines/Fats and Fiber: -Group instruction provided by verbal, written material, models and posters to present the general guidelines for heart healthy nutrition. Gives an explanation and review of dietary fats and fiber.   Controlling Sodium/Reading Food Labels: -Group verbal and written material supporting the discussion of sodium use in heart healthy nutrition. Review and explanation with models, verbal and written materials for utilization of the food label.   Exercise Physiology & General Exercise Guidelines: - Group verbal and written instruction with models to review the exercise physiology of the cardiovascular system and associated critical values. Provides general exercise guidelines with specific guidelines to those with heart or lung disease.    Aerobic Exercise & Resistance  Training: - Gives group verbal and written instruction on the various components of exercise. Focuses on aerobic and resistive training programs and the benefits of this training and how to safely progress through these programs.   Cardiac Rehab from 11/11/2018 in [facility] Cardiac and Pulmonary Rehab  Date  11/11/18  Educator  [facility]  Instruction Review Code  1- Verbalizes Understanding      Flexibility, Balance, Mind/Body Relaxation: Provides group verbal/written instruction on the benefits of flexibility and balance training, including mind/body exercise modes such as yoga, pilates and tai chi.  Demonstration and skill practice provided.   Stress and Anxiety: - Provides group verbal and written instruction about the health risks of elevated stress and causes of high stress.  Discuss the correlation between heart/lung disease and anxiety and treatment options. Review healthy ways to manage with stress and anxiety.   Depression: - Provides group verbal and written instruction on the correlation between heart/lung disease and depressed mood, treatment options, and the stigmas associated with seeking treatment.   Exercise & Equipment Safety: - Individual verbal instruction and demonstration of equipment use and safety with use of the equipment.   Cardiac Rehab from 11/11/2018 in [facility] Cardiac and Pulmonary Rehab  Date  03/30/18  Educator  [facility]  Instruction Review Code  1- Verbalizes Understanding      Infection Prevention: - Provides verbal and written material to individual with discussion of infection control including proper hand washing and proper equipment cleaning during exercise session.   Cardiac Rehab from 11/11/2018 in [facility] Cardiac and Pulmonary Rehab  Date  03/30/18  Educator  [facility]  Instruction Review Code  1- Verbalizes Understanding      Falls Prevention: - Provides verbal and written material to individual with discussion of falls prevention and safety.   Cardiac Rehab from  11/11/2018 in [facility] Cardiac and Pulmonary Rehab  Date  03/30/18  Educator  [facility]  Instruction Review Code  1- Verbalizes Understanding      Diabetes: - Individual verbal and written instruction to review signs/symptoms of diabetes, desired ranges of glucose level fasting, after meals and with exercise. Advice that pre and post exercise glucose checks will be done for 3 sessions at entry of program.   Chronic Lung Diseases: - Group  verbal and written instruction to review updates, respiratory medications, advancements in procedures and treatments. Discuss use of supplemental oxygen including available portable oxygen systems, continuous and intermittent flow rates, concentrators, personal use and safety guidelines. Review proper use of inhaler and spacers. Provide informative websites for self-education.    Energy Conservation: - Provide group verbal and written instruction for methods to conserve energy, plan and organize activities. Instruct on pacing techniques, use of adaptive equipment and posture/positioning to relieve shortness of breath.   Triggers and Exacerbations: - Group verbal and written instruction to review types of environmental triggers and ways to prevent exacerbations. Discuss weather changes, air quality and the benefits of nasal washing. Review warning signs and symptoms to help prevent infections. Discuss techniques for effective airway clearance, coughing, and vibrations.   AED/CPR: - Group verbal and written instruction with the use of models to demonstrate the basic use of the AED with the basic ABC's of resuscitation.   Anatomy and Physiology of the Lungs: - Group verbal and written instruction with the use of models to provide basic lung anatomy and physiology related to function, structure and complications of lung disease.   Anatomy & Physiology of the Heart: - Group verbal and written instruction and models provide basic cardiac anatomy and physiology, with  the coronary electrical and arterial systems. Review of Valvular disease and Heart Failure   Cardiac Medications: - Group verbal and written instruction to review commonly prescribed medications for heart disease. Reviews the medication, class of the drug, and side effects.   Know Your Numbers and Risk Factors: -Group verbal and written instruction about important numbers in your health.  Discussion of what are risk factors and how they play a role in the disease process.  Review of Cholesterol, Blood Pressure, Diabetes, and BMI and the role they play in your overall health.   Cardiac Rehab from 11/11/2018 in [facility] Cardiac and Pulmonary Rehab  Date  11/11/18  Educator  [facility]  Instruction Review Code  1- Verbalizes Understanding      Sleep Hygiene: -Provides group verbal and written instruction about how sleep can affect your health.  Define sleep hygiene, discuss sleep cycles and impact of sleep habits. Review good sleep hygiene tips.    Other: -Provides group and verbal instruction on various topics (see comments)    Knowledge Questionnaire Score:    Core Components/Risk Factors/Patient Goals at Admission:   Core Components/Risk Factors/Patient Goals Review:  Goals and Risk Factor Review    Row Name 06/16/18 1136 07/13/18 1129 08/02/18 1552 09/30/18 1559 11/04/18 1617     Core Components/Risk Factors/Patient Goals Review   Personal Goals Review  Weight Management/Obesity;Hypertension;Improve shortness of breath with ADL's  Weight Management/Obesity;Hypertension;Improve shortness of breath with ADL's  Weight Management/Obesity;Hypertension;Improve shortness of breath with ADL's  Weight Management/Obesity;Improve shortness of breath with ADL's  Weight Management/Obesity;Improve shortness of breath with ADL's   Review  Gordon Cameron has been doing well at home.  He has been able to lose some weight and keep it down.  His pressures continues to do well.  He is still having some SOB, but doing  well overall.   Gordon Cameron reports no issues at home managing his risk factors. His weight loss is going well. His SOB is doing okay, he said the weather hasnt been helping.Gordon Cameron has been struggling with his weight some and attributes it to increased fluid.  He is now on 40 mg of Lasix 3x a day.  It seems to help most days, but  some days he still feels heavy.  He checks his pressures at least twice a day and they have been staying pretty good.  He does get a few reading in the 90s but he denies any symptoms with these.  He is taking lasix for the fluid around his heart. His primary care is monitoring his lasix intake. Gordon Cameron has lost weight since mark and has reached his goal of weighing 190 pounds. He weighed in today at 188.5 pounds.  Gordon Cameron is staying around 190 lbs for his weight still.  He will go up and down.  He can eat better down after his oral surgery.  His breathing is getting better.   Expected Outcomes  Short: Continue to work on weight.  Long: Continue to monitor risk factors.   Short: continue to work on weight loss. Long: independence in management of risk factors.  Short: Continue to work on weight loss.  Long: Continue to monitor risk factors.  Short: improve on ADL. Long: be able to maintain ADL's independently.  Short: Continue to monitor weight.  Long; Continue to monitor risk factors.      Core Components/Risk Factors/Patient Goals at Discharge (Final Review):  Goals and Risk Factor Review - 11/04/18 1617      Core Components/Risk Factors/Patient Goals Review   Personal Goals Review  Weight Management/Obesity;Improve shortness of breath with ADL's    Review  Gordon Cameron is staying around 190 lbs for his weight still.  He will go up and down.  He can eat better down after his oral surgery.  His breathing is getting better.    Expected Outcomes  Short: Continue to monitor weight.  Long; Continue to monitor risk factors.       ITP Comments: ITP Comments    Row Name 08/19/18 1150  09/01/18 0553 09/23/18 1025 09/28/18 0913 09/29/18 0554   ITP Comments  Gordon Cameron has been doing the same across the board.  He said that he has not noticed any big changes.  He is now scheduled to return on 8/10 at 1045.  30 Day Review Completed today. Continue with ITP unless changed by Medical Director review.  Gordon Cameron is out this week for a dental procedure.  Unable to complete nutrition 30-day re-eval cycle due to inconsistent attendence  30 Day review. Continue with ITP unless directed changes per Medical Director review.   [place] Name 10/27/18 1256 11/24/18 0619         ITP Comments  30 day review completed. ITP sent to Dr. Emily Filbert, Medical Director of Cardiac and Pulmonary Rehab. Continue with ITP unless changes are made by physician.  Department closed starting 10/2 until further notice by infection prevention and Health at Work teams for COVID-19.  30 day review completed. Continue with ITP sent to Dr. Emily Filbert, Medical Director of Cardiac and Pulmonary Rehab for review , changes as needed and signature.         Comments:

## 2018-11-24 NOTE — Progress Notes (Signed)
Daily Session Note  Patient Details  Name: SOL ENGLERT MRN: [id number] Date of Birth: 1934/09/12 Referring Provider:     Pulmonary Rehab from 03/30/2018 in [facility] Cardiac and Pulmonary Rehab  Referring Provider  Ashby Dawes      Encounter Date: 11/24/2018  Check In: Session Check In - 11/24/18 1540      Check-In   Supervising physician immediately available to respond to emergencies  See telemetry face sheet for immediately available ER MD    Location  [facility]-Cardiac & Pulmonary Rehab    Staff Present  Renita Papa, RN Vickki Hearing, BA, ACSM CEP, Exercise Physiologist;Melissa Caiola RDN, LDN    Virtual Visit  No    Medication changes reported      No    Fall or balance concerns reported     No    Warm-up and Cool-down  Performed on first and last piece of equipment    Resistance Training Performed  Yes    VAD Patient?  No    PAD/SET Patient?  No      Pain Assessment   Currently in Pain?  No/denies          Social History   Tobacco Use  Smoking Status Never Smoker  Smokeless Tobacco Never Used    Goals Met:  Proper associated with RPD/PD & O2 Sat Independence with exercise equipment Using PLB without cueing & demonstrates good technique Exercise tolerated well No report of cardiac concerns or symptoms Strength training completed today  Goals Unmet:  Not Applicable  Comments: Pt able to follow exercise prescription today without complaint.  Will continue to monitor for progression.    Dr. Emily Filbert is Medical Director for [place] and [facility].

## 2018-11-25 ENCOUNTER — Other Ambulatory Visit: Payer: Self-pay

## 2018-11-25 ENCOUNTER — Telehealth: Payer: Self-pay | Admitting: Internal Medicine

## 2018-11-25 DIAGNOSIS — J449 Chronic obstructive pulmonary disease, unspecified: Secondary | ICD-10-CM | POA: Diagnosis not present

## 2018-11-25 NOTE — Telephone Encounter (Signed)
Left message for pt to relay date/time of covid test for sleep study.  11/29/2018 between 12:30-2:[facility]

## 2018-11-25 NOTE — Telephone Encounter (Signed)
Pt returned missed call 

## 2018-11-25 NOTE — Telephone Encounter (Signed)
Pt is aware of date/time of covid test.   

## 2018-11-25 NOTE — Progress Notes (Signed)
Daily Session Note  Patient Details  Name: Gordon Cameron MRN: [id number] Date of Birth: [birth year] Referring Provider:     Pulmonary Rehab from 03/30/2018 in [facility] Cardiac and Pulmonary Rehab  Referring Provider  Ashby Dawes      Encounter Date: 11/25/2018  Check In: Session Check In - 11/25/18 1546      Check-In   Supervising physician immediately available to respond to emergencies  See telemetry face sheet for immediately available ER MD    Location  [facility]-Cardiac & Pulmonary Rehab    Staff Present  Vida Rigger RN, Vickki Hearing, BA, ACSM CEP, Exercise Physiologist;Joseph Tessie Fass RCP,RRT,BSRT    Virtual Visit  No    Medication changes reported      No    Fall or balance concerns reported     No    Warm-up and Cool-down  Performed on first and last piece of equipment    Resistance Training Performed  Yes    VAD Patient?  No    PAD/SET Patient?  No      Pain Assessment   Currently in Pain?  No/denies    Multiple Pain Sites  No          Social History   Tobacco Use  Smoking Status Never Smoker  Smokeless Tobacco Never Used    Goals Met:  Proper associated with RPD/PD & O2 Sat Independence with exercise equipment Using PLB without cueing & demonstrates good technique Exercise tolerated well Queuing for purse lip breathing No report of cardiac concerns or symptoms Strength training completed today  Goals Unmet:  Not Applicable  Comments: Pt able to follow exercise prescription today without complaint.  Will continue to monitor for progression.   Dr. Emily Filbert is Medical Director for [place] and [facility].

## 2018-11-29 ENCOUNTER — Encounter: Payer: Medicare Other | Admitting: *Deleted

## 2018-11-29 ENCOUNTER — Ambulatory Visit: Payer: Medicare Other | Admitting: Internal Medicine

## 2018-11-29 ENCOUNTER — Other Ambulatory Visit: Payer: Self-pay

## 2018-11-29 ENCOUNTER — Other Ambulatory Visit
Admission: RE | Admit: 2018-11-29 | Discharge: 2018-11-29 | Disposition: A | Discharge status: Home / Self Care | Payer: Medicare Other | Source: Ambulatory Visit | Attending: Internal Medicine | Admitting: Internal Medicine

## 2018-11-29 DIAGNOSIS — Z20828 Contact with and (suspected) exposure to other viral communicable diseases: Secondary | ICD-10-CM | POA: Insufficient documentation

## 2018-11-29 DIAGNOSIS — Z01812 Encounter for preprocedural laboratory examination: Secondary | ICD-10-CM | POA: Insufficient documentation

## 2018-11-29 DIAGNOSIS — J449 Chronic obstructive pulmonary disease, unspecified: Secondary | ICD-10-CM

## 2018-11-29 NOTE — Progress Notes (Signed)
Daily Session Note  Patient Details  Name: Gordon Cameron MRN: [id number] Date of Birth: Dec 13, 1934 Referring Provider:     Pulmonary Rehab from 03/30/2018 in [facility] Cardiac and Pulmonary Rehab  Referring Provider  Ashby Dawes      Encounter Date: 11/29/2018  Check In: Session Check In - 11/29/18 1547      Check-In   Supervising physician immediately available to respond to emergencies  See telemetry face sheet for immediately available ER MD    Location  [facility]-Cardiac & Pulmonary Rehab    Staff Present  Renita Papa, RN BSN;Jeanna Durrell BS, Exercise Physiologist;Kelly Amedeo Plenty, BS, ACSM CEP, Exercise Physiologist    Virtual Visit  No    Medication changes reported      No    Fall or balance concerns reported     No    Warm-up and Cool-down  Performed on first and last piece of equipment    Resistance Training Performed  Yes    VAD Patient?  No    PAD/SET Patient?  No      Pain Assessment   Currently in Pain?  No/denies          Social History   Tobacco Use  Smoking Status Never Smoker  Smokeless Tobacco Never Used    Goals Met:  Independence with exercise equipment Exercise tolerated well No report of cardiac concerns or symptoms Strength training completed today  Goals Unmet:  Not Applicable  Comments: Pt able to follow exercise prescription today without complaint.  Will continue to monitor for progression.    Dr. Emily Filbert is Medical Director for [place] and [facility].

## 2018-11-30 LAB — SARS CORONAVIRUS 2 (TAT 6-24 HRS): SARS Coronavirus 2: NEGATIVE

## 2018-12-01 ENCOUNTER — Other Ambulatory Visit: Payer: Self-pay

## 2018-12-01 ENCOUNTER — Encounter: Payer: Medicare Other | Admitting: *Deleted

## 2018-12-01 DIAGNOSIS — J449 Chronic obstructive pulmonary disease, unspecified: Secondary | ICD-10-CM | POA: Diagnosis not present

## 2018-12-01 NOTE — Progress Notes (Signed)
Daily Session Note  Patient Details  Name: Gordon Cameron MRN: [id number] Date of Birth: 10/01/1934 Referring Provider:     Pulmonary Rehab from 03/30/2018 in [facility] Cardiac and Pulmonary Rehab  Referring Provider  Ramachandran      Encounter Date: 12/01/2018  Check In: Session Check In - 12/01/18 1554      Check-In   Supervising physician immediately available to respond to emergencies  See telemetry face sheet for immediately available ER MD    Location  [facility]-Cardiac & Pulmonary Rehab    Staff Present  Meredith Craven, RN BSN;Jeanna Durrell BS, Exercise Physiologist;Joseph Hood RCP,RRT,BSRT;Melissa Caiola RDN, LDN    Virtual Visit  No    Medication changes reported      No    Fall or balance concerns reported     No    Warm-up and Cool-down  Performed on first and last piece of equipment    Resistance Training Performed  Yes    VAD Patient?  No    PAD/SET Patient?  No      Pain Assessment   Currently in Pain?  No/denies          Social History   Tobacco Use  Smoking Status Never Smoker  Smokeless Tobacco Never Used    Goals Met:  Independence with exercise equipment Exercise tolerated well No report of cardiac concerns or symptoms Strength training completed today  Goals Unmet:  Not Applicable  Comments: Pt able to follow exercise prescription today without complaint.  Will continue to monitor for progression.    Dr. Mark Miller is Medical Director for [facility] and [facility]. 

## 2018-12-02 ENCOUNTER — Encounter: Payer: Medicare Other | Admitting: *Deleted

## 2018-12-02 ENCOUNTER — Ambulatory Visit: Payer: Medicare Other | Attending: Otolaryngology

## 2018-12-02 DIAGNOSIS — J449 Chronic obstructive pulmonary disease, unspecified: Secondary | ICD-10-CM | POA: Diagnosis not present

## 2018-12-02 DIAGNOSIS — G4733 Obstructive sleep apnea (adult) (pediatric): Secondary | ICD-10-CM | POA: Diagnosis not present

## 2018-12-02 DIAGNOSIS — G4739 Other sleep apnea: Secondary | ICD-10-CM | POA: Insufficient documentation

## 2018-12-02 NOTE — Progress Notes (Signed)
Daily Session Note  Patient Details  Name: Gordon Cameron MRN: [id number] Date of Birth: Jul 16, 1934 Referring Provider:     Pulmonary Rehab from 03/30/2018 in [facility] Cardiac and Pulmonary Rehab  Referring Provider  Ashby Dawes      Encounter Date: 12/02/2018  Check In: Session Check In - 12/02/18 1546      Check-In   Supervising physician immediately available to respond to emergencies  See telemetry face sheet for immediately available MD    Location  [facility]-Cardiac & Pulmonary Rehab    Staff Present  Justin Mend Lorre Nick, MA, RCEP, CCRP, CCET;Meredith Sherryll Burger, RN BSN    Virtual Visit  No    Medication changes reported      No    Fall or balance concerns reported     No    Warm-up and Cool-down  Performed on first and last piece of equipment    Resistance Training Performed  Yes    VAD Patient?  No    PAD/SET Patient?  No      Pain Assessment   Currently in Pain?  No/denies    Multiple Pain Sites  No          Social History   Tobacco Use  Smoking Status Never Smoker  Smokeless Tobacco Never Used    Goals Met:  Independence with exercise equipment Exercise tolerated well Personal goals reviewed No report of cardiac concerns or symptoms Strength training completed today  Goals Unmet:  Not Applicable  Comments: Pt able to follow exercise prescription today without complaint.  Will continue to monitor for progression.    Dr. Emily Filbert is Medical Director for [place] and [facility].

## 2018-12-03 ENCOUNTER — Other Ambulatory Visit: Payer: Self-pay

## 2018-12-06 ENCOUNTER — Other Ambulatory Visit: Payer: Self-pay

## 2018-12-06 ENCOUNTER — Encounter: Payer: Medicare Other | Admitting: *Deleted

## 2018-12-06 DIAGNOSIS — J449 Chronic obstructive pulmonary disease, unspecified: Secondary | ICD-10-CM | POA: Diagnosis not present

## 2018-12-06 NOTE — Progress Notes (Signed)
Daily Session Note  Patient Details  Name: Gordon Cameron MRN: [id number] Date of Birth: December 28, 1934 Referring Provider:     Pulmonary Rehab from 03/30/2018 in [facility] Cardiac and Pulmonary Rehab  Referring Provider  Ashby Dawes      Encounter Date: 12/06/2018  Check In: Session Check In - 12/06/18 1556      Check-In   Supervising physician immediately available to respond to emergencies  See telemetry face sheet for immediately available ER MD    Location  [facility]-Cardiac & Pulmonary Rehab    Staff Present  Renita Papa, RN Moises Blood, BS, ACSM CEP, Exercise Physiologist;Joseph Tessie Fass RCP,RRT,BSRT    Virtual Visit  No    Medication changes reported      No    Fall or balance concerns reported     No    Warm-up and Cool-down  Performed on first and last piece of equipment    Resistance Training Performed  Yes    VAD Patient?  No    PAD/SET Patient?  No      Pain Assessment   Currently in Pain?  No/denies          Social History   Tobacco Use  Smoking Status Never Smoker  Smokeless Tobacco Never Used    Goals Met:  Independence with exercise equipment Exercise tolerated well No report of cardiac concerns or symptoms Strength training completed today  Goals Unmet:  Not Applicable  Comments: Pt able to follow exercise prescription today without complaint.  Will continue to monitor for progression.    Dr. Emily Filbert is Medical Director for [place] and [facility].

## 2018-12-08 ENCOUNTER — Other Ambulatory Visit: Payer: Self-pay

## 2018-12-08 ENCOUNTER — Encounter: Payer: Medicare Other | Admitting: *Deleted

## 2018-12-08 DIAGNOSIS — J449 Chronic obstructive pulmonary disease, unspecified: Secondary | ICD-10-CM

## 2018-12-08 NOTE — Progress Notes (Signed)
Daily Session Note  Patient Details  Name: Gordon Cameron MRN: [id number] Date of Birth: 10-Mar-1934 Referring Provider:     Pulmonary Rehab from 03/30/2018 in [facility] Cardiac and Pulmonary Rehab  Referring Provider  Ashby Dawes      Encounter Date: 12/08/2018  Check In: Session Check In - 12/08/18 1558      Check-In   Supervising physician immediately available to respond to emergencies  See telemetry face sheet for immediately available ER MD    Location  [facility]-Cardiac & Pulmonary Rehab    Staff Present  Renita Papa, RN Vickki Hearing, BA, ACSM CEP, Exercise Physiologist;Melissa Caiola RDN, LDN    Virtual Visit  No    Medication changes reported      No    Fall or balance concerns reported     No    Warm-up and Cool-down  Performed on first and last piece of equipment    Resistance Training Performed  Yes    VAD Patient?  No    PAD/SET Patient?  No      Pain Assessment   Currently in Pain?  No/denies          Social History   Tobacco Use  Smoking Status Never Smoker  Smokeless Tobacco Never Used    Goals Met:  Independence with exercise equipment Exercise tolerated well No report of cardiac concerns or symptoms Strength training completed today  Goals Unmet:  Not Applicable  Comments: Pt able to follow exercise prescription today without complaint.  Will continue to monitor for progression.    Dr. Emily Filbert is Medical Director for [place] and [facility].

## 2018-12-09 ENCOUNTER — Encounter: Payer: Medicare Other | Admitting: *Deleted

## 2018-12-09 DIAGNOSIS — J449 Chronic obstructive pulmonary disease, unspecified: Secondary | ICD-10-CM | POA: Diagnosis not present

## 2018-12-09 NOTE — Progress Notes (Signed)
Daily Session Note  Patient Details  Name: TAFT WORTHING MRN: [id number] Date of Birth: [birth year] Referring Provider:     Pulmonary Rehab from 03/30/2018 in [facility] Cardiac and Pulmonary Rehab  Referring Provider  Ashby Dawes      Encounter Date: 12/09/2018  Check In: Session Check In - 12/09/18 1551      Check-In   Supervising physician immediately available to respond to emergencies  See telemetry face sheet for immediately available ER MD    Location  [facility]-Cardiac & Pulmonary Rehab    Staff Present  Renita Papa, RN BSN;Jeanna Durrell BS, Exercise Physiologist    Virtual Visit  No    Medication changes reported      No    Fall or balance concerns reported     No    Warm-up and Cool-down  Performed on first and last piece of equipment    Resistance Training Performed  Yes    VAD Patient?  No    PAD/SET Patient?  No      Pain Assessment   Currently in Pain?  No/denies          Social History   Tobacco Use  Smoking Status Never Smoker  Smokeless Tobacco Never Used    Goals Met:  Independence with exercise equipment Exercise tolerated well No report of cardiac concerns or symptoms Strength training completed today  Goals Unmet:  Not Applicable  Comments: Pt able to follow exercise prescription today without complaint.  Will continue to monitor for progression.    Dr. Emily Filbert is Medical Director for [place] and [facility].

## 2018-12-20 ENCOUNTER — Other Ambulatory Visit: Payer: Self-pay

## 2018-12-20 ENCOUNTER — Encounter: Payer: Medicare Other | Admitting: *Deleted

## 2018-12-20 ENCOUNTER — Telehealth: Payer: Self-pay | Admitting: Internal Medicine

## 2018-12-20 DIAGNOSIS — J449 Chronic obstructive pulmonary disease, unspecified: Secondary | ICD-10-CM | POA: Diagnosis not present

## 2018-12-20 NOTE — Telephone Encounter (Signed)
Called and spoke to pt, who is requesting sleep study results from 12/06/2018. It does not appear that study has been read as of yet.  Dr. Elsworth Soho, can you help with this. Thank you.

## 2018-12-20 NOTE — Progress Notes (Signed)
Daily Session Note  Patient Details  Name: Gordon Cameron MRN: [id number] Date of Birth: [birth year] Referring Provider:     Pulmonary Rehab from 03/30/2018 in [facility] Cardiac and Pulmonary Rehab  Referring Provider  Ashby Dawes      Encounter Date: 12/20/2018  Check In: Session Check In - 12/20/18 1554      Check-In   Supervising physician immediately available to respond to emergencies  See telemetry face sheet for immediately available ER MD    Location  [facility]-Cardiac & Pulmonary Rehab    Staff Present  Renita Papa, RN BSN;Jessica Bowring, MA, RCEP, CCRP, [place], BS, ACSM CEP, Exercise Physiologist;Joseph [place] RCP,RRT,BSRT    Virtual Visit  No    Medication changes reported      No    Fall or balance concerns reported     No    Warm-up and Cool-down  Performed on first and last piece of equipment    Resistance Training Performed  Yes    VAD Patient?  No    PAD/SET Patient?  No      Pain Assessment   Currently in Pain?  No/denies          Social History   Tobacco Use  Smoking Status Never Smoker  Smokeless Tobacco Never Used    Goals Met:  Independence with exercise equipment Exercise tolerated well No report of cardiac concerns or symptoms Strength training completed today  Goals Unmet:  Not Applicable  Comments: Pt able to follow exercise prescription today without complaint.  Will continue to monitor for progression.    Dr. Emily Filbert is Medical Director for [place] and [facility].

## 2018-12-22 ENCOUNTER — Encounter: Payer: Self-pay | Admitting: *Deleted

## 2018-12-22 ENCOUNTER — Other Ambulatory Visit: Payer: Self-pay

## 2018-12-22 ENCOUNTER — Encounter: Payer: Medicare Other | Attending: Internal Medicine | Admitting: *Deleted

## 2018-12-22 DIAGNOSIS — J449 Chronic obstructive pulmonary disease, unspecified: Secondary | ICD-10-CM | POA: Insufficient documentation

## 2018-12-22 NOTE — Progress Notes (Signed)
Daily Session Note  Patient Details  Name: Gordon Cameron MRN: [id number] Date of Birth: Apr 08, 1934 Referring Provider:     Pulmonary Rehab from 03/30/2018 in [facility] Cardiac and Pulmonary Rehab  Referring Provider  Ashby Dawes      Encounter Date: 12/22/2018  Check In: Session Check In - 12/22/18 1554      Check-In   Supervising physician immediately available to respond to emergencies  See telemetry face sheet for immediately available ER MD    Location  [facility]-Cardiac & Pulmonary Rehab    Staff Present  Renita Papa, RN Vickki Hearing, BA, ACSM CEP, Exercise Physiologist;Melissa Caiola RDN, LDN    Virtual Visit  No    Medication changes reported      No    Fall or balance concerns reported     No    Warm-up and Cool-down  Performed on first and last piece of equipment    Resistance Training Performed  Yes    VAD Patient?  No    PAD/SET Patient?  No      Pain Assessment   Currently in Pain?  No/denies          Social History   Tobacco Use  Smoking Status Never Smoker  Smokeless Tobacco Never Used    Goals Met:  Proper associated with RPD/PD & O2 Sat Independence with exercise equipment Using PLB without cueing & demonstrates good technique Exercise tolerated well No report of cardiac concerns or symptoms Strength training completed today  Goals Unmet:  Not Applicable  Comments: Pt able to follow exercise prescription today without complaint.  Will continue to monitor for progression.    Dr. Emily Filbert is Medical Director for [place] and [facility].

## 2018-12-22 NOTE — Progress Notes (Signed)
Pulmonary Individual Treatment Plan  Patient Details  Name: Gordon Cameron MRN: [id number] Date of Birth: 04/26/1934 Referring Provider:     Pulmonary Rehab from 03/30/2018 in [facility]  Referring Provider  Ramachandran      Initial Encounter Date:    Pulmonary Rehab from 03/30/2018 in [facility]  Date  03/30/18      Visit Diagnosis: Chronic obstructive pulmonary disease, unspecified COPD type (HCC)  Patient's Home Medications on Admission:  Current Outpatient Medications:  .  Aclidinium Bromide (TUDORZA PRESSAIR) 400 MCG/ACT AEPB, Inhale 1 puff into the lungs 2 (two) times daily., Disp: 1 each, Rfl: 0 .  Aclidinium Bromide (TUDORZA PRESSAIR) 400 MCG/ACT AEPB, Inhale 1 puff into the lungs daily., Disp: 1 each, Rfl: 0 .  albuterol (VENTOLIN HFA) 108 (90 Base) MCG/ACT inhaler, Inhale 2 puffs into the lungs every 6 (six) hours as needed for wheezing or shortness of breath., Disp: 1 Inhaler, Rfl: 5 .  cetirizine (ZYRTEC) 10 MG tablet, Take 10 mg by mouth daily as needed. , Disp: , Rfl:  .  finasteride (PROSCAR) 5 MG tablet, Once a day, Disp: , Rfl:  .  fluticasone (FLONASE) 50 MCG/ACT nasal spray, Place 2 sprays into both nostrils daily., Disp: , Rfl:  .  furosemide (LASIX) 20 MG tablet, Take 40 mg by mouth daily. , Disp: , Rfl: 2 .  spironolactone (ALDACTONE) 25 MG tablet, Take 25 mg by mouth daily., Disp: , Rfl:   Past Medical History: Past Medical History:  Diagnosis Date  . BPH (benign prostatic hyperplasia)   . Hyperlipidemia   . OSA (obstructive sleep apnea)   . Reactive airway disease   . Restless leg     Tobacco Use: Social History   Tobacco Use  Smoking Status Never Smoker  Smokeless Tobacco Never Used    Labs: Recent Review Flowsheet Data    There is no flowsheet data to display.       Pulmonary Assessment Scores:   UCSD: Self-administered rating of dyspnea associated with activities of daily living  (ADLs) 6-point scale (0 = "not at all" to 5 = "maximal or unable to do because of breathlessness")  Scoring Scores range from 0 to 120.  Minimally important difference is 5 units  CAT: CAT can identify the health impairment of COPD patients and is better correlated with disease progression.  CAT has a scoring range of zero to 40. The CAT score is classified into four groups of low (less than 10), medium (10 - 20), high (21-30) and very high (31-40) based on the impact level of disease on health status. A CAT score over 10 suggests significant symptoms.  A worsening CAT score could be explained by an exacerbation, poor medication adherence, poor inhaler technique, or progression of COPD or comorbid conditions.  CAT MCID is 2 points  mMRC: mMRC (Modified Medical Research Council) Dyspnea Scale is used to assess the degree of baseline functional disability in patients of respiratory disease due to dyspnea. No minimal important difference is established. A decrease in score of 1 point or greater is considered a positive change.   Pulmonary Function Assessment:   Exercise Target Goals: Exercise Program Goal: Individual exercise prescription set using results from initial 6 min walk test and THRR while considering  patient's activity barriers and safety.   Exercise Prescription Goal: Initial exercise prescription builds to 30-45 minutes a day of aerobic activity, 2-3 days per week.  Home exercise guidelines will be given   to patient during program as part of exercise prescription that the participant will acknowledge.  Activity Barriers & Risk Stratification:   6 Minute Walk:  Oxygen Initial Assessment:   Oxygen Re-Evaluation: Oxygen Re-Evaluation    Row Name 07/13/18 1135 08/02/18 1554 09/30/18 1551 11/04/18 1618 12/02/18 1550     Program Oxygen Prescription   Program Oxygen Prescription  None  None  None  None  None     Home Oxygen   Home Oxygen Device  None  None  None  -  None    Sleep Oxygen Prescription  CPAP  CPAP  CPAP  CPAP  CPAP   Liters per minute  0  0  0  0  0   Home Exercise Oxygen Prescription  None  None  None  None  None   Home at Rest Exercise Oxygen Prescription  None  None  None  None  None   Compliance with Home Oxygen Use  Yes  Yes  Yes  Yes  Yes     Goals/Expected Outcomes   Short Term Goals  To learn and demonstrate proper use of respiratory medications;To learn and demonstrate proper pursed lip breathing techniques or other breathing techniques.;To learn and understand importance of maintaining oxygen saturations>88%;To learn and understand importance of monitoring SPO2 with pulse oximeter and demonstrate accurate use of the pulse oximeter.  To learn and demonstrate proper use of respiratory medications;To learn and demonstrate proper pursed lip breathing techniques or other breathing techniques.;To learn and understand importance of maintaining oxygen saturations>88%;To learn and understand importance of monitoring SPO2 with pulse oximeter and demonstrate accurate use of the pulse oximeter.  To learn and exhibit compliance with exercise, home and travel O2 prescription;To learn and understand importance of monitoring SPO2 with pulse oximeter and demonstrate accurate use of the pulse oximeter.;To learn and understand importance of maintaining oxygen saturations>88%;To learn and demonstrate proper pursed lip breathing techniques or other breathing techniques.;To learn and demonstrate proper use of respiratory medications  To learn and exhibit compliance with exercise, home and travel O2 prescription;To learn and understand importance of monitoring SPO2 with pulse oximeter and demonstrate accurate use of the pulse oximeter.;To learn and understand importance of maintaining oxygen saturations>88%;To learn and demonstrate proper pursed lip breathing techniques or other breathing techniques.;To learn and demonstrate proper use of respiratory medications  To learn  and exhibit compliance with exercise, home and travel O2 prescription;To learn and understand importance of monitoring SPO2 with pulse oximeter and demonstrate accurate use of the pulse oximeter.;To learn and understand importance of maintaining oxygen saturations>88%;To learn and demonstrate proper pursed lip breathing techniques or other breathing techniques.;To learn and demonstrate proper use of respiratory medications   Long  Term Goals  Verbalizes importance of monitoring SPO2 with pulse oximeter and return demonstration;Maintenance of O2 saturations>88%;Exhibits proper breathing techniques, such as pursed lip breathing or other method taught during program session;Compliance with respiratory medication;Demonstrates proper use of MDI's  Verbalizes importance of monitoring SPO2 with pulse oximeter and return demonstration;Maintenance of O2 saturations>88%;Exhibits proper breathing techniques, such as pursed lip breathing or other method taught during program session;Compliance with respiratory medication;Demonstrates proper use of MDI's  Exhibits compliance with exercise, home and travel O2 prescription;Verbalizes importance of monitoring SPO2 with pulse oximeter and return demonstration;Maintenance of O2 saturations>88%;Exhibits proper breathing techniques, such as pursed lip breathing or other method taught during program session;Demonstrates proper use of MDI's;Compliance with respiratory medication  Exhibits compliance with exercise, home and travel O2 prescription;Verbalizes importance of monitoring SPO2 with  pulse oximeter and return demonstration;Maintenance of O2 saturations>88%;Exhibits proper breathing techniques, such as pursed lip breathing or other method taught during program session;Demonstrates proper use of MDI's;Compliance with respiratory medication  Exhibits compliance with exercise, home and travel O2 prescription;Verbalizes importance of monitoring SPO2 with pulse oximeter and return  demonstration;Maintenance of O2 saturations>88%;Exhibits proper breathing techniques, such as pursed lip breathing or other method taught during program session;Compliance with respiratory medication;Demonstrates proper use of MDI's   Comments  Gordon Cameron continues to practice PLB and is using his CPAP. He still reports mild SOB, but nothing abnormal for him  Gordon Cameron has been compliant with his CPAP.  He uses his PLB, but his SOB stays about the same.  Patient is wearing his CPAP every night. He takes his albuterol when he needs it. He has yet to get a pulse oximeter. Informed patient where and how to obtain a pulse oximeter.  Gordon Cameron continues to use CPAP every night.  He uses a full face mask currently, but since his surgery it is not fitting as well as it used to.  He will have a new sleep study once he meets his new doctor.  He still has more SOB then he would like but it is getting better.  He is using his PLB to help, but sometimes forgets as it is not habit yet.  He has also started a new inhaler and hopes that it will start to help as well.  He still has not gotten a pulse oximeter yet, but is planning on it.  Gordon Cameron is not happy with his pulmonologist and is not able to get appointmens with whome he needs. He is having a sleep study done tonight at 845. Gordon Cameron has got a pulse oximeter, is checking his oxygen and heart rate daily.   Goals/Expected Outcomes  Short: continue using PLB, increase physical activity. Long: independence with managing SOB  Short: continue using PLB, increase physical activity. Long: independence with managing SOB  Short:obtain a pulse oximeter. Long: monitor oxygen levels on a daily basis.  `  Short: perform sleep study. Long: Let [facility] staff knoiw what results were of sleep study.      Oxygen Discharge (Final Oxygen Re-Evaluation): Oxygen Re-Evaluation - 12/02/18 1550      Program Oxygen Prescription   Program Oxygen Prescription  None      Home Oxygen   Home Oxygen  Device  None    Sleep Oxygen Prescription  CPAP    Liters per minute  0    Home Exercise Oxygen Prescription  None    Home at Rest Exercise Oxygen Prescription  None    Compliance with Home Oxygen Use  Yes      Goals/Expected Outcomes   Short Term Goals  To learn and exhibit compliance with exercise, home and travel O2 prescription;To learn and understand importance of monitoring SPO2 with pulse oximeter and demonstrate accurate use of the pulse oximeter.;To learn and understand importance of maintaining oxygen saturations>88%;To learn and demonstrate proper pursed lip breathing techniques or other breathing techniques.;To learn and demonstrate proper use of respiratory medications    Long  Term Goals  Exhibits compliance with exercise, home and travel O2 prescription;Verbalizes importance of monitoring SPO2 with pulse oximeter and return demonstration;Maintenance of O2 saturations>88%;Exhibits proper breathing techniques, such as pursed lip breathing or other method taught during program session;Compliance with respiratory medication;Demonstrates proper use of MDI's    Comments  Gordon Cameron is not happy with his pulmonologist and is not able to get appointmens  with whome he needs. He is having a sleep study done tonight at 845. Gordon Cameron has got a pulse oximeter, is checking his oxygen and heart rate daily.    Goals/Expected Outcomes  Short: perform sleep study. Long: Let [facility] staff knoiw what results were of sleep study.       Initial Exercise Prescription:   Perform Capillary Blood Glucose checks as needed.  Exercise Prescription Changes: Exercise Prescription Changes    Row Name 09/10/18 0800 10/12/18 1500 11/02/18 1000 11/10/18 1400 11/24/18 1300     Response to Exercise   Blood Pressure (Admit)  98/52  104/64  110/58  112/56  128/64   Blood Pressure (Exercise)  120/70  124/64  118/70  114/56  128/70   Blood Pressure (Exit)  118/70  110/60  118/62  126/70  132/60   Heart Rate (Admit)   78 bpm  75 bpm  86 bpm  80 bpm  88 bpm   Heart Rate (Exercise)  106 bpm  94 bpm  101 bpm  100 bpm  107 bpm   Heart Rate (Exit)  90 bpm  84 bpm  92 bpm  93 bpm  97 bpm   Oxygen Saturation (Admit)  97 %  97 %  96 %  97 %  98 %   Oxygen Saturation (Exercise)  96 %  96 %  96 %  97 %  96 %   Oxygen Saturation (Exit)  97 %  96 %  95 %  98 %  98 %   Rating of Perceived Exertion (Exercise)  12  12  11  12  12    Perceived Dyspnea (Exercise)  2  2  2  2  2    Symptoms  none  none  none  none  none   Duration  Progress to 30 minutes of  aerobic without signs/symptoms of physical distress  Continue with 30 min of aerobic exercise without signs/symptoms of physical distress.  Continue with 30 min of aerobic exercise without signs/symptoms of physical distress.  Continue with 30 min of aerobic exercise without signs/symptoms of physical distress.  Continue with 30 min of aerobic exercise without signs/symptoms of physical distress.   Intensity  THRR unchanged  THRR unchanged  THRR unchanged  THRR unchanged  THRR unchanged     Progression   Progression  Continue to progress workloads to maintain intensity without signs/symptoms of physical distress.  Continue to progress workloads to maintain intensity without signs/symptoms of physical distress.  Continue to progress workloads to maintain intensity without signs/symptoms of physical distress.  Continue to progress workloads to maintain intensity without signs/symptoms of physical distress.  Continue to progress workloads to maintain intensity without signs/symptoms of physical distress.   Average METs  2.3  2.17  2.46  2  2.11     Resistance Training   Training Prescription  Yes  Yes  Yes  Yes  Yes   Weight  3 lbs  3 lbs  3 lbs  3 lb  3 lbs   Reps  10-15  10-15  10-15  10-15  10-15     Interval Training   Interval Training  No  No  No  No  No     Treadmill   MPH  1.7  1.7  -  -  1.8   Grade  0  0  -  -  1   Minutes  15  15  -  -  15   METs  2.3  2.3  -   -  2.63     Recumbant Bike   Level  -  2  3  -  3   Watts  -  -  25  -  -   Minutes  -  15  15  -  15   METs  -  -  2.93  -  -     NuStep   Level  -  -  -  -  2   Minutes  -  -  -  -  15   METs  -  -  -  -  1.7     T5 Nustep   Level  2  2  -  -  -   SPM  80  -  -  -  -   Minutes  15  15  -  -  -   METs  2.2  2.2  -  -  -     Biostep-RELP   Level  -  1  4  4  2    SPM  -  -  -  50  -   Minutes  -  15  15  15  15    METs  -  2  2  2  2      Home Exercise Plan   Plans to continue exercise at  -  Home (comment) walking  Home (comment) walking  Home (comment) walking  Home (comment) walking   Frequency  -  Add 3 additional days to program exercise sessions.  Add 3 additional days to program exercise sessions.  Add 3 additional days to program exercise sessions.  Add 3 additional days to program exercise sessions.   Initial Home Exercises Provided  -  04/14/18  04/14/18  04/14/18  04/14/18   Row Name 12/09/18 1600             Response to Exercise   Blood Pressure (Admit)  102/64       Blood Pressure (Exercise)  132/76       Blood Pressure (Exit)  110/68       Heart Rate (Admit)  76 bpm       Heart Rate (Exercise)  113 bpm       Heart Rate (Exit)  90 bpm       Oxygen Saturation (Admit)  98 %       Oxygen Saturation (Exercise)  86 %       Oxygen Saturation (Exit)  98 %       Rating of Perceived Exertion (Exercise)  13       Perceived Dyspnea (Exercise)  2       Symptoms  none       Duration  Continue with 30 min of aerobic exercise without signs/symptoms of physical distress.       Intensity  THRR unchanged         Progression   Progression  Continue to progress workloads to maintain intensity without signs/symptoms of physical distress.       Average METs  2.26         Resistance Training   Training Prescription  Yes       Weight   3 lb       Reps  10-15         Interval Training   Interval Training  No         Treadmill   MPH  1.9  Grade  1       Minutes   15       METs  2.72         T5 Nustep   Level  3       Minutes  15       METs  1.8         Home Exercise Plan   Plans to continue exercise at  Home (comment) walking       Frequency  Add 3 additional days to program exercise sessions.       Initial Home Exercises Provided  04/14/18          Exercise Comments: Exercise Comments    Row Name 09/09/18 1717           Exercise Comments  Counseled pt on increased weight >3lbs, pt stated he was aware, he went to his PCP and did not take his Lasix as ordered, he also stated he ate before coming to class. Pt is asymptomatic and will resume Lasix as ordered by MD.          Exercise Goals and Review:   Exercise Goals Re-Evaluation : Exercise Goals Re-Evaluation    Row Name 07/13/18 1126 08/02/18 1549 09/10/18 0859 09/30/18 1546 10/12/18 1552     Exercise Goal Re-Evaluation   Exercise Goals Review  Increase Physical Activity;Increase Strength and Stamina;Understanding of Exercise Prescription  Increase Physical Activity;Increase Strength and Stamina;Understanding of Exercise Prescription  Increase Physical Activity;Increase Strength and Stamina;Able to understand and use rate of perceived exertion (RPE) scale;Able to understand and use Dyspnea scale;Knowledge and understanding of Target Heart Rate Range (THRR);Able to check pulse independently;Understanding of Exercise Prescription  Increase Physical Activity;Increase Strength and Stamina;Able to understand and use Dyspnea scale;Understanding of Exercise Prescription  Increase Physical Activity;Increase Strength and Stamina;Understanding of Exercise Prescription   Comments  Gordon Cameron reports continuing to do well at home. He has been slowly increasing activity, but weather has gotten in the way. He stays busy around the house with his wife as company  Gordon Cameron continues to do well at home.  He is walking some.  He feels that he has improved since his first session in rehab and feels that he has  gotten stronger.  He is feeling good overall.  Gordon Cameron has done well since returning to in person sessions.  He works at at [employer].  Staff will monitor progress  Gordon Cameron wants to be able to walk faster and go longer distances. He wants to go up in weights on his strength training. He is only able to eat soft foods and is not eating the best since he needs oral surgery.  Gordon Cameron is doing well in rehab.  His attendance has been lacking recently due to his oral surgery needs.  He wants to keep coming.  He is up to 2 METs on the BioStep.  We will continue to monitor his progress.   Expected Outcomes  Short: add an extra day of intentional exercise. Long: develop exercise routine that is sustainable.  Short:Continue to make time to exercise. Long: Continue to build stamina.  Short - work towards target HR range Long - build stamina  Short: get oral surgery to maintain nutrtion to improve strength and staminia. Long: maintain exercise and increase work loads.  Short: Attend rehab regularly again.  Long: Continue to exericse on his own at home on off days.   [place] Name 11/02/18 1023 11/04/18 1609 11/10/18 1445 11/24/18 1227 12/02/18 1548  Exercise Goal Re-Evaluation   Exercise Goals Review  Increase Physical Activity;Increase Strength and Stamina;Understanding of Exercise Prescription  Increase Physical Activity;Increase Strength and Stamina;Understanding of Exercise Prescription  Increase Physical Activity;Increase Strength and Stamina;Able to understand and use rate of perceived exertion (RPE) scale;Knowledge and understanding of Target Heart Rate Range (THRR);Able to check pulse independently;Understanding of Exercise Prescription  Increase Physical Activity;Increase Strength and Stamina;Understanding of Exercise Prescription  Increase Physical Activity;Increase Strength and Stamina   Comments  Gordon Cameron continues to do well in rehab. He was able to return to his workloads.  We will continue to move up workloads and  monitor his progress.  Gordon Cameron is doing well in rehab.  He has increased some workloads.  He has not been doing his home exercise but is staying active by working around the house.  He is feeling stronger and has some more stamina, he wishes it would recover faster.  We have now added in Mondays for three days a week as well.  Gordon Cameron is not reaching THR range and has been low on RPE at times- staff will encourage him to increase levels to reach these goals  Gordon Cameron is doing well in rehab.  He is enjoying getting back to three days a week.  His sats have been good.  He should be ready to increase workloads.  We will continue to monitor his progress.  Gordon Cameron is getting out and walking at home. He does some work in his garage for exercise. Sometimes he gets his weights out and does a little exercise with them. He gets outside at least every other day.   Expected Outcomes  Short: Increase workloads.  Long: Continue to increase stamina.  Short: Start coming three days a week.  Long: Continue to work on stamina.  Short - work in [employer] range and RPE 11-13 each visit Long - increase overall MET level  -  Short: continue to exercise to improve shortness of breath. Long: maintain exercise to keep shortness of breath at a minimum.   [place] Name 12/09/18 1624             Exercise Goal Re-Evaluation   Exercise Goals Review  Increase Physical Activity;Increase Strength and Stamina;Able to understand and use rate of perceived exertion (RPE) scale;Able to understand and use Dyspnea scale;Able to check pulse independently;Knowledge and understanding of Target Heart Rate Range (THRR);Understanding of Exercise Prescription       Comments  Gordon Cameron has added speed to TM and up to Level 3  on T5.  His oxygen stays above 90 most of the time.       Expected Outcomes  Short - continue to attend consistently Long - maintain exercise on his own          Discharge Exercise Prescription (Final Exercise Prescription Changes): Exercise  Prescription Changes - 12/09/18 1600      Response to Exercise   Blood Pressure (Admit)  102/64    Blood Pressure (Exercise)  132/76    Blood Pressure (Exit)  110/68    Heart Rate (Admit)  76 bpm    Heart Rate (Exercise)  113 bpm    Heart Rate (Exit)  90 bpm    Oxygen Saturation (Admit)  98 %    Oxygen Saturation (Exercise)  86 %    Oxygen Saturation (Exit)  98 %    Rating of Perceived Exertion (Exercise)  13    Perceived Dyspnea (Exercise)  2    Symptoms  none    Duration  Continue with 30 min of aerobic exercise without signs/symptoms of physical distress.    Intensity  THRR unchanged      Progression   Progression  Continue to progress workloads to maintain intensity without signs/symptoms of physical distress.    Average METs  2.26      Resistance Training   Training Prescription  Yes    Weight   3 lb    Reps  10-15      Interval Training   Interval Training  No      Treadmill   MPH  1.9    Grade  1    Minutes  15    METs  2.72      T5 Nustep   Level  3    Minutes  15    METs  1.8      Home Exercise Plan   Plans to continue exercise at  Home (comment)   walking   Frequency  Add 3 additional days to program exercise sessions.    Initial Home Exercises Provided  04/14/18       Nutrition:  Target Goals: Understanding of nutrition guidelines, daily intake of sodium <1587m, cholesterol <2080m calories 30% from fat and 7% or less from saturated fats, daily to have 5 or more servings of fruits and vegetables.  Biometrics:    Nutrition Therapy Plan and Nutrition Goals:   Nutrition Assessments:   Nutrition Goals Re-Evaluation: Nutrition Goals Re-Evaluation    Row Name 11/08/18 1547 11/29/18 1604 12/20/18 1550         Goals   Nutrition Goal  ST:/LT: continue HH eating and current diet plan, manage fluid  ST:/LT: continue HH eating and current diet plan, manage fluid  ST:/LT: continue HH eating and current diet plan, manage fluid     Comment  Can eat  more kinds of foods with new dental surgeries. Still on lasiks and is doing well. No loss of appetite or trouble breathing while eating. Pt reports eating lots of chicken, with some red meat. Pt reports he is constrained to wifes limitations such as vegetables. Pt reports still watching sodium, no table salt and reading labels. Pt wants to continue with his current diet plan.  Pt would like to continue doing what he has been doing. Pt reports he has been retaining fluid - discussed importance of sodium; pt report doing well with monitoring sodium.  Pt would like to continue doing what he has been doing.     Expected Outcome  ST:/LT: continue HH eating and current diet plan, manage fluid  ST:/LT: continue HH eating and current diet plan, manage fluid  ST:/LT: continue HH eating and current diet plan, manage fluid        Nutrition Goals Discharge (Final Nutrition Goals Re-Evaluation): Nutrition Goals Re-Evaluation - 12/20/18 1550      Goals   Nutrition Goal  ST:/LT: continue HH eating and current diet plan, manage fluid    Comment  Pt would like to continue doing what he has been doing.    Expected Outcome  ST:/LT: continue HH eating and current diet plan, manage fluid       Psychosocial: Target Goals: Acknowledge presence or absence of significant depression and/or stress, maximize coping skills, provide positive support system. Participant is able to verbalize types and ability to use techniques and skills needed for reducing stress and depression.   Initial Review & Psychosocial Screening:   Quality of Life Scores:  Scores of 19 and below usually indicate  a poorer quality of life in these areas.  A difference of  2-3 points is a clinically meaningful difference.  A difference of 2-3 points in the total score of the Quality of Life Index has been associated with significant improvement in overall quality of life, self-image, physical symptoms, and general health in studies assessing change in  quality of life.  PHQ-9: Recent Review Flowsheet Data    Depression screen [facility] 2/9 03/30/2018   Decreased Interest 0   Down, Depressed, Hopeless 0   PHQ - 2 Score 0   Altered sleeping 1   Tired, decreased energy 1   Change in appetite 1   Feeling bad or failure about yourself  0   Trouble concentrating 0   Moving slowly or fidgety/restless 0   Suicidal thoughts 0   PHQ-9 Score 3   Difficult doing work/chores Not difficult at all     Interpretation of Total Score  Total Score Depression Severity:  1-4 = Minimal depression, 5-9 = Mild depression, 10-14 = Moderate depression, 15-19 = Moderately severe depression, 20-27 = Severe depression   Psychosocial Evaluation and Intervention:   Psychosocial Re-Evaluation: Psychosocial Re-Evaluation    [place] Name 07/13/18 1133 08/02/18 1550 09/30/18 1556 11/04/18 1615 12/02/18 1553     Psychosocial Re-Evaluation   Current issues with  Current Stress Concerns  Current Stress Concerns  Current Stress Concerns  Current Stress Concerns  Current Sleep Concerns;Current Stress Concerns   Comments  Gordon Cameron reports "feeling good considering the circumstances" and "doing as good as everyone else." He and his wife stay busy around the house which helps him feel good  Gordon Cameron is doing okay.  He tries to stay busy and is able to do some things.  His health continues to be his biggest stressor.  Patient is stressed about his oral surgery that is coming up. He is not able to eat what he wants and has to eat things that he is able to gum. His surgery is next Wednesday on his mouth.  Gordon Cameron is doing well mentally.  He denies any symptoms of depression or anxiety.  He gets frustrated that he can't do as much as he used to do.  He sleeps pretty good and uses his CPAP.  He will have a new pulmonologist and sleep study.  He usually falls asleep in the recliner.  Gordon Cameron is not sleeping as well as he used to. He has a sleep study tonight to see if he needs more pressure and  how his oxygen is doing. Gordon Cameron has a friend who has COVID and he is not doing well. He is worried for his friends outcome.   Expected Outcomes  Short: add extra day of exericse to help with mood. Long: stay upbeat and find a "new normal" during all whats going on with COVID  Short: Exercise regularly for mood boost.  Long: Continue to stay positive.  Short: get his oral surgery. long: maintain exercise prescription to minimize stress.  Short: Meet new pulmonologist.  Long: Continue to stay positive.  Short: obtain sleep study to help with daily sleep. Long: maintain a positive outlook on his friend and his sleep.   Interventions  Encouraged to attend Pulmonary Rehabilitation for the exercise  -  Encouraged to attend Pulmonary Rehabilitation for the exercise  Encouraged to attend Pulmonary Rehabilitation for the exercise  Encouraged to attend Pulmonary Rehabilitation for the exercise   Continue Psychosocial Services   -  -  Follow up required by staff  Follow up required by staff  Follow up required by staff      Psychosocial Discharge (Final Psychosocial Re-Evaluation): Psychosocial Re-Evaluation - 12/02/18 1553      Psychosocial Re-Evaluation   Current issues with  Current Sleep Concerns;Current Stress Concerns    Comments  Gordon Cameron is not sleeping as well as he used to. He has a sleep study tonight to see if he needs more pressure and how his oxygen is doing. Gordon Cameron has a friend who has COVID and he is not doing well. He is worried for his friends outcome.    Expected Outcomes  Short: obtain sleep study to help with daily sleep. Long: maintain a positive outlook on his friend and his sleep.    Interventions  Encouraged to attend Pulmonary Rehabilitation for the exercise    Continue Psychosocial Services   Follow up required by staff       Education: Education Goals: Education classes will be provided on a weekly basis, covering required topics. Participant will state understanding/return  demonstration of topics presented.  Learning Barriers/Preferences:   Education Topics:  Initial Evaluation Education: - Verbal, written and demonstration of respiratory meds, oximetry and breathing techniques. Instruction on use of nebulizers and MDIs and importance of monitoring MDI activations.   Cardiac Rehab from 11/11/2018 in [facility] Cardiac and Pulmonary Rehab  Date  03/30/18  Educator  [facility]  Instruction Review Code  1- Verbalizes Understanding      General Nutrition Guidelines/Fats and Fiber: -Group instruction provided by verbal, written material, models and posters to present the general guidelines for heart healthy nutrition. Gives an explanation and review of dietary fats and fiber.   Cardiac Rehab from 12/09/2018 in [facility] Cardiac and Pulmonary Rehab  Date  12/09/18  Educator  [facility]  Instruction Review Code  1- Verbalizes Understanding      Controlling Sodium/Reading Food Labels: -Group verbal and written material supporting the discussion of sodium use in heart healthy nutrition. Review and explanation with models, verbal and written materials for utilization of the food label.   Exercise Physiology & General Exercise Guidelines: - Group verbal and written instruction with models to review the exercise physiology of the cardiovascular system and associated critical values. Provides general exercise guidelines with specific guidelines to those with heart or lung disease.    Aerobic Exercise & Resistance Training: - Gives group verbal and written instruction on the various components of exercise. Focuses on aerobic and resistive training programs and the benefits of this training and how to safely progress through these programs.   Cardiac Rehab from 12/09/2018 in [facility] Cardiac and Pulmonary Rehab  Date  11/25/18  Educator  [facility]  Instruction Review Code  1- Verbalizes Understanding      Flexibility, Balance, Mind/Body Relaxation: Provides group verbal/written instruction on  the benefits of flexibility and balance training, including mind/body exercise modes such as yoga, pilates and tai chi.  Demonstration and skill practice provided.   Cardiac Rehab from 12/09/2018 in [facility] Cardiac and Pulmonary Rehab  Date  12/09/18  Educator  [facility]  Instruction Review Code  1- Verbalizes Understanding      Stress and Anxiety: - Provides group verbal and written instruction about the health risks of elevated stress and causes of high stress.  Discuss the correlation between heart/lung disease and anxiety and treatment options. Review healthy ways to manage with stress and anxiety.   Depression: - Provides group verbal and written instruction on the correlation between heart/lung disease and depressed mood, treatment options, and  the stigmas associated with seeking treatment.   Exercise & Equipment Safety: - Individual verbal instruction and demonstration of equipment use and safety with use of the equipment.   Cardiac Rehab from 11/11/2018 in [facility] Cardiac and Pulmonary Rehab  Date  03/30/18  Educator  [facility]  Instruction Review Code  1- Verbalizes Understanding      Infection Prevention: - Provides verbal and written material to individual with discussion of infection control including proper hand washing and proper equipment cleaning during exercise session.   Cardiac Rehab from 11/11/2018 in [facility] Cardiac and Pulmonary Rehab  Date  03/30/18  Educator  [facility]  Instruction Review Code  1- Verbalizes Understanding      Falls Prevention: - Provides verbal and written material to individual with discussion of falls prevention and safety.   Cardiac Rehab from 11/11/2018 in [facility] Cardiac and Pulmonary Rehab  Date  03/30/18  Educator  [facility]  Instruction Review Code  1- Verbalizes Understanding      Diabetes: - Individual verbal and written instruction to review signs/symptoms of diabetes, desired ranges of glucose level fasting, after meals and with exercise. Advice that pre and  post exercise glucose checks will be done for 3 sessions at entry of program.   Chronic Lung Diseases: - Group verbal and written instruction to review updates, respiratory medications, advancements in procedures and treatments. Discuss use of supplemental oxygen including available portable oxygen systems, continuous and intermittent flow rates, concentrators, personal use and safety guidelines. Review proper use of inhaler and spacers. Provide informative websites for self-education.    Energy Conservation: - Provide group verbal and written instruction for methods to conserve energy, plan and organize activities. Instruct on pacing techniques, use of adaptive equipment and posture/positioning to relieve shortness of breath.   Triggers and Exacerbations: - Group verbal and written instruction to review types of environmental triggers and ways to prevent exacerbations. Discuss weather changes, air quality and the benefits of nasal washing. Review warning signs and symptoms to help prevent infections. Discuss techniques for effective airway clearance, coughing, and vibrations.   AED/CPR: - Group verbal and written instruction with the use of models to demonstrate the basic use of the AED with the basic ABC's of resuscitation.   Anatomy and Physiology of the Lungs: - Group verbal and written instruction with the use of models to provide basic lung anatomy and physiology related to function, structure and complications of lung disease.   Anatomy & Physiology of the Heart: - Group verbal and written instruction and models provide basic cardiac anatomy and physiology, with the coronary electrical and arterial systems. Review of Valvular disease and Heart Failure   Cardiac Rehab from 12/09/2018 in [facility] Cardiac and Pulmonary Rehab  Date  11/25/18  Educator  [facility]  Instruction Review Code  1- Verbalizes Understanding      Cardiac Medications: - Group verbal and written instruction to review  commonly prescribed medications for heart disease. Reviews the medication, class of the drug, and side effects.   Know Your Numbers and Risk Factors: -Group verbal and written instruction about important numbers in your health.  Discussion of what are risk factors and how they play a role in the disease process.  Review of Cholesterol, Blood Pressure, Diabetes, and BMI and the role they play in your overall health.   Cardiac Rehab from 11/11/2018 in [facility] Cardiac and Pulmonary Rehab  Date  11/11/18  Educator  [facility]  Instruction Review Code  1- Verbalizes Understanding      Sleep  Hygiene: -Provides group verbal and written instruction about how sleep can affect your health.  Define sleep hygiene, discuss sleep cycles and impact of sleep habits. Review good sleep hygiene tips.    Other: -Provides group and verbal instruction on various topics (see comments)    Knowledge Questionnaire Score:    Core Components/Risk Factors/Patient Goals at Admission:   Core Components/Risk Factors/Patient Goals Review:  Goals and Risk Factor Review    Row Name 07/13/18 1129 08/02/18 1552 09/30/18 1559 11/04/18 1617 12/02/18 1557     Core Components/Risk Factors/Patient Goals Review   Personal Goals Review  Weight Management/Obesity;Hypertension;Improve shortness of breath with ADL's  Weight Management/Obesity;Hypertension;Improve shortness of breath with ADL's  Weight Management/Obesity;Improve shortness of breath with ADL's  Weight Management/Obesity;Improve shortness of breath with ADL's  Weight Management/Obesity;Improve shortness of breath with ADL's;Hypertension   Review  Gordon Cameron reports no issues at home managing his risk factors. His weight loss is going well. His SOB is doing okay, he said the weather hasnt been helping.Gordon Cameron has been struggling with his weight some and attributes it to increased fluid.  He is now on 40 mg of Lasix 3x a day.  It seems to help most days, but some days he still  feels heavy.  He checks his pressures at least twice a day and they have been staying pretty good.  He does get a few reading in the 90s but he denies any symptoms with these.  He is taking lasix for the fluid around his heart. His primary care is monitoring his lasix intake. Gordon Cameron has lost weight since mark and has reached his goal of weighing 190 pounds. He weighed in today at 188.5 pounds.  Khoury is staying around 190 lbs for his weight still.  He will go up and down.  He can eat better down after his oral surgery.  His breathing is getting better.  Patients weight has gone up a little bit since the start of the program. He thinks that it is more water retention. His doctor is aware of his fluid retention and has since then increased his lasix.   Expected Outcomes  Short: continue to work on weight loss. Long: independence in management of risk factors.  Short: Continue to work on weight loss.  Long: Continue to monitor risk factors.  Short: improve on ADL. Long: be able to maintain ADL's independently.  Short: Continue to monitor weight.  Long; Continue to monitor risk factors.  Short: continue watching fluid intake to help with shortness of breath. Long: Lose weight.      Core Components/Risk Factors/Patient Goals at Discharge (Final Review):  Goals and Risk Factor Review - 12/02/18 1557      Core Components/Risk Factors/Patient Goals Review   Personal Goals Review  Weight Management/Obesity;Improve shortness of breath with ADL's;Hypertension    Review  Patients weight has gone up a little bit since the start of the program. He thinks that it is more water retention. His doctor is aware of his fluid retention and has since then increased his lasix.    Expected Outcomes  Short: continue watching fluid intake to help with shortness of breath. Long: Lose weight.       ITP Comments: ITP Comments    Row Name 08/19/18 1150 09/01/18 0553 09/23/18 1025 09/28/18 0913 09/29/18 0554   ITP Comments   Tarence has been doing the same across the board.  He said that he has not noticed any big changes.  He is now scheduled  to return on 8/10 at 1045.  30 Day Review Completed today. Continue with ITP unless changed by Medical Director review.  Kristof is out this week for a dental procedure.  Unable to complete nutrition 30-day re-eval cycle due to inconsistent attendence  30 Day review. Continue with ITP unless directed changes per Medical Director review.   [place] Name 10/27/18 1256 11/24/18 0619 12/22/18 0933       ITP Comments  30 day review completed. ITP sent to Dr. Emily Filbert, Medical Director of Cardiac and Pulmonary Rehab. Continue with ITP unless changes are made by physician.  Department closed starting 10/2 until further notice by infection prevention and Health at Work teams for COVID-19.  30 day review completed. Continue with ITP sent to Dr. Emily Filbert, Medical Director of Cardiac and Pulmonary Rehab for review , changes as needed and signature.  30 day review competed . ITP sent to Dr Emily Filbert for review, changes as needed and ITP approval signature.        Comments:

## 2018-12-23 ENCOUNTER — Encounter: Payer: Self-pay | Admitting: Internal Medicine

## 2018-12-23 ENCOUNTER — Encounter: Payer: Medicare Other | Admitting: *Deleted

## 2018-12-23 ENCOUNTER — Ambulatory Visit (INDEPENDENT_AMBULATORY_CARE_PROVIDER_SITE_OTHER): Payer: Medicare Other | Admitting: Internal Medicine

## 2018-12-23 VITALS — Ht 68.0 in | Wt 196.8 lb

## 2018-12-23 DIAGNOSIS — J449 Chronic obstructive pulmonary disease, unspecified: Secondary | ICD-10-CM

## 2018-12-23 DIAGNOSIS — G4733 Obstructive sleep apnea (adult) (pediatric): Secondary | ICD-10-CM

## 2018-12-23 NOTE — Telephone Encounter (Signed)
His titration study showed persistent central apneas in spite of higher pressure and CPAP and use of BiPAP up to pressures of 14/10 If we want to fix this, he likely needs a different kind of machine called ASV Suggest schedule appointment with sleep provider at [facility] or here to discuss further

## 2018-12-23 NOTE — Telephone Encounter (Signed)
Spoke to pt and relayed below message. Pt would like to review sleep study prior to making a decision.  It does not appear that sleep study has been scanned into pt's chart as of yet.   Triage, can you guys possibly fax me the report? Thanks

## 2018-12-23 NOTE — Patient Instructions (Signed)
Albuterol as needed Mask fitting referral

## 2018-12-23 NOTE — Telephone Encounter (Signed)
Just read  this morning and has to come via sleep med into epic

## 2018-12-23 NOTE — Progress Notes (Signed)
* [facility]    I connected with the patient by telephone enabled telemedicine visit and verified that I am speaking with the correct person using two identifiers.    I discussed the limitations, risks, security and privacy concerns of performing an evaluation and management service by telemedicine and the availability of in-person appointments. I also discussed with the patient that there may be a patient responsible charge related to this service. The patient expressed understanding and agreed to proceed.  PATIENT AGREES AND CONFIRMS -YES   Other persons participating in the visit and their role in the encounter: Patient, nursing  This visit type was conducted due to national recommendations for restrictions regarding the COVID-19 Pandemic (e.g. social distancing).  This format is felt to be most appropriate for this patient at this time.  All issues noted in this document were discussed and addressed.         Date: 12/23/2018  MRN# [id number] Gordon Cameron 12-31-1934   SYNOPSIS Gordon Cameron is a 84 y.o. male with obstructive sleep apnea, and asthma, positive ANA.  Patient was previously being seen up in [place] but is switching his care down to [place].  Gordon Cameron last visit he was asked to continue using Advair, CPAP.  He was given a course of nystatin for oral thrush.  He was initially being followed for OSA, but last year his breathing declined and he was thought to have possible asthma. He feels that his breathing is progressively worsening slowly over the past year and really over the past few weeks.  He was initially on advair for about 2 months and was not helping and he lost his voice which came back when he stopped it after some dental work. He has albuterol that he uses rarely. His breathing is worse when he bends over.  He has gained about 15 pounds, and was having leg swelling. He was then put back on lasix.  He has not gone to cardiac rehab or  pulm rehab.  He sees [facility] Cardiology, EF of 50% with percardial effusion.   He has never smoked, his father was a heavy smoker. 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   Greater than 50% of the 40 minute visit was spent in counseling/coordination of care regarding dyspnea.    **CT chest 06/23/17>>images personally review, normal lungs, mild pericardial effusion.  **CBC 06/23/17>> Abs Eos 800.  **PFT 07/03/2017 tracings personally reviewed, FVC is 61% predicted, FEV1 is 57% predicted, there is no significant improvement with bronchodilator.  Ratio is 66%.  Flow volume loop is unremarkable.  TLC is 73% predicted, ratio is 109%.  DLCO is reduced at 41%. Overall this test shows moderate obstructive lung disease with FEV1 of 57%.  There is also evidence of restriction with reduced FVC.   CC Follow up COPD Follow OSA  HPI COPD stable at this time No  COPD exacerbation at this time No evidence of heart failure at this time No evidence or signs of infection at this time No respiratory distress No fevers, chills, nausea, vomiting, diarrhea No evidence of lower extremity edema No evidence hemoptysis On ALBUTEROL AS NEEDED  OSA seems to under control with CPAP therapy   Medication:    Current Outpatient Medications:  .  Aclidinium Bromide (TUDORZA PRESSAIR) 400 MCG/ACT AEPB, Inhale 1 puff into the lungs 2 (two) times daily., Disp: 1 each, Rfl: 0 .  Aclidinium Bromide (TUDORZA PRESSAIR) 400 MCG/ACT AEPB, Inhale 1 puff  into the lungs daily., Disp: 1 each, Rfl: 0 .  albuterol (VENTOLIN HFA) 108 (90 Base) MCG/ACT inhaler, Inhale 2 puffs into the lungs every 6 (six) hours as needed for wheezing or shortness of breath., Disp: 1 Inhaler, Rfl: 5 .  cetirizine (ZYRTEC) 10 MG tablet, Take 10 mg by mouth daily as needed. , Disp: , Rfl:  .  finasteride (PROSCAR) 5 MG tablet, Once a day, Disp: , Rfl:  .  fluticasone (FLONASE) 50 MCG/ACT nasal spray, Place 2 sprays into both nostrils  daily., Disp: , Rfl:  .  furosemide (LASIX) 20 MG tablet, Take 40 mg by mouth daily. , Disp: , Rfl: 2 .  spironolactone (ALDACTONE) 25 MG tablet, Take 25 mg by mouth daily., Disp: , Rfl:    Allergies:  Patient has no known allergies.   Assessment and Plan:  Dyspnea on exertion From COPD, CHF and deconditioned state  - Patient is experiencing a progressive decline in functional status, which I suspect is multifactorial due to cardiac, pulmonary disease, deconditioning/debility.  Patient is at high risk of exacerbations/complications due to declining respiratory status. Still active in PULM rehab  COPD/chronic bronchitis. Only uses albuterol as needed No need for steroids or ABX at this time  Obstructive sleep apnea. Mask fitting referral needed   COVID-19 EDUCATION: The signs and symptoms of COVID-19 were discussed with the patient and how to seek care for testing.  The importance of social distancing was discussed today. Hand Washing Techniques and avoid touching face was advised.     MEDICATION ADJUSTMENTS/LABS AND TESTS ORDERED: Albuterol as needed Mask fitting referral   CURRENT MEDICATIONS REVIEWED AT LENGTH WITH PATIENT TODAY   Patient satisfied with Plan of action and management. All questions answered  Follow up in 6 months Total Time Spent 22 mins  Maretta Bees Patricia Pesa, M.D.  [facility]  Medical Director [place] Director [facility] Cardio-Pulmonary Department

## 2018-12-23 NOTE — Telephone Encounter (Signed)
Will wait until this gets scanned in

## 2018-12-23 NOTE — Progress Notes (Signed)
Daily Session Note  Patient Details  Name: Gordon Cameron MRN: [id number] Date of Birth: Jul 02, 1934 Referring Provider:     Pulmonary Rehab from 03/30/2018 in [facility] Cardiac and Pulmonary Rehab  Referring Provider  Ashby Dawes      Encounter Date: 12/23/2018  Check In: Session Check In - 12/23/18 1540      Check-In   Supervising physician immediately available to respond to emergencies  See telemetry face sheet for immediately available ER MD    Location  [facility]-Cardiac & Pulmonary Rehab    Staff Present  Renita Papa, RN BSN;Jessica Luan Pulling, MA, RCEP, CCRP, CCET;Jeanna Durrell BS, Exercise Physiologist    Virtual Visit  No    Medication changes reported      No    Warm-up and Cool-down  Performed on first and last piece of equipment    Resistance Training Performed  Yes    VAD Patient?  No    PAD/SET Patient?  No      Pain Assessment   Currently in Pain?  No/denies          Social History   Tobacco Use  Smoking Status Never Smoker  Smokeless Tobacco Never Used    Goals Met:  Proper associated with RPD/PD & O2 Sat Independence with exercise equipment Using PLB without cueing & demonstrates good technique Exercise tolerated well No report of cardiac concerns or symptoms Strength training completed today  Goals Unmet:  Not Applicable  Comments: Pt able to follow exercise prescription today without complaint.  Will continue to monitor for progression.  Banks Name 12/23/18 1602         6 Minute Walk   Phase  Discharge     Distance  1360 feet     Distance % Change  7 %     Distance Feet Change  90 ft     Walk Time  6 minutes     # of Rest Breaks  0     MPH  2.58     METS  2.82     RPE  13     Perceived Dyspnea   2     VO2 Peak  9.87     Symptoms  Yes (comment)     Comments  SOB     Resting HR  87 bpm     Resting BP  126/62     Resting Oxygen Saturation   97 %     Exercise Oxygen Saturation  during 6 min walk  94 %     Max Ex. HR   119 bpm     Max Ex. BP  172/74     2 Minute Post BP  136/70       Interval HR   1 Minute HR  103     2 Minute HR  112     3 Minute HR  116     4 Minute HR  116     5 Minute HR  117     6 Minute HR  119     2 Minute Post HR  94     Interval Heart Rate?  Yes       Interval Oxygen   Interval Oxygen?  Yes     Baseline Oxygen Saturation %  97 %     1 Minute Oxygen Saturation %  95 %     1 Minute Liters of Oxygen  0 L     2  Minute Oxygen Saturation %  96 %     2 Minute Liters of Oxygen  0 L     3 Minute Oxygen Saturation %  95 %     3 Minute Liters of Oxygen  0 L     4 Minute Oxygen Saturation %  94 %     4 Minute Liters of Oxygen  0 L     5 Minute Oxygen Saturation %  94 %     5 Minute Liters of Oxygen  0 L     6 Minute Oxygen Saturation %  94 %     6 Minute Liters of Oxygen  0 L     2 Minute Post Oxygen Saturation %  95 %     2 Minute Post Liters of Oxygen  0 L         Dr. Emily Filbert is Medical Director for [place] and [facility].

## 2018-12-23 NOTE — Telephone Encounter (Signed)
Pt had phone visit with Dr. Mortimer Fries today, and mask fitting was ordered.  Will hold off on this referral under pt has made decision on machine. Pt is agreeable with this plan.

## 2018-12-23 NOTE — Telephone Encounter (Signed)
Hinton Dyer, do you by chance have a copy of this or Dr Elsworth Soho? Pt wants to review and it's not scanned in yet. Thanks

## 2018-12-27 ENCOUNTER — Encounter: Payer: Medicare Other | Admitting: *Deleted

## 2018-12-27 ENCOUNTER — Other Ambulatory Visit: Payer: Self-pay

## 2018-12-27 DIAGNOSIS — J449 Chronic obstructive pulmonary disease, unspecified: Secondary | ICD-10-CM

## 2018-12-27 NOTE — Telephone Encounter (Signed)
Sleep study has not been uploaded to epic as of yet.  Will continue to hold.

## 2018-12-27 NOTE — Progress Notes (Signed)
Daily Session Note  Patient Details  Name: Gordon Cameron MRN: [id number] Date of Birth: 02-27-1934 Referring Provider:     Pulmonary Rehab from 03/30/2018 in [facility] Cardiac and Pulmonary Rehab  Referring Provider  Ashby Dawes      Encounter Date: 12/27/2018  Check In: Session Check In - 12/27/18 1558      Check-In   Supervising physician immediately available to respond to emergencies  See telemetry face sheet for immediately available ER MD    Location  [facility]-Cardiac & Pulmonary Rehab    Staff Present  Renita Papa, RN Moises Blood, BS, ACSM CEP, Exercise Physiologist;Joseph Tessie Fass RCP,RRT,BSRT    Virtual Visit  No    Medication changes reported      No    Fall or balance concerns reported     No    Warm-up and Cool-down  Performed on first and last piece of equipment    Resistance Training Performed  Yes    VAD Patient?  No    PAD/SET Patient?  No      Pain Assessment   Currently in Pain?  No/denies          Social History   Tobacco Use  Smoking Status Never Smoker  Smokeless Tobacco Never Used    Goals Met:  Independence with exercise equipment Exercise tolerated well No report of cardiac concerns or symptoms Strength training completed today  Goals Unmet:  Not Applicable  Comments: Pt able to follow exercise prescription today without complaint.  Will continue to monitor for progression.    Dr. Emily Filbert is Medical Director for [place] and [facility].

## 2018-12-29 ENCOUNTER — Other Ambulatory Visit: Payer: Self-pay

## 2018-12-29 ENCOUNTER — Encounter: Payer: Medicare Other | Admitting: *Deleted

## 2018-12-29 DIAGNOSIS — J449 Chronic obstructive pulmonary disease, unspecified: Secondary | ICD-10-CM | POA: Diagnosis not present

## 2018-12-29 NOTE — Progress Notes (Signed)
Daily Session Note  Patient Details  Name: Gordon Cameron MRN: [id number] Date of Birth: 12/16/1934 Referring Provider:     Pulmonary Rehab from 03/30/2018 in [facility] Cardiac and Pulmonary Rehab  Referring Provider  Ramachandran      Encounter Date: 12/29/2018  Check In: Session Check In - 12/29/18 1541      Check-In   Supervising physician immediately available to respond to emergencies  See telemetry face sheet for immediately available ER MD    Location  [facility]-Cardiac & Pulmonary Rehab    Staff Present  Meredith Craven, RN BSN;Amanda Sommer, BA, ACSM CEP, Exercise Physiologist;Melissa Caiola RDN, LDN    Virtual Visit  No    Medication changes reported      No    Fall or balance concerns reported     No    Warm-up and Cool-down  Performed on first and last piece of equipment    Resistance Training Performed  Yes    VAD Patient?  No    PAD/SET Patient?  No      Pain Assessment   Currently in Pain?  No/denies          Social History   Tobacco Use  Smoking Status Never Smoker  Smokeless Tobacco Never Used    Goals Met:  Independence with exercise equipment Exercise tolerated well No report of cardiac concerns or symptoms Strength training completed today  Goals Unmet:  Not Applicable  Comments: Pt able to follow exercise prescription today without complaint.  Will continue to monitor for progression.    Dr. Mark Miller is Medical Director for [facility] and [facility]. 

## 2018-12-30 ENCOUNTER — Telehealth: Payer: Self-pay | Admitting: Internal Medicine

## 2018-12-30 ENCOUNTER — Encounter: Payer: Medicare Other | Admitting: *Deleted

## 2018-12-30 DIAGNOSIS — J449 Chronic obstructive pulmonary disease, unspecified: Secondary | ICD-10-CM | POA: Diagnosis not present

## 2018-12-30 NOTE — Progress Notes (Signed)
Daily Session Note  Patient Details  Name: Gordon Cameron MRN: [id number] Date of Birth: [birth year] Referring Provider:     Pulmonary Rehab from 03/30/2018 in [facility] Cardiac and Pulmonary Rehab  Referring Provider  Ashby Dawes      Encounter Date: 12/30/2018  Check In: Session Check In - 12/30/18 1537      Check-In   Supervising physician immediately available to respond to emergencies  See telemetry face sheet for immediately available ER MD    Location  [facility]-Cardiac & Pulmonary Rehab    Staff Present  Renita Papa, RN BSN;Jessica Luan Pulling, MA, RCEP, CCRP, CCET;Joseph Alameda RCP,RRT,BSRT    Virtual Visit  No    Medication changes reported      No    Fall or balance concerns reported     No    Warm-up and Cool-down  Performed on first and last piece of equipment    Resistance Training Performed  Yes    VAD Patient?  No    PAD/SET Patient?  No      Pain Assessment   Currently in Pain?  No/denies          Social History   Tobacco Use  Smoking Status Never Smoker  Smokeless Tobacco Never Used    Goals Met:  Proper associated with RPD/PD & O2 Sat Independence with exercise equipment Using PLB without cueing & demonstrates good technique Exercise tolerated well No report of cardiac concerns or symptoms Strength training completed today  Goals Unmet:  Not Applicable  Comments: Pt able to follow exercise prescription today without complaint.  Will continue to monitor for progression.    Dr. Emily Filbert is Medical Director for [place] and [facility].

## 2018-12-30 NOTE — Telephone Encounter (Signed)
Please see 12/20/2018 phone note.

## 2018-12-30 NOTE — Telephone Encounter (Signed)
Sleep study has not yet been uploaded to epic.  I have reached out to [place] with [facility], and requested that sleep study be faxed to our office.  I have made pt aware of this information. Pt requested that copy of sleep study be mailed to address on file- verified with pt.

## 2018-12-30 NOTE — Patient Instructions (Signed)
Discharge Patient Instructions  Patient Details  Name: Gordon Cameron MRN: [id number] Date of Birth: 12/24/1934 Referring Provider:  Kirk Ruths, MD   Number of Visits:  36  Reason for Discharge:  Patient reached a stable level of exercise. Patient independent in their exercise. Patient has met program and personal goals.  Smoking History:  Social History   Tobacco Use  Smoking Status Never Smoker  Smokeless Tobacco Never Used    Diagnosis:  Chronic obstructive pulmonary disease, unspecified COPD type ([place])  Initial Exercise Prescription:   Discharge Exercise Prescription (Final Exercise Prescription Changes): Exercise Prescription Changes - 12/23/18 0900      Response to Exercise   Blood Pressure (Admit)  144/86    Blood Pressure (Exercise)  136/64    Blood Pressure (Exit)  110/68    Heart Rate (Admit)  80 bpm    Heart Rate (Exercise)  99 bpm    Heart Rate (Exit)  92 bpm    Oxygen Saturation (Admit)  97 %    Oxygen Saturation (Exercise)  95 %    Oxygen Saturation (Exit)  99 %    Rating of Perceived Exertion (Exercise)  12    Perceived Dyspnea (Exercise)  2    Symptoms  none    Duration  Continue with 30 min of aerobic exercise without signs/symptoms of physical distress.    Intensity  THRR unchanged      Progression   Progression  Continue to progress workloads to maintain intensity without signs/symptoms of physical distress.    Average METs  2.46      Resistance Training   Training Prescription  Yes    Weight  4 lb    Reps  10-15      Interval Training   Interval Training  No      Treadmill   MPH  1.9    Grade  1    Minutes  15    METs  2.72      Recumbant Bike   Level  3    Watts  22    Minutes  15    METs  2.67      REL-XR   Level  1    Minutes  15    METs  2.4      T5 Nustep   Level  3    Minutes  15      Biostep-RELP   Level  2    Minutes  15    METs  2      Home Exercise Plan   Plans to continue exercise at   Home (comment)   walking   Frequency  Add 3 additional days to program exercise sessions.    Initial Home Exercises Provided  04/14/18       Functional Capacity: 6 Minute Walk    Row Name 12/23/18 1602         6 Minute Walk   Phase  Discharge     Distance  1360 feet     Distance % Change  7 %     Distance Feet Change  90 ft     Walk Time  6 minutes     # of Rest Breaks  0     MPH  2.58     METS  2.82     RPE  13     Perceived Dyspnea   2     VO2 Peak  9.87     Symptoms  Yes (  comment)     Comments  SOB     Resting HR  87 bpm     Resting BP  126/62     Resting Oxygen Saturation   97 %     Exercise Oxygen Saturation  during 6 min walk  94 %     Max Ex. HR  119 bpm     Max Ex. BP  172/74     2 Minute Post BP  136/70       Interval HR   1 Minute HR  103     2 Minute HR  112     3 Minute HR  116     4 Minute HR  116     5 Minute HR  117     6 Minute HR  119     2 Minute Post HR  94     Interval Heart Rate?  Yes       Interval Oxygen   Interval Oxygen?  Yes     Baseline Oxygen Saturation %  97 %     1 Minute Oxygen Saturation %  95 %     1 Minute Liters of Oxygen  0 L     2 Minute Oxygen Saturation %  96 %     2 Minute Liters of Oxygen  0 L     3 Minute Oxygen Saturation %  95 %     3 Minute Liters of Oxygen  0 L     4 Minute Oxygen Saturation %  94 %     4 Minute Liters of Oxygen  0 L     5 Minute Oxygen Saturation %  94 %     5 Minute Liters of Oxygen  0 L     6 Minute Oxygen Saturation %  94 %     6 Minute Liters of Oxygen  0 L     2 Minute Post Oxygen Saturation %  95 %     2 Minute Post Liters of Oxygen  0 L        Quality of Life:   Personal Goals: Goals established at orientation with interventions provided to work toward goal.    Personal Goals Discharge: Goals and Risk Factor Review - 12/02/18 1557      Core Components/Risk Factors/Patient Goals Review   Personal Goals Review  Weight Management/Obesity;Improve shortness of breath with  ADL's;Hypertension    Review  Patients weight has gone up a little bit since the start of the program. He thinks that it is more water retention. His doctor is aware of his fluid retention and has since then increased his lasix.    Expected Outcomes  Short: continue watching fluid intake to help with shortness of breath. Long: Lose weight.       Exercise Goals and Review:   Exercise Goals Re-Evaluation: Exercise Goals Re-Evaluation    Row Name 07/13/18 1126 08/02/18 1549 09/10/18 0859 09/30/18 1546 10/12/18 1552     Exercise Goal Re-Evaluation   Exercise Goals Review  Increase Physical Activity;Increase Strength and Stamina;Understanding of Exercise Prescription  Increase Physical Activity;Increase Strength and Stamina;Understanding of Exercise Prescription  Increase Physical Activity;Increase Strength and Stamina;Able to understand and use rate of perceived exertion (RPE) scale;Able to understand and use Dyspnea scale;Knowledge and understanding of Target Heart Rate Range (THRR);Able to check pulse independently;Understanding of Exercise Prescription  Increase Physical Activity;Increase Strength and Stamina;Able to understand and use Dyspnea scale;Understanding of Exercise Prescription  Increase  Physical Activity;Increase Strength and Stamina;Understanding of Exercise Prescription   Comments  Valeriano reports continuing to do well at home. He has been slowly increasing activity, but weather has gotten in the way. He stays busy around the house with his wife as company  Vernard continues to do well at home.  He is walking some.  He feels that he has improved since his first session in rehab and feels that he has gotten stronger.  He is feeling good overall.  Rilyn has done well since returning to in person sessions.  He works at at [employer].  Staff will monitor progress  Jhalen wants to be able to walk faster and go longer distances. He wants to go up in weights on his strength training. He is only able  to eat soft foods and is not eating the best since he needs oral surgery.  Rosalie is doing well in rehab.  His attendance has been lacking recently due to his oral surgery needs.  He wants to keep coming.  He is up to 2 METs on the BioStep.  We will continue to monitor his progress.   Expected Outcomes  Short: add an extra day of intentional exercise. Long: develop exercise routine that is sustainable.  Short:Continue to make time to exercise. Long: Continue to build stamina.  Short - work towards target HR range Long - build stamina  Short: get oral surgery to maintain nutrtion to improve strength and staminia. Long: maintain exercise and increase work loads.  Short: Attend rehab regularly again.  Long: Continue to exericse on his own at home on off days.   M[place]err[place] Name 11/02/18 1023 11/04/18 1609 11/10/18 1445 11/24/18 1227 12/02/18 1548     Exercise Goal Re-Evaluation   Exercise Goals Review  Increase Physical Activity;Increase Strength and Stamina;Understanding of Exercise Prescription  Increase Physical Activity;Increase Strength and Stamina;Understanding of Exercise Prescription  Increase Physical Activity;Increase Strength and Stamina;Able to understand and use rate of perceived exertion (RPE) scale;Knowledge and understanding of Target Heart Rate Range (THRR);Able to check pulse independently;Understanding of Exercise Prescription  Increase Physical Activity;Increase Strength and Stamina;Understanding of Exercise Prescription  Increase Physical Activity;Increase Strength and Stamina   Comments  Bernal continues to do well in rehab. He was able to return to his workloads.  We will continue to move up workloads and monitor his progress.  Kule is doing well in rehab.  He has increased some workloads.  He has not been doing his home exercise but is staying active by working around the house.  He is feeling stronger and has some more stamina, he wishes it would recover faster.  We have now added in Mondays  for three days a week as well.  Trajon is not reaching THR range and has been low on RPE at times- staff will encourage him to increase levels to reach these goals  Jaxden is doing well in rehab.  He is enjoying getting back to three days a week.  His sats have been good.  He should be ready to increase workloads.  We will continue to monitor his progress.  Shameek is getting out and walking at home. He does some work in his garage for exercise. Sometimes he gets his weights out and does a little exercise with them. He gets outside at least every other day.   Expected Outcomes  Short: Increase workloads.  Long: Continue to increase stamina.  Short: Start coming three days a week.  Long: Continue to work on stamina.  Short - work in [employer] range and RPE 11-13 each visit Long - increase overall MET level  --  Short: continue to exercise to improve shortness of breath. Long: maintain exercise to keep shortness of breath at a minimum.   [place]qui Name 12/09/18 1624 12/23/18 0914           Exercise Goal Re-Evaluation   Exercise Goals Review  Increase Physical Activity;Increase Strength and Stamina;Able to understand and use rate of perceived exertion (RPE) scale;Able to understand and use Dyspnea scale;Able to check pulse independently;Knowledge and understanding of Target Heart Rate Range (THRR);Understanding of Exercise Prescription  Increase Physical Activity;Increase Strength and Stamina;Understanding of Exercise Prescription      Comments  Hamish has added speed to TM and up to Level 3  on T5.  His oxygen stays above 90 most of the time.  Daquavion  is nearing graduation and will be doing his post 6MWT soon.  He at 22 watts on the bike.  We will continue to monitor his progress.      Expected Outcomes  Short - continue to attend consistently Long - maintain exercise on his own  Short: Improve post 6MWT.  Long: Continue to improve stamina.         Nutrition & Weight - Outcomes:  Post Biometrics - 12/23/18 1604        Post  Biometrics   Height  5' 8"  (1.727 m)    Weight  196 lb 12.8 oz (89.3 kg)    BMI (Calculated)  29.93    Single Leg Stand  2.75 seconds       Nutrition:   Nutrition Discharge:   Education Questionnaire Score:   Goals reviewed with patient; copy given to patient.

## 2018-12-31 NOTE — Telephone Encounter (Signed)
Sleep study has been received via fax and mailed to address on file.  Pt will call back with update once he reviews sleep study.

## 2019-01-03 ENCOUNTER — Other Ambulatory Visit: Payer: Self-pay

## 2019-01-03 ENCOUNTER — Encounter: Payer: Medicare Other | Admitting: *Deleted

## 2019-01-03 DIAGNOSIS — J449 Chronic obstructive pulmonary disease, unspecified: Secondary | ICD-10-CM

## 2019-01-03 NOTE — Progress Notes (Signed)
Pulmonary Individual Treatment Plan  Patient Details  Name: Gordon Cameron MRN: [id number] Date of Birth: 04/17/1934 Referring Provider:     Pulmonary Rehab from 03/30/2018 in [facility]  Referring Provider  Ramachandran      Initial Encounter Date:    Pulmonary Rehab from 03/30/2018 in [facility]  Date  03/30/18      Visit Diagnosis: Chronic obstructive pulmonary disease, unspecified COPD type (HCC)  Patient's Home Medications on Admission:  Current Outpatient Medications:  .  Aclidinium Bromide (TUDORZA PRESSAIR) 400 MCG/ACT AEPB, Inhale 1 puff into the lungs 2 (two) times daily., Disp: 1 each, Rfl: 0 .  Aclidinium Bromide (TUDORZA PRESSAIR) 400 MCG/ACT AEPB, Inhale 1 puff into the lungs daily., Disp: 1 each, Rfl: 0 .  albuterol (VENTOLIN HFA) 108 (90 Base) MCG/ACT inhaler, Inhale 2 puffs into the lungs every 6 (six) hours as needed for wheezing or shortness of breath., Disp: 1 Inhaler, Rfl: 5 .  cetirizine (ZYRTEC) 10 MG tablet, Take 10 mg by mouth daily as needed. , Disp: , Rfl:  .  finasteride (PROSCAR) 5 MG tablet, Once a day, Disp: , Rfl:  .  fluticasone (FLONASE) 50 MCG/ACT nasal spray, Place 2 sprays into both nostrils daily., Disp: , Rfl:  .  furosemide (LASIX) 20 MG tablet, Take 40 mg by mouth daily. , Disp: , Rfl: 2 .  spironolactone (ALDACTONE) 25 MG tablet, Take 25 mg by mouth daily., Disp: , Rfl:   Past Medical History: Past Medical History:  Diagnosis Date  . BPH (benign prostatic hyperplasia)   . Hyperlipidemia   . OSA (obstructive sleep apnea)   . Reactive airway disease   . Restless leg     Tobacco Use: Social History   Tobacco Use  Smoking Status Never Smoker  Smokeless Tobacco Never Used    Labs: Recent Review Flowsheet Data    There is no flowsheet data to display.       Pulmonary Assessment Scores:   UCSD: Self-administered rating of dyspnea associated with activities of daily living  (ADLs) 6-point scale (0 = "not at all" to 5 = "maximal or unable to do because of breathlessness")  Scoring Scores range from 0 to 120.  Minimally important difference is 5 units  CAT: CAT can identify the health impairment of COPD patients and is better correlated with disease progression.  CAT has a scoring range of zero to 40. The CAT score is classified into four groups of low (less than 10), medium (10 - 20), high (21-30) and very high (31-40) based on the impact level of disease on health status. A CAT score over 10 suggests significant symptoms.  A worsening CAT score could be explained by an exacerbation, poor medication adherence, poor inhaler technique, or progression of COPD or comorbid conditions.  CAT MCID is 2 points  mMRC: mMRC (Modified Medical Research Council) Dyspnea Scale is used to assess the degree of baseline functional disability in patients of respiratory disease due to dyspnea. No minimal important difference is established. A decrease in score of 1 point or greater is considered a positive change.   Pulmonary Function Assessment:   Exercise Target Goals: Exercise Program Goal: Individual exercise prescription set using results from initial 6 min walk test and THRR while considering  patient's activity barriers and safety.   Exercise Prescription Goal: Initial exercise prescription builds to 30-45 minutes a day of aerobic activity, 2-3 days per week.  Home exercise guidelines will be given   to patient during program as part of exercise prescription that the participant will acknowledge.  Activity Barriers & Risk Stratification:   6 Minute Walk: 6 Minute Walk    Row Name 12/23/18 1602         6 Minute Walk   Phase  Discharge     Distance  1360 feet     Distance % Change  7 %     Distance Feet Change  90 ft     Walk Time  6 minutes     # of Rest Breaks  0     MPH  2.58     METS  2.82     RPE  13     Perceived Dyspnea   2     VO2 Peak  9.87      Symptoms  Yes (comment)     Comments  SOB     Resting HR  87 bpm     Resting BP  126/62     Resting Oxygen Saturation   97 %     Exercise Oxygen Saturation  during 6 min walk  94 %     Gordon Ex. HR  119 bpm     Gordon Ex. BP  172/74     2 Minute Post BP  136/70       Interval HR   1 Minute HR  103     2 Minute HR  112     3 Minute HR  116     4 Minute HR  116     5 Minute HR  117     6 Minute HR  119     2 Minute Post HR  94     Interval Heart Rate?  Yes       Interval Oxygen   Interval Oxygen?  Yes     Baseline Oxygen Saturation %  97 %     1 Minute Oxygen Saturation %  95 %     1 Minute Liters of Oxygen  0 L     2 Minute Oxygen Saturation %  96 %     2 Minute Liters of Oxygen  0 L     3 Minute Oxygen Saturation %  95 %     3 Minute Liters of Oxygen  0 L     4 Minute Oxygen Saturation %  94 %     4 Minute Liters of Oxygen  0 L     5 Minute Oxygen Saturation %  94 %     5 Minute Liters of Oxygen  0 L     6 Minute Oxygen Saturation %  94 %     6 Minute Liters of Oxygen  0 L     2 Minute Post Oxygen Saturation %  95 %     2 Minute Post Liters of Oxygen  0 L       Oxygen Initial Assessment:   Oxygen Re-Evaluation: Oxygen Re-Evaluation    Row Name 07/13/18 1135 08/02/18 1554 09/30/18 1551 11/04/18 1618 12/02/18 1550     Program Oxygen Prescription   Program Oxygen Prescription  None  None  None  None  None     Home Oxygen   Home Oxygen Device  None  None  None  -  None   Sleep Oxygen Prescription  CPAP  CPAP  CPAP  CPAP  CPAP   Liters per minute  0  0  0  0  0   Home Exercise Oxygen Prescription  None  None  None  None  None   Home at Rest Exercise Oxygen Prescription  None  None  None  None  None   Compliance with Home Oxygen Use  Yes  Yes  Yes  Yes  Yes     Goals/Expected Outcomes   Short Term Goals  To learn and demonstrate proper use of respiratory medications;To learn and demonstrate proper pursed lip breathing techniques or other breathing techniques.;To learn  and understand importance of maintaining oxygen saturations>88%;To learn and understand importance of monitoring SPO2 with pulse oximeter and demonstrate accurate use of the pulse oximeter.  To learn and demonstrate proper use of respiratory medications;To learn and demonstrate proper pursed lip breathing techniques or other breathing techniques.;To learn and understand importance of maintaining oxygen saturations>88%;To learn and understand importance of monitoring SPO2 with pulse oximeter and demonstrate accurate use of the pulse oximeter.  To learn and exhibit compliance with exercise, home and travel O2 prescription;To learn and understand importance of monitoring SPO2 with pulse oximeter and demonstrate accurate use of the pulse oximeter.;To learn and understand importance of maintaining oxygen saturations>88%;To learn and demonstrate proper pursed lip breathing techniques or other breathing techniques.;To learn and demonstrate proper use of respiratory medications  To learn and exhibit compliance with exercise, home and travel O2 prescription;To learn and understand importance of monitoring SPO2 with pulse oximeter and demonstrate accurate use of the pulse oximeter.;To learn and understand importance of maintaining oxygen saturations>88%;To learn and demonstrate proper pursed lip breathing techniques or other breathing techniques.;To learn and demonstrate proper use of respiratory medications  To learn and exhibit compliance with exercise, home and travel O2 prescription;To learn and understand importance of monitoring SPO2 with pulse oximeter and demonstrate accurate use of the pulse oximeter.;To learn and understand importance of maintaining oxygen saturations>88%;To learn and demonstrate proper pursed lip breathing techniques or other breathing techniques.;To learn and demonstrate proper use of respiratory medications   Long  Term Goals  Verbalizes importance of monitoring SPO2 with pulse oximeter and  return demonstration;Maintenance of O2 saturations>88%;Exhibits proper breathing techniques, such as pursed lip breathing or other method taught during program session;Compliance with respiratory medication;Demonstrates proper use of MDI's  Verbalizes importance of monitoring SPO2 with pulse oximeter and return demonstration;Maintenance of O2 saturations>88%;Exhibits proper breathing techniques, such as pursed lip breathing or other method taught during program session;Compliance with respiratory medication;Demonstrates proper use of MDI's  Exhibits compliance with exercise, home and travel O2 prescription;Verbalizes importance of monitoring SPO2 with pulse oximeter and return demonstration;Maintenance of O2 saturations>88%;Exhibits proper breathing techniques, such as pursed lip breathing or other method taught during program session;Demonstrates proper use of MDI's;Compliance with respiratory medication  Exhibits compliance with exercise, home and travel O2 prescription;Verbalizes importance of monitoring SPO2 with pulse oximeter and return demonstration;Maintenance of O2 saturations>88%;Exhibits proper breathing techniques, such as pursed lip breathing or other method taught during program session;Demonstrates proper use of MDI's;Compliance with respiratory medication  Exhibits compliance with exercise, home and travel O2 prescription;Verbalizes importance of monitoring SPO2 with pulse oximeter and return demonstration;Maintenance of O2 saturations>88%;Exhibits proper breathing techniques, such as pursed lip breathing or other method taught during program session;Compliance with respiratory medication;Demonstrates proper use of MDI's   Comments  Claudio continues to practice PLB and is using his CPAP. He still reports mild SOB, but nothing abnormal for him  Bao has been compliant with his CPAP.  He uses his PLB, but his SOB stays about the same.  Patient is  wearing his CPAP every night. He takes his  albuterol when he needs it. He has yet to get a pulse oximeter. Informed patient where and how to obtain a pulse oximeter.  Elvis continues to use CPAP every night.  He uses a full face mask currently, but since his surgery it is not fitting as well as it used to.  He will have a new sleep study once he meets his new doctor.  He still has more SOB then he would like but it is getting better.  He is using his PLB to help, but sometimes forgets as it is not habit yet.  He has also started a new inhaler and hopes that it will start to help as well.  He still has not gotten a pulse oximeter yet, but is planning on it.  Tremond is not happy with his pulmonologist and is not able to get appointmens with whome he needs. He is having a sleep study done tonight at 845. Caetano has got a pulse oximeter, is checking his oxygen and heart rate daily.   Goals/Expected Outcomes  Short: continue using PLB, increase physical activity. Long: independence with managing SOB  Short: continue using PLB, increase physical activity. Long: independence with managing SOB  Short:obtain a pulse oximeter. Long: monitor oxygen levels on a daily basis.  `  Short: perform sleep study. Long: Let [facility] staff knoiw what results were of sleep study.      Oxygen Discharge (Final Oxygen Re-Evaluation): Oxygen Re-Evaluation - 12/02/18 1550      Program Oxygen Prescription   Program Oxygen Prescription  None      Home Oxygen   Home Oxygen Device  None    Sleep Oxygen Prescription  CPAP    Liters per minute  0    Home Exercise Oxygen Prescription  None    Home at Rest Exercise Oxygen Prescription  None    Compliance with Home Oxygen Use  Yes      Goals/Expected Outcomes   Short Term Goals  To learn and exhibit compliance with exercise, home and travel O2 prescription;To learn and understand importance of monitoring SPO2 with pulse oximeter and demonstrate accurate use of the pulse oximeter.;To learn and understand importance of  maintaining oxygen saturations>88%;To learn and demonstrate proper pursed lip breathing techniques or other breathing techniques.;To learn and demonstrate proper use of respiratory medications    Long  Term Goals  Exhibits compliance with exercise, home and travel O2 prescription;Verbalizes importance of monitoring SPO2 with pulse oximeter and return demonstration;Maintenance of O2 saturations>88%;Exhibits proper breathing techniques, such as pursed lip breathing or other method taught during program session;Compliance with respiratory medication;Demonstrates proper use of MDI's    Comments  Tajay is not happy with his pulmonologist and is not able to get appointmens with whome he needs. He is having a sleep study done tonight at 845. Jemery has got a pulse oximeter, is checking his oxygen and heart rate daily.    Goals/Expected Outcomes  Short: perform sleep study. Long: Let [facility] staff knoiw what results were of sleep study.       Initial Exercise Prescription:   Perform Capillary Blood Glucose checks as needed.  Exercise Prescription Changes: Exercise Prescription Changes    Row Name 09/10/18 0800 10/12/18 1500 11/02/18 1000 11/10/18 1400 11/24/18 1300     Response to Exercise   Blood Pressure (Admit)  98/52  104/64  110/58  112/56  128/64   Blood Pressure (Exercise)  120/70  124/64  118/70  114/56  128/70   Blood Pressure (Exit)  118/70  110/60  118/62  126/70  132/60   Heart Rate (Admit)  78 bpm  75 bpm  86 bpm  80 bpm  88 bpm   Heart Rate (Exercise)  106 bpm  94 bpm  101 bpm  100 bpm  107 bpm   Heart Rate (Exit)  90 bpm  84 bpm  92 bpm  93 bpm  97 bpm   Oxygen Saturation (Admit)  97 %  97 %  96 %  97 %  98 %   Oxygen Saturation (Exercise)  96 %  96 %  96 %  97 %  96 %   Oxygen Saturation (Exit)  97 %  96 %  95 %  98 %  98 %   Rating of Perceived Exertion (Exercise)  12  12  11  12  12    Perceived Dyspnea (Exercise)  2  2  2  2  2    Symptoms  none  none  none  none  none    Duration  Progress to 30 minutes of  aerobic without signs/symptoms of physical distress  Continue with 30 min of aerobic exercise without signs/symptoms of physical distress.  Continue with 30 min of aerobic exercise without signs/symptoms of physical distress.  Continue with 30 min of aerobic exercise without signs/symptoms of physical distress.  Continue with 30 min of aerobic exercise without signs/symptoms of physical distress.   Intensity  THRR unchanged  THRR unchanged  THRR unchanged  THRR unchanged  THRR unchanged     Progression   Progression  Continue to progress workloads to maintain intensity without signs/symptoms of physical distress.  Continue to progress workloads to maintain intensity without signs/symptoms of physical distress.  Continue to progress workloads to maintain intensity without signs/symptoms of physical distress.  Continue to progress workloads to maintain intensity without signs/symptoms of physical distress.  Continue to progress workloads to maintain intensity without signs/symptoms of physical distress.   Average METs  2.3  2.17  2.46  2  2.11     Resistance Training   Training Prescription  Yes  Yes  Yes  Yes  Yes   Weight  3 lbs  3 lbs  3 lbs  3 lb  3 lbs   Reps  10-15  10-15  10-15  10-15  10-15     Interval Training   Interval Training  No  No  No  No  No     Treadmill   MPH  1.7  1.7  -  -  1.8   Grade  0  0  -  -  1   Minutes  15  15  -  -  15   METs  2.3  2.3  -  -  2.63     Recumbant Bike   Level  -  2  3  -  3   Watts  -  -  25  -  -   Minutes  -  15  15  -  15   METs  -  -  2.93  -  -     NuStep   Level  -  -  -  -  2   Minutes  -  -  -  -  15   METs  -  -  -  -  1.7     T5 Nustep   Level  2  2  -  -  -  SPM  80  -  -  -  -   Minutes  15  15  -  -  -   METs  2.2  2.2  -  -  -     Biostep-RELP   Level  -  1  4  4  2    SPM  -  -  -  50  -   Minutes  -  15  15  15  15    METs  -  2  2  2  2      Home Exercise Plan   Plans to  continue exercise at  -  Home (comment) walking  Home (comment) walking  Home (comment) walking  Home (comment) walking   Frequency  -  Add 3 additional days to program exercise sessions.  Add 3 additional days to program exercise sessions.  Add 3 additional days to program exercise sessions.  Add 3 additional days to program exercise sessions.   Initial Home Exercises Provided  -  04/14/18  04/14/18  04/14/18  04/14/18   Row Name 12/09/18 1600 12/23/18 0900           Response to Exercise   Blood Pressure (Admit)  102/64  144/86      Blood Pressure (Exercise)  132/76  136/64      Blood Pressure (Exit)  110/68  110/68      Heart Rate (Admit)  76 bpm  80 bpm      Heart Rate (Exercise)  113 bpm  99 bpm      Heart Rate (Exit)  90 bpm  92 bpm      Oxygen Saturation (Admit)  98 %  97 %      Oxygen Saturation (Exercise)  86 %  95 %      Oxygen Saturation (Exit)  98 %  99 %      Rating of Perceived Exertion (Exercise)  13  12      Perceived Dyspnea (Exercise)  2  2      Symptoms  none  none      Duration  Continue with 30 min of aerobic exercise without signs/symptoms of physical distress.  Continue with 30 min of aerobic exercise without signs/symptoms of physical distress.      Intensity  THRR unchanged  THRR unchanged        Progression   Progression  Continue to progress workloads to maintain intensity without signs/symptoms of physical distress.  Continue to progress workloads to maintain intensity without signs/symptoms of physical distress.      Average METs  2.26  2.46        Resistance Training   Training Prescription  Yes  Yes      Weight   3 lb  4 lb      Reps  10-15  10-15        Interval Training   Interval Training  No  No        Treadmill   MPH  1.9  1.9      Grade  1  1      Minutes  15  15      METs  2.72  2.72        Recumbant Bike   Level  -  3      Watts  -  22      Minutes  -  15      METs  -  2.67  REL-XR   Level  -  1      Minutes  -  15       METs  -  2.4        T5 Nustep   Level  3  3      Minutes  15  15      METs  1.8  -        Biostep-RELP   Level  -  2      Minutes  -  15      METs  -  2        Home Exercise Plan   Plans to continue exercise at  Home (comment) walking  Home (comment) walking      Frequency  Add 3 additional days to program exercise sessions.  Add 3 additional days to program exercise sessions.      Initial Home Exercises Provided  04/14/18  04/14/18         Exercise Comments: Exercise Comments    Row Name 09/09/18 1717           Exercise Comments  Counseled pt on increased weight >3lbs, pt stated he was aware, he went to his PCP and did not take his Lasix as ordered, he also stated he ate before coming to class. Pt is asymptomatic and will resume Lasix as ordered by MD.          Exercise Goals and Review:   Exercise Goals Re-Evaluation : Exercise Goals Re-Evaluation    Row Name 07/13/18 1126 08/02/18 1549 09/10/18 0859 09/30/18 1546 10/12/18 1552     Exercise Goal Re-Evaluation   Exercise Goals Review  Increase Physical Activity;Increase Strength and Stamina;Understanding of Exercise Prescription  Increase Physical Activity;Increase Strength and Stamina;Understanding of Exercise Prescription  Increase Physical Activity;Increase Strength and Stamina;Able to understand and use rate of perceived exertion (RPE) scale;Able to understand and use Dyspnea scale;Knowledge and understanding of Target Heart Rate Range (THRR);Able to check pulse independently;Understanding of Exercise Prescription  Increase Physical Activity;Increase Strength and Stamina;Able to understand and use Dyspnea scale;Understanding of Exercise Prescription  Increase Physical Activity;Increase Strength and Stamina;Understanding of Exercise Prescription   Comments  Detron reports continuing to do well at home. He has been slowly increasing activity, but weather has gotten in the way. He stays busy around the house with his wife as  company  Venson continues to do well at home.  He is walking some.  He feels that he has improved since his first session in rehab and feels that he has gotten stronger.  He is feeling good overall.  Kentrel has done well since returning to in person sessions.  He works at at [employer].  Staff will monitor progress  Jaquavis wants to be able to walk faster and go longer distances. He wants to go up in weights on his strength training. He is only able to eat soft foods and is not eating the best since he needs oral surgery.  Tekoa is doing well in rehab.  His attendance has been lacking recently due to his oral surgery needs.  He wants to keep coming.  He is up to 2 METs on the BioStep.  We will continue to monitor his progress.   Expected Outcomes  Short: add an extra day of intentional exercise. Long: develop exercise routine that is sustainable.  Short:Continue to make time to exercise. Long: Continue to build stamina.  Short - work towards target HR range Long - build  stamina  Short: get oral surgery to maintain nutrtion to improve strength and staminia. Long: maintain exercise and increase work loads.  Short: Attend rehab regularly again.  Long: Continue to exericse on his own at home on off days.   [place] Name 11/02/18 1023 11/04/18 1609 11/10/18 1445 11/24/18 1227 12/02/18 1548     Exercise Goal Re-Evaluation   Exercise Goals Review  Increase Physical Activity;Increase Strength and Stamina;Understanding of Exercise Prescription  Increase Physical Activity;Increase Strength and Stamina;Understanding of Exercise Prescription  Increase Physical Activity;Increase Strength and Stamina;Able to understand and use rate of perceived exertion (RPE) scale;Knowledge and understanding of Target Heart Rate Range (THRR);Able to check pulse independently;Understanding of Exercise Prescription  Increase Physical Activity;Increase Strength and Stamina;Understanding of Exercise Prescription  Increase Physical Activity;Increase  Strength and Stamina   Comments  Jabari continues to do well in rehab. He was able to return to his workloads.  We will continue to move up workloads and monitor his progress.  Jourdain is doing well in rehab.  He has increased some workloads.  He has not been doing his home exercise but is staying active by working around the house.  He is feeling stronger and has some more stamina, he wishes it would recover faster.  We have now added in Mondays for three days a week as well.  Josia is not reaching THR range and has been low on RPE at times- staff will encourage him to increase levels to reach these goals  Nickey is doing well in rehab.  He is enjoying getting back to three days a week.  His sats have been good.  He should be ready to increase workloads.  We will continue to monitor his progress.  Enoc is getting out and walking at home. He does some work in his garage for exercise. Sometimes he gets his weights out and does a little exercise with them. He gets outside at least every other day.   Expected Outcomes  Short: Increase workloads.  Long: Continue to increase stamina.  Short: Start coming three days a week.  Long: Continue to work on stamina.  Short - work in [employer] range and RPE 11-13 each visit Long - increase overall MET level  -  Short: continue to exercise to improve shortness of breath. Long: maintain exercise to keep shortness of breath at a minimum.   [place] Name 12/09/18 1624 12/23/18 0914           Exercise Goal Re-Evaluation   Exercise Goals Review  Increase Physical Activity;Increase Strength and Stamina;Able to understand and use rate of perceived exertion (RPE) scale;Able to understand and use Dyspnea scale;Able to check pulse independently;Knowledge and understanding of Target Heart Rate Range (THRR);Understanding of Exercise Prescription  Increase Physical Activity;Increase Strength and Stamina;Understanding of Exercise Prescription      Comments  Jeremiah has added speed to TM and up  to Level 3  on T5.  His oxygen stays above 90 most of the time.  Lalo  is nearing graduation and will be doing his post 6MWT soon.  He at 22 watts on the bike.  We will continue to monitor his progress.      Expected Outcomes  Short - continue to attend consistently Long - maintain exercise on his own  Short: Improve post 6MWT.  Long: Continue to improve stamina.         Discharge Exercise Prescription (Final Exercise Prescription Changes): Exercise Prescription Changes - 12/23/18 0900      Response to Exercise  Blood Pressure (Admit)  144/86    Blood Pressure (Exercise)  136/64    Blood Pressure (Exit)  110/68    Heart Rate (Admit)  80 bpm    Heart Rate (Exercise)  99 bpm    Heart Rate (Exit)  92 bpm    Oxygen Saturation (Admit)  97 %    Oxygen Saturation (Exercise)  95 %    Oxygen Saturation (Exit)  99 %    Rating of Perceived Exertion (Exercise)  12    Perceived Dyspnea (Exercise)  2    Symptoms  none    Duration  Continue with 30 min of aerobic exercise without signs/symptoms of physical distress.    Intensity  THRR unchanged      Progression   Progression  Continue to progress workloads to maintain intensity without signs/symptoms of physical distress.    Average METs  2.46      Resistance Training   Training Prescription  Yes    Weight  4 lb    Reps  10-15      Interval Training   Interval Training  No      Treadmill   MPH  1.9    Grade  1    Minutes  15    METs  2.72      Recumbant Bike   Level  3    Watts  22    Minutes  15    METs  2.67      REL-XR   Level  1    Minutes  15    METs  2.4      T5 Nustep   Level  3    Minutes  15      Biostep-RELP   Level  2    Minutes  15    METs  2      Home Exercise Plan   Plans to continue exercise at  Home (comment)   walking   Frequency  Add 3 additional days to program exercise sessions.    Initial Home Exercises Provided  04/14/18       Nutrition:  Target Goals: Understanding of nutrition  guidelines, daily intake of sodium <1510m, cholesterol <2061m calories 30% from fat and 7% or less from saturated fats, daily to have 5 or more servings of fruits and vegetables.  Biometrics:  Post Biometrics - 12/23/18 1604       Post  Biometrics   Height  5' 8"  (1.727 m)    Weight  196 lb 12.8 oz (89.3 kg)    BMI (Calculated)  29.93    Single Leg Stand  2.75 seconds       Nutrition Therapy Plan and Nutrition Goals:   Nutrition Assessments:   Nutrition Goals Re-Evaluation: Nutrition Goals Re-Evaluation    [place] 11/08/18 1547 11/29/18 1604 12/20/18 1550 12/27/18 1614       Goals   Nutrition Goal  ST:/LT: continue HH eating and current diet plan, manage fluid  ST:/LT: continue HH eating and current diet plan, manage fluid  ST:/LT: continue HH eating and current diet plan, manage fluid  ST:/LT: continue HH eating and current diet plan, manage fluid    Comment  Can eat more kinds of foods with new dental surgeries. Still on lasiks and is doing well. No loss of appetite or trouble breathing while eating. Pt reports eating lots of chicken, with some red meat. Pt reports he is constrained to wifes limitations such as vegetables. Pt reports still watching  sodium, no table salt and reading labels. Pt wants to continue with his current diet plan.  Pt would like to continue doing what he has been doing. Pt reports he has been retaining fluid - discussed importance of sodium; pt report doing well with monitoring sodium.  Pt would like to continue doing what he has been doing.  Pt would like to continue doing what he has been doing.    Expected Outcome  ST:/LT: continue HH eating and current diet plan, manage fluid  ST:/LT: continue HH eating and current diet plan, manage fluid  ST:/LT: continue HH eating and current diet plan, manage fluid  ST:/LT: continue HH eating and current diet plan, manage fluid       Nutrition Goals Discharge (Final Nutrition Goals Re-Evaluation): Nutrition Goals  Re-Evaluation - 12/27/18 1614      Goals   Nutrition Goal  ST:/LT: continue HH eating and current diet plan, manage fluid    Comment  Pt would like to continue doing what he has been doing.    Expected Outcome  ST:/LT: continue HH eating and current diet plan, manage fluid       Psychosocial: Target Goals: Acknowledge presence or absence of significant depression and/or stress, maximize coping skills, provide positive support system. Participant is able to verbalize types and ability to use techniques and skills needed for reducing stress and depression.   Initial Review & Psychosocial Screening:   Quality of Life Scores:  Scores of 19 and below usually indicate a poorer quality of life in these areas.  A difference of  2-3 points is a clinically meaningful difference.  A difference of 2-3 points in the total score of the Quality of Life Index has been associated with significant improvement in overall quality of life, self-image, physical symptoms, and general health in studies assessing change in quality of life.  PHQ-9: Recent Review Flowsheet Data    Depression screen [facility] 2/9 03/30/2018   Decreased Interest 0   Down, Depressed, Hopeless 0   PHQ - 2 Score 0   Altered sleeping 1   Tired, decreased energy 1   Change in appetite 1   Feeling bad or failure about yourself  0   Trouble concentrating 0   Moving slowly or fidgety/restless 0   Suicidal thoughts 0   PHQ-9 Score 3   Difficult doing work/chores Not difficult at all     Interpretation of Total Score  Total Score Depression Severity:  1-4 = Minimal depression, 5-9 = Mild depression, 10-14 = Moderate depression, 15-19 = Moderately severe depression, 20-27 = Severe depression   Psychosocial Evaluation and Intervention: Psychosocial Evaluation - 12/30/18 1542      Discharge Psychosocial Assessment & Intervention   Comments  Timouthy states that he liked the program and was able to push himself. He cannot tell a big  difference in his breathing but is able to understand how to monitor his shortness of breath and work on breathing techniques. He has enjoyed the program and is in a good state with his mental health. He is aware of the [facility] and may look into exercising there.       Psychosocial Re-Evaluation: Psychosocial Re-Evaluation    [place] Name 07/13/18 1133 08/02/18 1550 09/30/18 1556 11/04/18 1615 12/02/18 1553     Psychosocial Re-Evaluation   Current issues with  Current Stress Concerns  Current Stress Concerns  Current Stress Concerns  Current Stress Concerns  Current Sleep Concerns;Current Stress Concerns   Comments  Damascus reports "  feeling good considering the circumstances" and "doing as good as everyone else." He and his wife stay busy around the house which helps him feel good  Tanuj is doing okay.  He tries to stay busy and is able to do some things.  His health continues to be his biggest stressor.  Patient is stressed about his oral surgery that is coming up. He is not able to eat what he wants and has to eat things that he is able to gum. His surgery is next Wednesday on his mouth.  Mitcheal is doing well mentally.  He denies any symptoms of depression or anxiety.  He gets frustrated that he can't do as much as he used to do.  He sleeps pretty good and uses his CPAP.  He will have a new pulmonologist and sleep study.  He usually falls asleep in the recliner.  Calub is not sleeping as well as he used to. He has a sleep study tonight to see if he needs more pressure and how his oxygen is doing. Domnic has a friend who has COVID and he is not doing well. He is worried for his friends outcome.   Expected Outcomes  Short: add extra day of exericse to help with mood. Long: stay upbeat and find a "new normal" during all whats going on with COVID  Short: Exercise regularly for mood boost.  Long: Continue to stay positive.  Short: get his oral surgery. long: maintain exercise prescription to minimize stress.   Short: Meet new pulmonologist.  Long: Continue to stay positive.  Short: obtain sleep study to help with daily sleep. Long: maintain a positive outlook on his friend and his sleep.   Interventions  Encouraged to attend Pulmonary Rehabilitation for the exercise  -  Encouraged to attend Pulmonary Rehabilitation for the exercise  Encouraged to attend Pulmonary Rehabilitation for the exercise  Encouraged to attend Pulmonary Rehabilitation for the exercise   Continue Psychosocial Services   -  -  Follow up required by staff  Follow up required by staff  Follow up required by staff      Psychosocial Discharge (Final Psychosocial Re-Evaluation): Psychosocial Re-Evaluation - 12/02/18 1553      Psychosocial Re-Evaluation   Current issues with  Current Sleep Concerns;Current Stress Concerns    Comments  Marce is not sleeping as well as he used to. He has a sleep study tonight to see if he needs more pressure and how his oxygen is doing. Yahsir has a friend who has COVID and he is not doing well. He is worried for his friends outcome.    Expected Outcomes  Short: obtain sleep study to help with daily sleep. Long: maintain a positive outlook on his friend and his sleep.    Interventions  Encouraged to attend Pulmonary Rehabilitation for the exercise    Continue Psychosocial Services   Follow up required by staff       Education: Education Goals: Education classes will be provided on a weekly basis, covering required topics. Participant will state understanding/return demonstration of topics presented.  Learning Barriers/Preferences:   Education Topics:  Initial Evaluation Education: - Verbal, written and demonstration of respiratory meds, oximetry and breathing techniques. Instruction on use of nebulizers and MDIs and importance of monitoring MDI activations.   Cardiac Rehab from 11/11/2018 in [facility] Cardiac and Pulmonary Rehab  Date  03/30/18  Educator  [facility]  Instruction Review Code  1- Verbalizes  Understanding      General Nutrition Guidelines/Fats and  Fiber: -Group instruction provided by verbal, written material, models and posters to present the general guidelines for heart healthy nutrition. Gives an explanation and review of dietary fats and fiber.   Cardiac Rehab from 12/23/2018 in [facility] Cardiac and Pulmonary Rehab  Date  12/09/18  Educator  [facility]  Instruction Review Code  1- Verbalizes Understanding      Controlling Sodium/Reading Food Labels: -Group verbal and written material supporting the discussion of sodium use in heart healthy nutrition. Review and explanation with models, verbal and written materials for utilization of the food label.   Exercise Physiology & General Exercise Guidelines: - Group verbal and written instruction with models to review the exercise physiology of the cardiovascular system and associated critical values. Provides general exercise guidelines with specific guidelines to those with heart or lung disease.    Aerobic Exercise & Resistance Training: - Gives group verbal and written instruction on the various components of exercise. Focuses on aerobic and resistive training programs and the benefits of this training and how to safely progress through these programs.   Cardiac Rehab from 12/23/2018 in [facility] Cardiac and Pulmonary Rehab  Date  11/25/18  Educator  [facility]  Instruction Review Code  1- Verbalizes Understanding      Flexibility, Balance, Mind/Body Relaxation: Provides group verbal/written instruction on the benefits of flexibility and balance training, including mind/body exercise modes such as yoga, pilates and tai chi.  Demonstration and skill practice provided.   Cardiac Rehab from 12/23/2018 in [facility] Cardiac and Pulmonary Rehab  Date  12/09/18 [Balance 12/3]  Educator  [facility]  Instruction Review Code  1- Verbalizes Understanding      Stress and Anxiety: - Provides group verbal and written instruction about the health risks of elevated  stress and causes of high stress.  Discuss the correlation between heart/lung disease and anxiety and treatment options. Review healthy ways to manage with stress and anxiety.   Depression: - Provides group verbal and written instruction on the correlation between heart/lung disease and depressed mood, treatment options, and the stigmas associated with seeking treatment.   Exercise & Equipment Safety: - Individual verbal instruction and demonstration of equipment use and safety with use of the equipment.   Cardiac Rehab from 11/11/2018 in [facility] Cardiac and Pulmonary Rehab  Date  03/30/18  Educator  [facility]  Instruction Review Code  1- Verbalizes Understanding      Infection Prevention: - Provides verbal and written material to individual with discussion of infection control including proper hand washing and proper equipment cleaning during exercise session.   Cardiac Rehab from 11/11/2018 in [facility] Cardiac and Pulmonary Rehab  Date  03/30/18  Educator  [facility]  Instruction Review Code  1- Verbalizes Understanding      Falls Prevention: - Provides verbal and written material to individual with discussion of falls prevention and safety.   Cardiac Rehab from 11/11/2018 in [facility] Cardiac and Pulmonary Rehab  Date  03/30/18  Educator  [facility]  Instruction Review Code  1- Verbalizes Understanding      Diabetes: - Individual verbal and written instruction to review signs/symptoms of diabetes, desired ranges of glucose level fasting, after meals and with exercise. Advice that pre and post exercise glucose checks will be done for 3 sessions at entry of program.   Chronic Lung Diseases: - Group verbal and written instruction to review updates, respiratory medications, advancements in procedures and treatments. Discuss use of supplemental oxygen including available portable oxygen systems, continuous and intermittent flow rates, concentrators, personal use and  safety guidelines. Review proper use of inhaler  and spacers. Provide informative websites for self-education.    Energy Conservation: - Provide group verbal and written instruction for methods to conserve energy, plan and organize activities. Instruct on pacing techniques, use of adaptive equipment and posture/positioning to relieve shortness of breath.   Triggers and Exacerbations: - Group verbal and written instruction to review types of environmental triggers and ways to prevent exacerbations. Discuss weather changes, air quality and the benefits of nasal washing. Review warning signs and symptoms to help prevent infections. Discuss techniques for effective airway clearance, coughing, and vibrations.   AED/CPR: - Group verbal and written instruction with the use of models to demonstrate the basic use of the AED with the basic ABC's of resuscitation.   Anatomy and Physiology of the Lungs: - Group verbal and written instruction with the use of models to provide basic lung anatomy and physiology related to function, structure and complications of lung disease.   Cardiac Rehab from 12/23/2018 in [facility] Cardiac and Pulmonary Rehab  Date  12/23/18  Educator  [facility]  Instruction Review Code  1- Verbalizes Understanding      Anatomy & Physiology of the Heart: - Group verbal and written instruction and models provide basic cardiac anatomy and physiology, with the coronary electrical and arterial systems. Review of Valvular disease and Heart Failure   Cardiac Rehab from 12/23/2018 in [facility] Cardiac and Pulmonary Rehab  Date  11/25/18  Educator  [facility]  Instruction Review Code  1- Verbalizes Understanding      Cardiac Medications: - Group verbal and written instruction to review commonly prescribed medications for heart disease. Reviews the medication, class of the drug, and side effects.   Know Your Numbers and Risk Factors: -Group verbal and written instruction about important numbers in your health.  Discussion of what are risk factors and how  they play a role in the disease process.  Review of Cholesterol, Blood Pressure, Diabetes, and BMI and the role they play in your overall health.   Cardiac Rehab from 11/11/2018 in [facility] Cardiac and Pulmonary Rehab  Date  11/11/18  Educator  [facility]  Instruction Review Code  1- Verbalizes Understanding      Sleep Hygiene: -Provides group verbal and written instruction about how sleep can affect your health.  Define sleep hygiene, discuss sleep cycles and impact of sleep habits. Review good sleep hygiene tips.    Other: -Provides group and verbal instruction on various topics (see comments)    Knowledge Questionnaire Score:    Core Components/Risk Factors/Patient Goals at Admission:   Core Components/Risk Factors/Patient Goals Review:  Goals and Risk Factor Review    Row Name 07/13/18 1129 08/02/18 1552 09/30/18 1559 11/04/18 1617 12/02/18 1557     Core Components/Risk Factors/Patient Goals Review   Personal Goals Review  Weight Management/Obesity;Hypertension;Improve shortness of breath with ADL's  Weight Management/Obesity;Hypertension;Improve shortness of breath with ADL's  Weight Management/Obesity;Improve shortness of breath with ADL's  Weight Management/Obesity;Improve shortness of breath with ADL's  Weight Management/Obesity;Improve shortness of breath with ADL's;Hypertension   Review  Hisao reports no issues at home managing his risk factors. His weight loss is going well. His SOB is doing okay, he said the weather hasnt been helping.Chalmers Cater has been struggling with his weight some and attributes it to increased fluid.  He is now on 40 mg of Lasix 3x a day.  It seems to help most days, but some days he still feels heavy.  He checks his pressures  at least twice a day and they have been staying pretty good.  He does get a few reading in the 90s but he denies any symptoms with these.  He is taking lasix for the fluid around his heart. His primary care is monitoring his lasix intake.  Miachel has lost weight since mark and has reached his goal of weighing 190 pounds. He weighed in today at 188.5 pounds.  Braydin is staying around 190 lbs for his weight still.  He will go up and down.  He can eat better down after his oral surgery.  His breathing is getting better.  Patients weight has gone up a little bit since the start of the program. He thinks that it is more water retention. His doctor is aware of his fluid retention and has since then increased his lasix.   Expected Outcomes  Short: continue to work on weight loss. Long: independence in management of risk factors.  Short: Continue to work on weight loss.  Long: Continue to monitor risk factors.  Short: improve on ADL. Long: be able to maintain ADL's independently.  Short: Continue to monitor weight.  Long; Continue to monitor risk factors.  Short: continue watching fluid intake to help with shortness of breath. Long: Lose weight.      Core Components/Risk Factors/Patient Goals at Discharge (Final Review):  Goals and Risk Factor Review - 12/02/18 1557      Core Components/Risk Factors/Patient Goals Review   Personal Goals Review  Weight Management/Obesity;Improve shortness of breath with ADL's;Hypertension    Review  Patients weight has gone up a little bit since the start of the program. He thinks that it is more water retention. His doctor is aware of his fluid retention and has since then increased his lasix.    Expected Outcomes  Short: continue watching fluid intake to help with shortness of breath. Long: Lose weight.       ITP Comments: ITP Comments    Row Name 08/19/18 1150 09/01/18 0553 09/23/18 1025 09/28/18 0913 09/29/18 0554   ITP Comments  Jaggar has been doing the same across the board.  He said that he has not noticed any big changes.  He is now scheduled to return on 8/10 at 1045.  30 Day Review Completed today. Continue with ITP unless changed by Medical Director review.  Claude is out this week for a dental  procedure.  Unable to complete nutrition 30-day re-eval cycle due to inconsistent attendence  30 Day review. Continue with ITP unless directed changes per Medical Director review.   [place] Name 10/27/18 1256 11/24/18 0619 12/22/18 0933 01/03/19 1618     ITP Comments  30 day review completed. ITP sent to Dr. Emily Filbert, Medical Director of Cardiac and Pulmonary Rehab. Continue with ITP unless changes are made by physician.  Department closed starting 10/2 until further notice by infection prevention and Health at Work teams for COVID-19.  30 day review completed. Continue with ITP sent to Dr. Emily Filbert, Medical Director of Cardiac and Pulmonary Rehab for review , changes as needed and signature.  30 day review competed . ITP sent to Dr Emily Filbert for review, changes as needed and ITP approval signature.  Mahonri graduated today from  rehab with 36 sessions completed.  Details of the patient's exercise prescription and what He needs to do in order to continue the prescription and progress were discussed with patient.  Patient was given a copy of prescription and goals.  Patient verbalized understanding.  Rajendra plans to continue to exercise by walking at home.       Comments: Discharge ITP

## 2019-01-03 NOTE — Progress Notes (Signed)
Daily Session Note  Patient Details  Name: Gordon Cameron MRN: [id number] Date of Birth: 12-09-1934 Referring Provider:     Pulmonary Rehab from 03/30/2018 in [facility] Cardiac and Pulmonary Rehab  Referring Provider  Ashby Dawes      Encounter Date: 01/03/2019  Check In: Session Check In - 01/03/19 1617      Check-In   Supervising physician immediately available to respond to emergencies  See telemetry face sheet for immediately available ER MD    Location  [facility]-Cardiac & Pulmonary Rehab    Staff Present  Renita Papa, RN Moises Blood, BS, ACSM CEP, Exercise Physiologist;Joseph Tessie Fass RCP,RRT,BSRT    Virtual Visit  No    Medication changes reported      No    Fall or balance concerns reported     No    Warm-up and Cool-down  Performed on first and last piece of equipment    Resistance Training Performed  Yes    VAD Patient?  No    PAD/SET Patient?  No      Pain Assessment   Currently in Pain?  No/denies          Social History   Tobacco Use  Smoking Status Never Smoker  Smokeless Tobacco Never Used    Goals Met:  Proper associated with RPD/PD & O2 Sat Improved SOB with ADL's Exercise tolerated well No report of cardiac concerns or symptoms Strength training completed today  Goals Unmet:  Not Applicable  Comments:  Jerimy graduated today from  rehab with 36 sessions completed.  Details of the patient's exercise prescription and what He needs to do in order to continue the prescription and progress were discussed with patient.  Patient was given a copy of prescription and goals.  Patient verbalized understanding.  Crixus plans to continue to exercise by walking at home.    Dr. Emily Filbert is Medical Director for [facility] and [facility].

## 2019-01-03 NOTE — Progress Notes (Signed)
Discharge Progress Report  Patient Details  Name: Gordon Cameron MRN: [id number] Date of Birth: 1934/12/04 Referring Provider:     Pulmonary Rehab from 03/30/2018 in [facility]  Referring Provider  Ramachandran       Number of Visits: 36  Reason for Discharge:  Patient reached a stable level of exercise. Patient independent in their exercise. Patient has met program and personal goals.  Smoking History:  Social History   Tobacco Use  Smoking Status Never Smoker  Smokeless Tobacco Never Used    Diagnosis:  Chronic obstructive pulmonary disease, unspecified COPD type ([place])  ADL UCSD:   Initial Exercise Prescription:   Discharge Exercise Prescription (Final Exercise Prescription Changes): Exercise Prescription Changes - 12/23/18 0900      Response to Exercise   Blood Pressure (Admit)  144/86    Blood Pressure (Exercise)  136/64    Blood Pressure (Exit)  110/68    Heart Rate (Admit)  80 bpm    Heart Rate (Exercise)  99 bpm    Heart Rate (Exit)  92 bpm    Oxygen Saturation (Admit)  97 %    Oxygen Saturation (Exercise)  95 %    Oxygen Saturation (Exit)  99 %    Rating of Perceived Exertion (Exercise)  12    Perceived Dyspnea (Exercise)  2    Symptoms  none    Duration  Continue with 30 min of aerobic exercise without signs/symptoms of physical distress.    Intensity  THRR unchanged      Progression   Progression  Continue to progress workloads to maintain intensity without signs/symptoms of physical distress.    Average METs  2.46      Resistance Training   Training Prescription  Yes    Weight  4 lb    Reps  10-15      Interval Training   Interval Training  No      Treadmill   MPH  1.9    Grade  1    Minutes  15    METs  2.72      Recumbant Bike   Level  3    Watts  22    Minutes  15    METs  2.67      REL-XR   Level  1    Minutes  15    METs  2.4      T5 Nustep   Level  3    Minutes  15      Biostep-RELP    Level  2    Minutes  15    METs  2      Home Exercise Plan   Plans to continue exercise at  Home (comment)   walking   Frequency  Add 3 additional days to program exercise sessions.    Initial Home Exercises Provided  04/14/18       Functional Capacity: 6 Minute Walk    Row Name 12/23/18 1602         6 Minute Walk   Phase  Discharge     Distance  1360 feet     Distance % Change  7 %     Distance Feet Change  90 ft     Walk Time  6 minutes     # of Rest Breaks  0     MPH  2.58     METS  2.82     RPE  13  Perceived Dyspnea   2     VO2 Peak  9.87     Symptoms  Yes (comment)     Comments  SOB     Resting HR  87 bpm     Resting BP  126/62     Resting Oxygen Saturation   97 %     Exercise Oxygen Saturation  during 6 min walk  94 %     Max Ex. HR  119 bpm     Max Ex. BP  172/74     2 Minute Post BP  136/70       Interval HR   1 Minute HR  103     2 Minute HR  112     3 Minute HR  116     4 Minute HR  116     5 Minute HR  117     6 Minute HR  119     2 Minute Post HR  94     Interval Heart Rate?  Yes       Interval Oxygen   Interval Oxygen?  Yes     Baseline Oxygen Saturation %  97 %     1 Minute Oxygen Saturation %  95 %     1 Minute Liters of Oxygen  0 L     2 Minute Oxygen Saturation %  96 %     2 Minute Liters of Oxygen  0 L     3 Minute Oxygen Saturation %  95 %     3 Minute Liters of Oxygen  0 L     4 Minute Oxygen Saturation %  94 %     4 Minute Liters of Oxygen  0 L     5 Minute Oxygen Saturation %  94 %     5 Minute Liters of Oxygen  0 L     6 Minute Oxygen Saturation %  94 %     6 Minute Liters of Oxygen  0 L     2 Minute Post Oxygen Saturation %  95 %     2 Minute Post Liters of Oxygen  0 L        Psychological, QOL, Others - Outcomes: PHQ 2/9: Depression screen PHQ 2/9 03/30/2018  Decreased Interest 0  Down, Depressed, Hopeless 0  PHQ - 2 Score 0  Altered sleeping 1  Tired, decreased energy 1  Change in appetite 1  Feeling bad or  failure about yourself  0  Trouble concentrating 0  Moving slowly or fidgety/restless 0  Suicidal thoughts 0  PHQ-9 Score 3  Difficult doing work/chores Not difficult at all    Quality of Life:   Personal Goals: Goals established at orientation with interventions provided to work toward goal.    Personal Goals Discharge: Goals and Risk Factor Review    Row Name 07/13/18 1129 08/02/18 1552 09/30/18 1559 11/04/18 1617 12/02/18 1557     Core Components/Risk Factors/Patient Goals Review   Personal Goals Review  Weight Management/Obesity;Hypertension;Improve shortness of breath with ADL's  Weight Management/Obesity;Hypertension;Improve shortness of breath with ADL's  Weight Management/Obesity;Improve shortness of breath with ADL's  Weight Management/Obesity;Improve shortness of breath with ADL's  Weight Management/Obesity;Improve shortness of breath with ADL's;Hypertension   Review  Gordon Cameron reports no issues at home managing his risk factors. His weight loss is going well. His SOB is doing okay, he said the weather hasnt been helping.Gordon Cameron has been struggling with his weight some  and attributes it to increased fluid.  He is now on 40 mg of Lasix 3x a day.  It seems to help most days, but some days he still feels heavy.  He checks his pressures at least twice a day and they have been staying pretty good.  He does get a few reading in the 90s but he denies any symptoms with these.  He is taking lasix for the fluid around his heart. His primary care is monitoring his lasix intake. Gordon Cameron has lost weight since Gordon Cameron and has reached his goal of weighing 190 pounds. He weighed in today at 188.5 pounds.  Gordon Cameron is staying around 190 lbs for his weight still.  He will go up and down.  He can eat better down after his oral surgery.  His breathing is getting better.  Patients weight has gone up a little bit since the start of the program. He thinks that it is more water retention. His doctor is aware of his  fluid retention and has since then increased his lasix.   Expected Outcomes  Short: continue to work on weight loss. Long: independence in management of risk factors.  Short: Continue to work on weight loss.  Long: Continue to monitor risk factors.  Short: improve on ADL. Long: be able to maintain ADL's independently.  Short: Continue to monitor weight.  Long; Continue to monitor risk factors.  Short: continue watching fluid intake to help with shortness of breath. Long: Lose weight.      Exercise Goals and Review:   Exercise Goals Re-Evaluation: Exercise Goals Re-Evaluation    Row Name 07/13/18 1126 08/02/18 1549 09/10/18 0859 09/30/18 1546 10/12/18 1552     Exercise Goal Re-Evaluation   Exercise Goals Review  Increase Physical Activity;Increase Strength and Stamina;Understanding of Exercise Prescription  Increase Physical Activity;Increase Strength and Stamina;Understanding of Exercise Prescription  Increase Physical Activity;Increase Strength and Stamina;Able to understand and use rate of perceived exertion (RPE) scale;Able to understand and use Dyspnea scale;Knowledge and understanding of Target Heart Rate Range (THRR);Able to check pulse independently;Understanding of Exercise Prescription  Increase Physical Activity;Increase Strength and Stamina;Able to understand and use Dyspnea scale;Understanding of Exercise Prescription  Increase Physical Activity;Increase Strength and Stamina;Understanding of Exercise Prescription   Comments  Gordon Cameron reports continuing to do well at home. He has been slowly increasing activity, but weather has gotten in the way. He stays busy around the house with his wife as company  Gordon Cameron continues to do well at home.  He is walking some.  He feels that he has improved since his first session in rehab and feels that he has gotten stronger.  He is feeling good overall.  Gordon Cameron has done well since returning to in person sessions.  He works at at [place] [employer].  Staff will monitor  progress  Gordon Cameron wants to be able to walk faster and go longer distances. He wants to go up in weights on his strength training. He is only able to eat soft foods and is not eating the best since he needs oral surgery.  Gordon Cameron is doing well in rehab.  His attendance has been lacking recently due to his oral surgery needs.  He wants to keep coming.  He is up to 2 METs on the BioStep.  We will continue to monitor his progress.   Expected Outcomes  Short: add an extra day of intentional exercise. Long: develop exercise routine that is sustainable.  Short:Continue to make time to exercise. Long: Continue to build stamina.  Short - work towards target HR range Long - build stamina  Short: get oral surgery to maintain nutrtion to improve strength and staminia. Long: maintain exercise and increase work loads.  Short: Attend rehab regularly again.  Long: Continue to exericse on his own at home on off days.   Gordon Cameron Name 11/02/18 1023 11/04/18 1609 11/10/18 1445 11/24/18 1227 12/02/18 1548     Exercise Goal Re-Evaluation   Exercise Goals Review  Increase Physical Activity;Increase Strength and Stamina;Understanding of Exercise Prescription  Increase Physical Activity;Increase Strength and Stamina;Understanding of Exercise Prescription  Increase Physical Activity;Increase Strength and Stamina;Able to understand and use rate of perceived exertion (RPE) scale;Knowledge and understanding of Target Heart Rate Range (THRR);Able to check pulse independently;Understanding of Exercise Prescription  Increase Physical Activity;Increase Strength and Stamina;Understanding of Exercise Prescription  Increase Physical Activity;Increase Strength and Stamina   Comments  Gordon Cameron continues to do well in rehab. He was able to return to his workloads.  We will continue to move up workloads and monitor his progress.  Gordon Cameron is doing well in rehab.  He has increased some workloads.  He has not been doing his home exercise but is staying active by  working around the house.  He is feeling stronger and has some more stamina, he wishes it would recover faster.  We have now added in Mondays for three days a week as well.  Gordon Cameron is not reaching THR range and has been low on RPE at times- staff will encourage him to increase levels to reach these goals  Gordon Cameron is doing well in rehab.  He is enjoying getting back to three days a week.  His sats have been good.  He should be ready to increase workloads.  We will continue to monitor his progress.  Gordon Cameron is getting out and walking at home. He does some work in his garage for exercise. Sometimes he gets his weights out and does a little exercise with them. He gets outside at least every other day.   Expected Outcomes  Short: Increase workloads.  Long: Continue to increase stamina.  Short: Start coming three days a week.  Long: Continue to work on stamina.  Short - work in [employer] range and RPE 11-13 each visit Long - increase overall MET level  --  Short: continue to exercise to improve shortness of breath. Long: maintain exercise to keep shortness of breath at a minimum.   Gordon Cameron Name 12/09/18 1624 12/23/18 0914           Exercise Goal Re-Evaluation   Exercise Goals Review  Increase Physical Activity;Increase Strength and Stamina;Able to understand and use rate of perceived exertion (RPE) scale;Able to understand and use Dyspnea scale;Able to check pulse independently;Knowledge and understanding of Target Heart Rate Range (THRR);Understanding of Exercise Prescription  Increase Physical Activity;Increase Strength and Stamina;Understanding of Exercise Prescription      Comments  Gordon Cameron has added speed to TM and up to Level 3  on T5.  His oxygen stays above 90 most of the time.  Gordon Cameron  is nearing graduation and will be doing his post 6MWT soon.  He at 22 watts on the bike.  We will continue to monitor his progress.      Expected Outcomes  Short - continue to attend consistently Long - maintain exercise on his own   Short: Improve post 6MWT.  Long: Continue to improve stamina.         Nutrition & Weight - Outcomes:  Post Biometrics - 12/23/18 1604  Post  Biometrics   Height  _0  (1.727 m)    Weight  196 lb 12.8 oz (89.3 kg)    BMI (Calculated)  29.93    Single Leg Stand  2.75 seconds       Nutrition:   Nutrition Discharge:   Education Questionnaire Score:   Goals reviewed with patient; copy given to patient.

## 2019-01-07 ENCOUNTER — Telehealth: Payer: Self-pay | Admitting: Internal Medicine

## 2019-01-07 NOTE — Telephone Encounter (Signed)
Spoke to pt, who stated that he has not received copy of sleep study as of yet. Per our records sleep study was mailed to address on file on 12/31/2018. Pt is aware that mail is delayed due to covid and the holidays. Advised pt to contact our office next week if not received by then. Nothing further is needed.

## 2019-01-10 ENCOUNTER — Telehealth: Payer: Self-pay | Admitting: Internal Medicine

## 2019-01-10 NOTE — Telephone Encounter (Signed)
Sleep study has been printed and placed up front for pickup. Pt is aware and voiced his understanding.  Nothing further is needed.

## 2019-01-24 ENCOUNTER — Telehealth: Payer: Self-pay | Admitting: Internal Medicine

## 2019-01-24 DIAGNOSIS — G4733 Obstructive sleep apnea (adult) (pediatric): Secondary | ICD-10-CM

## 2019-01-24 NOTE — Telephone Encounter (Signed)
Called and spoke to pt, who stated that he reviewed sleep study and he would like to proceed with ASV.  Order for ASV has been placed to [place]. Nothing further is needed.   Will route to Dr. Mortimer Fries as an Mitzie Na, Leanna Sato, MD to [facility] Triage Pool    9:29 AM Note   His titration study showed persistent central apneas in spite of higher pressure and CPAP and use of BiPAP up to pressures of 14/10 If we want to fix this, he likely needs a different kind of machine called ASV Suggest schedule appointment with sleep provider at [facility] or here to discuss further

## 2019-01-25 ENCOUNTER — Telehealth: Payer: Self-pay | Admitting: Internal Medicine

## 2019-01-25 NOTE — Telephone Encounter (Signed)
Gordon Cameron, can you help with this? it was my understanding that RT would adjust ASV.

## 2019-01-26 NOTE — Telephone Encounter (Signed)
Per Dr. Bari Mantis note that an appointment with sleep physician either here or in [place] will be the best option for this patient. Rhonda J Cobb

## 2019-01-26 NOTE — Telephone Encounter (Signed)
Lm for pt

## 2019-01-26 NOTE — Telephone Encounter (Signed)
Pt has been scheduled for OV with Dr. Halford Chessman on 02/01/2019 at 10:00 to discuss AVS.  Will route to Dr. Halford Chessman to make aware of visit, as [facility] has recommended that pt have AVS titration.  Nothing further is needed.

## 2019-02-01 ENCOUNTER — Ambulatory Visit (INDEPENDENT_AMBULATORY_CARE_PROVIDER_SITE_OTHER): Payer: Medicare Other | Admitting: Pulmonary Disease

## 2019-02-01 ENCOUNTER — Encounter: Payer: Self-pay | Admitting: Pulmonary Disease

## 2019-02-01 ENCOUNTER — Other Ambulatory Visit: Payer: Self-pay

## 2019-02-01 DIAGNOSIS — G4733 Obstructive sleep apnea (adult) (pediatric): Secondary | ICD-10-CM

## 2019-02-01 DIAGNOSIS — J90 Pleural effusion, not elsewhere classified: Secondary | ICD-10-CM | POA: Diagnosis not present

## 2019-02-01 NOTE — Patient Instructions (Signed)
Will arrange for mask refitting with [facility]  Will arrange for chest xray to be done at [facility]  Follow up with Dr. Halford Chessman in [place] office in one month

## 2019-02-01 NOTE — Progress Notes (Signed)
[facility]  Chief Complaint  Patient presents with  . Follow-up    F/U sleep study    Constitutional:  There were no vitals taken for this visit.  Deferred.  Past Medical History:  RLS, Pericardial effusion, HLD, BPH  Brief Summary:  Gordon Cameron is a 85 y.o. male with obstructive sleep apnea and asthma.  Virtual Visit via Telephone Note  I connected with Gordon Cameron on 02/01/19 at 10:00 AM EST by telephone and verified that I am speaking with the correct person using two identifiers.  Location: Patient: home Provider: medical office   I discussed the limitations, risks, security and privacy concerns of performing an evaluation and management service by telephone and the availability of in person appointments. I also discussed with the patient that there may be a patient responsible charge related to this service. The patient expressed understanding and agreed to proceed.  He was seen by Dr. Ashby Dawes in August.  Download showed increased central events.  He eventually had Bipap titration study done, but still had central apneas.  He was advised to have ASV titration, but not done yet.  He had his teeth pulled and is now wearing dentures.  He had f/u Echo done earlier this month.  Report shows pericardial effusion.  He has not had recent chest xray.  Physical Exam:   Deferred.   Assessment/Plan:   Pleural effusion. - noted on recent Echo report - will arrange for chest xray to further assess  Mild, intermittent asthma. - much of his previous symptoms likely from pulmonary edema in setting of pericardial effusion - advair causes hoarseness - prn ventolin  Obstructive sleep apnea with treatment emergent central apnea. - mask fit more of an issue since he had teeth removed and is wearing dentures >> will refit his mask to [employer] - will need to further assess pleural effusion  - defer ASV titration for  now  Pericardial effusion. - has f/u with cardiology later this week   Patient Instructions  Will arrange for mask refitting with [facility]  Will arrange for chest xray to be done at [facility]  Follow up with Dr. Halford Cameron in [place] office in one month   I discussed the assessment and treatment plan with the patient. The patient was provided an opportunity to ask questions and all were answered. The patient agreed with the plan and demonstrated an understanding of the instructions.   The patient was advised to call back or seek an in-person evaluation if the symptoms worsen or if the condition fails to improve as anticipated.  I provided 24 minutes of non-face-to-face time during this encounter.   [name], MD [facility] Pager: [phone] 02/01/2019, 10:48 AM  Flow Sheet     Pulmonary tests:  CT angio chest 06/23/17 >> mod pericardial effusion PFT 07/03/17 >> FEV1 1.62 (64%), FEV1% 74, TLC 4.84 (73%), DLCO 41% Serology 07/07/17 >> ANA positive, 1:40, homogeneous pattern  Sleep tests:  CPAP titration 06/17/06 ([facility]) >> CPAP 15 cm H2O. Auto CPAP 11/11/17 to 02/08/18 >> used on 69 of 90 nights with average 7 hrs.  Average AHI 12.9 with median CPAP 11 and 95 th percentile CPAP 13 cm H2O   Cardiac tests:  Echo 06/25/17 >> EF 50%, mild AR/MR, mild effusion, mild LVH Stress test 06/25/17 >> nl LV fx, nl wall motion  Medications:   Allergies as of 02/01/2019   No Known Allergies  Medication List       Accurate as of February 01, 2019 10:48 AM. If you have any questions, ask your nurse or doctor.        STOP taking these medications   cetirizine 10 MG tablet Commonly known as: ZYRTEC Stopped by: [name], MD   Caprice Renshaw Pressair 400 MCG/ACT Aepb Generic drug: Aclidinium Bromide Stopped by: [name], MD     TAKE these medications   albuterol 108 (90 Base) MCG/ACT inhaler Commonly known as: Ventolin HFA Inhale 2 puffs into the  lungs every 6 (six) hours as needed for wheezing or shortness of breath.   finasteride 5 MG tablet Commonly known as: PROSCAR Once a day   fluticasone 50 MCG/ACT nasal spray Commonly known as: FLONASE Place 2 sprays into both nostrils daily.   furosemide 20 MG tablet Commonly known as: LASIX Take 160 mg by mouth daily.   spironolactone 50 MG tablet Commonly known as: ALDACTONE Take 50 mg by mouth daily.   spironolactone 25 MG tablet Commonly known as: ALDACTONE Take 25 mg by mouth daily.       Past Surgical History:  He  has a past surgical history that includes Neck surgery; Back surgery; Elbow surgery (Right); and Colonoscopy with propofol (N/A, 01/28/2017).  Family History:  His family history includes Colon cancer in his brother, brother, and maternal grandmother.  Social History:  He  reports that he has never smoked. He has never used smokeless tobacco. He reports that he does not drink alcohol or use drugs.

## 2019-02-03 ENCOUNTER — Other Ambulatory Visit: Payer: Self-pay | Admitting: Cardiology

## 2019-02-03 DIAGNOSIS — R188 Other ascites: Secondary | ICD-10-CM

## 2019-02-07 ENCOUNTER — Ambulatory Visit
Admission: RE | Admit: 2019-02-07 | Discharge: 2019-02-07 | Disposition: A | Discharge status: Home / Self Care | Payer: Medicare Other | Source: Ambulatory Visit | Attending: Cardiology | Admitting: Cardiology

## 2019-02-07 ENCOUNTER — Other Ambulatory Visit: Payer: Self-pay

## 2019-02-07 DIAGNOSIS — R188 Other ascites: Secondary | ICD-10-CM | POA: Insufficient documentation

## 2019-02-07 LAB — BODY FLUID CELL COUNT WITH DIFFERENTIAL
Eos, Fluid: 0 %
Lymphs, Fluid: 52 %
Monocyte-Macrophage-Serous Fluid: 44 %
Neutrophil Count, Fluid: 4 %
Total Nucleated Cell Count, Fluid: 211 cu mm

## 2019-02-07 LAB — ALBUMIN, PLEURAL OR PERITONEAL FLUID: Albumin, Fluid: 2.3 g/dL

## 2019-02-07 MED ORDER — ALBUMIN HUMAN 25 % IV SOLN: 25.0000 g | Freq: Once | INTRAVENOUS | Status: DC

## 2019-02-09 LAB — CYTOLOGY - NON PAP

## 2019-02-11 LAB — BODY FLUID CULTURE: Culture: NO GROWTH

## 2019-02-16 ENCOUNTER — Other Ambulatory Visit: Payer: Self-pay | Admitting: Gastroenterology

## 2019-02-16 DIAGNOSIS — R188 Other ascites: Secondary | ICD-10-CM

## 2019-02-17 ENCOUNTER — Other Ambulatory Visit: Payer: Self-pay | Admitting: Gastroenterology

## 2019-02-17 DIAGNOSIS — R188 Other ascites: Secondary | ICD-10-CM

## 2019-02-18 ENCOUNTER — Other Ambulatory Visit: Payer: Self-pay | Admitting: Gastroenterology

## 2019-02-18 DIAGNOSIS — R188 Other ascites: Secondary | ICD-10-CM

## 2019-02-18 DIAGNOSIS — R1084 Generalized abdominal pain: Secondary | ICD-10-CM

## 2019-02-21 ENCOUNTER — Ambulatory Visit
Admission: RE | Admit: 2019-02-21 | Discharge: 2019-02-21 | Disposition: A | Discharge status: Home / Self Care | Payer: Medicare Other | Source: Ambulatory Visit | Attending: Gastroenterology | Admitting: Gastroenterology

## 2019-02-21 ENCOUNTER — Other Ambulatory Visit: Payer: Self-pay

## 2019-02-21 DIAGNOSIS — R1084 Generalized abdominal pain: Secondary | ICD-10-CM | POA: Diagnosis present

## 2019-02-21 DIAGNOSIS — R188 Other ascites: Secondary | ICD-10-CM | POA: Diagnosis not present

## 2019-02-21 LAB — BODY FLUID CELL COUNT WITH DIFFERENTIAL
Eos, Fluid: 0 %
Lymphs, Fluid: 60 %
Monocyte-Macrophage-Serous Fluid: 32 %
Neutrophil Count, Fluid: 8 %
Total Nucleated Cell Count, Fluid: 587 cu mm

## 2019-02-21 LAB — ALBUMIN, PLEURAL OR PERITONEAL FLUID: Albumin, Fluid: 2 g/dL

## 2019-02-21 LAB — PROTEIN, PLEURAL OR PERITONEAL FLUID: Total protein, fluid: 3.2 g/dL

## 2019-02-21 LAB — ALBUMIN: Albumin: 3.5 g/dL (ref 3.5–5.0)

## 2019-02-21 LAB — SODIUM: Sodium: 135 mmol/L (ref 135–145)

## 2019-02-21 MED ORDER — ALBUMIN HUMAN 25 % IV SOLN
INTRAVENOUS | Status: AC
  Administered 2019-02-21: 10:00:00 25 g
  Filled 2019-02-21: qty 200

## 2019-02-21 MED ORDER — ALBUMIN HUMAN 25 % IV SOLN
25.0000 g | Freq: Once | INTRAVENOUS | Status: AC
  Administered 2019-02-21: 11:00:00 25 g via INTRAVENOUS

## 2019-02-21 MED ORDER — ALBUMIN HUMAN 25 % IV SOLN: 25.0000 g | Freq: Once | INTRAVENOUS | Status: AC

## 2019-02-21 NOTE — Procedures (Signed)
PROCEDURE SUMMARY:  Successful US guided paracentesis from left lateral abdomen.  Yielded 8 liters of clear yellow fluid.  No immediate complications.  Patient tolerated well.  EBL = trace  Kale Dols S Rever Pichette PA-C 02/21/2019 10:11 AM

## 2019-02-22 LAB — CYTOLOGY - NON PAP

## 2019-02-23 ENCOUNTER — Ambulatory Visit: Payer: Medicare Other

## 2019-02-25 LAB — BODY FLUID CULTURE: Culture: NO GROWTH

## 2019-03-17 ENCOUNTER — Ambulatory Visit (INDEPENDENT_AMBULATORY_CARE_PROVIDER_SITE_OTHER): Payer: Medicare Other | Admitting: Pulmonary Disease

## 2019-03-17 ENCOUNTER — Encounter: Payer: Self-pay | Admitting: Pulmonary Disease

## 2019-03-17 ENCOUNTER — Other Ambulatory Visit: Payer: Self-pay

## 2019-03-17 VITALS — BP 120/68 | HR 74 | Temp 97.3°F | Ht 68.0 in | Wt 172.4 lb

## 2019-03-17 DIAGNOSIS — G4733 Obstructive sleep apnea (adult) (pediatric): Secondary | ICD-10-CM | POA: Diagnosis not present

## 2019-03-17 DIAGNOSIS — J4520 Mild intermittent asthma, uncomplicated: Secondary | ICD-10-CM

## 2019-03-17 DIAGNOSIS — G4731 Primary central sleep apnea: Secondary | ICD-10-CM

## 2019-03-17 NOTE — Patient Instructions (Signed)
Follow up in 6 months 

## 2019-03-17 NOTE — Progress Notes (Signed)
[facility], Critical Care, and Sleep Medicine  Chief Complaint  Patient presents with  . Follow-up    wearing cpap avg 7.5-8hr nightly- feels pressure and mask are okay. XAJ:OINOMVE.    Constitutional:  BP 120/68 (BP Location: Left Arm, Cuff Size: Normal)   Pulse 74   Temp (!) 97.3 F (36.3 C) (Temporal)   Ht 5' 8"  (1.727 m)   Wt 172 lb 6.4 oz (78.2 kg)   SpO2 99%   BMI 26.21 kg/m    Past Medical History:  RLS, Pericardial effusion, HLD, BPH, NASH with cirrhosis  Brief Summary:  Gordon Cameron is a 85 y.o. male with obstructive sleep apnea and asthma.  Since his last visit he was seen by cardiology.  Found to have ascites.  Then seen by GI.  Had paracentesis x 2.  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  Since he had paracentesis and adjustment in medication his breathing has improved.  Not having cough, wheeze, or sputum.  Has needed albuterol recently.  Got new CPAP mask and fits much better.  He is sleeping well, and feels more rested during the day.  Feels that pressure setting is comfortable.   Physical Exam:   Appearance - well kempt   ENMT - no sinus tenderness, no nasal discharge, no oral exudate  Neck - no masses, trachea midline, no thyromegaly, no elevation in JVP  Respiratory - normal appearance of chest wall, normal respiratory effort w/o accessory muscle use, no dullness on percussion, no wheezing or rales  CV - s1s2 regular rate and rhythm, no murmurs, no peripheral edema, radial pulses symmetric  GI - soft, non tender, mild distention  Lymph - no adenopathy noted in neck and axillary areas  Ext - no cyanosis, clubbing, or joint inflammation noted  Psych - normal mood and affect    Assessment/Plan:   Obstructive sleep apnea with treatment emergent Gordon apnea. - he is compliant with CPAP and reports benefit - mask fit much better - I suspect his Gordon apneas were related to fluid retention in setting of CHF and  cirrhosis  - sleep pattern much improved since he had therapy for CHF and cirrhosis - continue auto CPAP  Mild, intermittent asthma. - hoarseness from advair - prn albuterol  NASH with cirrhosis. - he has f/u with Dr. Alice Cameron with [facility]  Chronic diastolic congestive heart failure. - he has f/u with Dr. Saralyn Cameron with [facility]  Pleural effusion. - likely from CHF and cirrhosis - clinically improved   Patient Instructions  Follow up in 6 months   Time spent 34 minutes   Gordon Mires, MD [place] Pager: [phone] 03/17/2019, 11:59 AM  Flow Sheet     [facility] tests:  CT angio chest 06/23/17 >> mod pericardial effusion PFT 07/03/17 >> FEV1 1.62 (64%), FEV1% 74, TLC 4.84 (73%), DLCO 41% Serology 07/07/17 >> ANA positive, 1:40, homogeneous pattern  Sleep tests:  CPAP titration 06/17/06 ([facility] Cameron) >> CPAP 15 cm H2O. Auto CPAP 12/25/18 to 02/25/19 >> used on 62 of 63 nights with average 7 hrs 11 min.  Average AHI 21 with CPAP 13 cm H2O  Cardiac tests:  Echo 06/25/17 >> EF 50%, mild AR/MR, mild effusion, mild LVH Stress test 06/25/17 >> nl LV fx, nl wall motion  Medications:   Allergies as of 03/17/2019   No Known Allergies     Medication List       Accurate as of March 17, 2019 11:59  AM. If you have any questions, ask your nurse or doctor.        albuterol 108 (90 Base) MCG/ACT inhaler Commonly known as: Ventolin HFA Inhale 2 puffs into the lungs every 6 (six) hours as needed for wheezing or shortness of breath.   finasteride 5 MG tablet Commonly known as: PROSCAR Once a day   fluticasone 50 MCG/ACT nasal spray Commonly known as: FLONASE Place 2 sprays into both nostrils daily.   furosemide 20 MG tablet Commonly known as: LASIX Take 160 mg by mouth daily.   spironolactone 50 MG tablet Commonly known as: ALDACTONE Take 100 mg by mouth daily.       Past Surgical History:  He  has a past  surgical history that includes Neck surgery; Back surgery; Elbow surgery (Right); and Colonoscopy with propofol (N/A, 01/28/2017).  Family History:  His family history includes Colon cancer in his brother, brother, and maternal grandmother.  Social History:  He  reports that he has never smoked. He has never used smokeless tobacco. He reports that he does not drink alcohol or use drugs.

## 2019-03-21 NOTE — Progress Notes (Signed)
Patient ID: Gordon Cameron, male    DOB: 1934/01/30, 85 y.o.   MRN: [id number]  HPI  Gordon Cameron is a 85 y/o male with a history of hyperlipidemia, obstructive sleep apnea, nonalcoholic liver cirrhosis and chronic heart failure.   Echo report from 01/25/19 reviewed and showed an EF of 50% along with mild Gordon & TR.   He has not been admitted or been in the ED in the last 6 months.   He presents today for his initial visit at the request of his cardiologist. He presents with a chief complaint of minimal shortness of breath upon moderate exertion. He describes this as chronic in nature having been present for the last couple of years. He has associated fatigue, pedal edema and abdominal distention along with this. He denies any difficulty sleeping, dizziness, palpitations, chest pain, cough or weight gain.   He says that he has a standing order for paracentesis if he gains 10 pounds and abdominal distention worsens.   Past Medical History:  Diagnosis Date  . BPH (benign prostatic hyperplasia)   . CHF (congestive heart failure) ([place])   . Hyperlipidemia   . Liver cirrhosis secondary to NASH (nonalcoholic steatohepatitis) ([place])   . OSA (obstructive sleep apnea)   . Reactive airway disease   . Restless leg    Past Surgical History:  Procedure Laterality Date  . BACK SURGERY     2001  . COLONOSCOPY WITH PROPOFOL N/A 01/28/2017   Procedure: COLONOSCOPY WITH PROPOFOL;  Surgeon: Manya Silvas, MD;  Location: [facility] ENDOSCOPY;  Service: Endoscopy;  Laterality: N/A;  . ELBOW SURGERY Right   . NECK SURGERY     Family History  Problem Relation Age of Onset  . Colon cancer Maternal Grandmother   . Colon cancer Brother   . Colon cancer Brother    Social History   Tobacco Use  . Smoking status: Never Smoker  . Smokeless tobacco: Never Used  Substance Use Topics  . Alcohol use: No    Alcohol/week: 0.0 standard drinks   No Known Allergies Prior to Admission medications   Medication Sig  Start Date End Date Taking? Authorizing Provider  albuterol (VENTOLIN HFA) 108 (90 Base) MCG/ACT inhaler Inhale 2 puffs into the lungs every 6 (six) hours as needed for wheezing or shortness of breath. 08/19/17  Yes [name], MD  finasteride (PROSCAR) 5 MG tablet Once a day 05/25/11  Yes [provider]  fluticasone (FLONASE) 50 MCG/ACT nasal spray Place 2 sprays into both nostrils daily.   Yes [provider]  furosemide (LASIX) 80 MG tablet Take 160 mg by mouth daily.  06/29/17  Yes [provider]  spironolactone (ALDACTONE) 50 MG tablet Take 100 mg by mouth daily.    Yes [provider]    Review of Systems  Constitutional: Positive for fatigue (minimal). Negative for appetite change.  HENT: Negative for congestion, postnasal drip and sore throat.   Eyes: Negative.   Respiratory: Positive for shortness of breath (with moderate exertion). Negative for cough.   Cardiovascular: Positive for leg swelling. Negative for chest pain and palpitations.  Gastrointestinal: Positive for abdominal distention ("much improved"). Negative for abdominal pain.  Endocrine: Negative.   Genitourinary: Negative.   Musculoskeletal: Negative for back pain and neck pain.  Skin: Negative.   Allergic/Immunologic: Negative.   Neurological: Negative for dizziness and light-headedness.  Hematological: Negative for adenopathy. Does not bruise/bleed easily.  Psychiatric/Behavioral: Negative for dysphoric mood and sleep disturbance (wearing CPAP). The patient  is not nervous/anxious.    Vitals:   03/22/19 0819  BP: 117/78  Pulse: (!) 52  Resp: 16  SpO2: 99%  Weight: 172 lb 6.4 oz (78.2 kg)  Height: 5\' 8"  (1.727 m)   Wt Readings from Last 3 Encounters:  03/22/19 172 lb 6.4 oz (78.2 kg)  03/17/19 172 lb 6.4 oz (78.2 kg)  12/23/18 196 lb 12.8 oz (89.3 kg)   Lab Results  Component Value Date   CREATININE 0.90 07/06/2017   CREATININE 0.90 06/23/2017     Physical  Exam Vitals and nursing note reviewed.  Constitutional:      Appearance: He is well-developed.  HENT:     Head: Normocephalic and atraumatic.  Neck:     Vascular: No JVD.  Cardiovascular:     Rate and Rhythm: Regular rhythm. Bradycardia present.  Pulmonary:     Effort: Pulmonary effort is normal. No respiratory distress.     Breath sounds: No wheezing or rales.  Abdominal:     General: There is distension.     Palpations: Abdomen is soft.  Musculoskeletal:        General: No tenderness.     Cervical back: Normal range of motion and neck supple.     Right lower leg: Edema (2+ pitting) present.     Left lower leg: Edema (2+ pitting) present.  Skin:    General: Skin is warm and dry.  Neurological:     General: No focal deficit present.     Mental Status: He is alert and oriented to person, place, and time.  Psychiatric:        Mood and Affect: Mood normal.        Behavior: Behavior normal.        Thought Content: Thought content normal.     Assessment & Plan:  1: Chronic heart failure with preserved ejection fraction- - NYHA class II - euvolemic today - weighing daily and home weight chart was reviewed; weight has declined since recent paracentesis - reminded to call for an overnight weight gain of >2 pounds or a weekly weight gain of >5 pounds - not adding salt and they are diligent about reading food labels for sodium content; written dietary information and low sodium cookbook were given to patient and his wife - saw cardiology Gordon Cameron) 02/02/19 - paracentesis done 02/21/19 with removal of 8L - BNP 07/06/17 was 200.0 - reports receiving his flu vaccine last year - discussed possibly changing his furosemide to torsemide at his next visit  2: Obstructive sleep apnea- - wearing CPAP nightly - saw pulmonology Gordon Cameron) 03/17/19 - BMP from 03/09/19 reviewed and showed sodium 137, potassium 4.2, creatinine 1.1 and GFR 64  3: Liver cirrhosis not related to alcohol-  - saw PCP  Gordon Cameron) 03/09/19 - saw GI Gordon Cameron) 02/24/19 - has EGD scheduled for May 2021 - has standing paracentesis order  4: Lymphedema- - stage 2 - not elevating legs consistently and he was encouraged to elevate them when sitting for long periods of time - does wear compression socks but not consistently; encouraged him to wear them daily with removal at bedtime - is doing some exercising at home - should edema persist, consider lymphapress compression boots; brochure given to patient  Medication list was reviewed.   Return in 5 weeks or sooner for any questions/problems before then.

## 2019-03-22 ENCOUNTER — Other Ambulatory Visit: Payer: Self-pay

## 2019-03-22 ENCOUNTER — Encounter: Payer: Self-pay | Admitting: Family

## 2019-03-22 ENCOUNTER — Ambulatory Visit: Payer: Medicare Other | Attending: Family | Admitting: Family

## 2019-03-22 VITALS — BP 117/78 | HR 52 | Resp 16 | Ht 68.0 in | Wt 172.4 lb

## 2019-03-22 DIAGNOSIS — J45909 Unspecified asthma, uncomplicated: Secondary | ICD-10-CM | POA: Insufficient documentation

## 2019-03-22 DIAGNOSIS — N400 Enlarged prostate without lower urinary tract symptoms: Secondary | ICD-10-CM | POA: Insufficient documentation

## 2019-03-22 DIAGNOSIS — I509 Heart failure, unspecified: Secondary | ICD-10-CM | POA: Diagnosis present

## 2019-03-22 DIAGNOSIS — E785 Hyperlipidemia, unspecified: Secondary | ICD-10-CM | POA: Diagnosis not present

## 2019-03-22 DIAGNOSIS — G2581 Restless legs syndrome: Secondary | ICD-10-CM | POA: Insufficient documentation

## 2019-03-22 DIAGNOSIS — Z79899 Other long term (current) drug therapy: Secondary | ICD-10-CM | POA: Insufficient documentation

## 2019-03-22 DIAGNOSIS — K7460 Unspecified cirrhosis of liver: Secondary | ICD-10-CM | POA: Diagnosis not present

## 2019-03-22 DIAGNOSIS — Z800 Family history of malignant neoplasm of digestive organs: Secondary | ICD-10-CM | POA: Insufficient documentation

## 2019-03-22 DIAGNOSIS — G4733 Obstructive sleep apnea (adult) (pediatric): Secondary | ICD-10-CM | POA: Insufficient documentation

## 2019-03-22 DIAGNOSIS — I5032 Chronic diastolic (congestive) heart failure: Secondary | ICD-10-CM

## 2019-03-22 DIAGNOSIS — I890 Lymphedema, not elsewhere classified: Secondary | ICD-10-CM | POA: Diagnosis not present

## 2019-03-22 DIAGNOSIS — K7581 Nonalcoholic steatohepatitis (NASH): Secondary | ICD-10-CM | POA: Diagnosis not present

## 2019-03-22 NOTE — Patient Instructions (Signed)
Continue weighing daily and call for an overnight weight gain of > 2 pounds or a weekly weight gain of >5 pounds. 

## 2019-04-15 ENCOUNTER — Other Ambulatory Visit: Payer: Self-pay | Admitting: Gastroenterology

## 2019-04-15 DIAGNOSIS — K7460 Unspecified cirrhosis of liver: Secondary | ICD-10-CM

## 2019-04-15 DIAGNOSIS — R188 Other ascites: Secondary | ICD-10-CM

## 2019-04-19 ENCOUNTER — Ambulatory Visit: Admission: RE | Admit: 2019-04-19 | Payer: Medicare Other | Source: Ambulatory Visit

## 2019-04-19 ENCOUNTER — Other Ambulatory Visit: Payer: Self-pay

## 2019-04-19 ENCOUNTER — Other Ambulatory Visit
Admission: RE | Admit: 2019-04-19 | Discharge: 2019-04-19 | Disposition: A | Discharge status: Home / Self Care | Payer: Medicare Other | Source: Ambulatory Visit | Attending: Gastroenterology | Admitting: Gastroenterology

## 2019-04-19 ENCOUNTER — Ambulatory Visit
Admission: RE | Admit: 2019-04-19 | Discharge: 2019-04-19 | Disposition: A | Discharge status: Home / Self Care | Payer: Medicare Other | Source: Ambulatory Visit | Attending: Gastroenterology | Admitting: Gastroenterology

## 2019-04-19 DIAGNOSIS — R188 Other ascites: Secondary | ICD-10-CM | POA: Insufficient documentation

## 2019-04-19 DIAGNOSIS — K7460 Unspecified cirrhosis of liver: Secondary | ICD-10-CM | POA: Diagnosis not present

## 2019-04-19 LAB — ALBUMIN, PLEURAL OR PERITONEAL FLUID: Albumin, Fluid: 2.3 g/dL

## 2019-04-19 LAB — PROTEIN, PLEURAL OR PERITONEAL FLUID: Total protein, fluid: 3.6 g/dL

## 2019-04-19 LAB — ALBUMIN: Albumin: 3.8 g/dL (ref 3.5–5.0)

## 2019-04-19 LAB — BODY FLUID CELL COUNT WITH DIFFERENTIAL
Eos, Fluid: 0 %
Lymphs, Fluid: 62 %
Monocyte-Macrophage-Serous Fluid: 33 %
Neutrophil Count, Fluid: 5 %
Total Nucleated Cell Count, Fluid: 229 cu mm

## 2019-04-19 LAB — SODIUM: Sodium: 133 mmol/L — ABNORMAL LOW (ref 135–145)

## 2019-04-19 MED ORDER — ALBUMIN HUMAN 25 % IV SOLN
INTRAVENOUS | Status: AC
  Administered 2019-04-19: 25 g via INTRAVENOUS
  Filled 2019-04-19: qty 100

## 2019-04-19 MED ORDER — ALBUMIN HUMAN 25 % IV SOLN: 25.0000 g | Freq: Once | INTRAVENOUS | Status: AC

## 2019-04-19 NOTE — Procedures (Signed)
PROCEDURE SUMMARY:  Successful image-guided paracentesis from the left lower abdomen.  Yielded 8.5 liters of amber fluid.  No immediate complications.  EBL: zero Patient tolerated well.   Specimen was not sent for labs.  Please see imaging section of Epic for full dictation.  Joaquim Nam PA-C 04/19/2019 2:55 PM

## 2019-04-21 LAB — CYTOLOGY - NON PAP

## 2019-04-22 LAB — BODY FLUID CULTURE: Culture: NO GROWTH

## 2019-04-25 ENCOUNTER — Other Ambulatory Visit: Payer: Self-pay

## 2019-04-25 ENCOUNTER — Ambulatory Visit: Payer: Medicare Other | Attending: Family | Admitting: Family

## 2019-04-25 ENCOUNTER — Encounter: Payer: Self-pay | Admitting: Family

## 2019-04-25 VITALS — BP 104/74 | HR 70 | Resp 15 | Ht 68.0 in | Wt 161.5 lb

## 2019-04-25 DIAGNOSIS — E785 Hyperlipidemia, unspecified: Secondary | ICD-10-CM | POA: Diagnosis not present

## 2019-04-25 DIAGNOSIS — I890 Lymphedema, not elsewhere classified: Secondary | ICD-10-CM | POA: Diagnosis not present

## 2019-04-25 DIAGNOSIS — I509 Heart failure, unspecified: Secondary | ICD-10-CM | POA: Diagnosis not present

## 2019-04-25 DIAGNOSIS — Z79899 Other long term (current) drug therapy: Secondary | ICD-10-CM | POA: Insufficient documentation

## 2019-04-25 DIAGNOSIS — G4733 Obstructive sleep apnea (adult) (pediatric): Secondary | ICD-10-CM | POA: Diagnosis not present

## 2019-04-25 DIAGNOSIS — I5032 Chronic diastolic (congestive) heart failure: Secondary | ICD-10-CM

## 2019-04-25 DIAGNOSIS — K7460 Unspecified cirrhosis of liver: Secondary | ICD-10-CM | POA: Diagnosis not present

## 2019-04-25 DIAGNOSIS — K7581 Nonalcoholic steatohepatitis (NASH): Secondary | ICD-10-CM

## 2019-04-25 DIAGNOSIS — N400 Enlarged prostate without lower urinary tract symptoms: Secondary | ICD-10-CM | POA: Insufficient documentation

## 2019-04-25 DIAGNOSIS — G2581 Restless legs syndrome: Secondary | ICD-10-CM | POA: Insufficient documentation

## 2019-04-25 NOTE — Progress Notes (Signed)
Patient ID: Gordon Cameron, male    DOB: [birth year], 85 y.o.   MRN: [id number]  HPI  Gordon Cameron is a 85 y/o male with a history of hyperlipidemia, obstructive sleep apnea, nonalcoholic liver cirrhosis and chronic heart failure.   Echo report from 01/25/19 reviewed and showed an EF of 50% along with mild Gordon & TR.   He has not been admitted or been in the ED in the last 6 months.   He presents today for a follow-up visit with a chief complaint of minimal fatigue upon moderate exertion. He describes this as chronic in nature having been present for several years. He has associated shortness of breath, pedal edema and abdominal distention along with this. He denies any difficulty sleeping, palpitations, chest pain, dizziness or cough.   He says that he has a standing order for paracentesis if he gains 10 pounds and abdominal distention worsens.   Past Medical History:  Diagnosis Date  . BPH (benign prostatic hyperplasia)   . CHF (congestive heart failure) ([place])   . Hyperlipidemia   . Liver cirrhosis secondary to NASH (nonalcoholic steatohepatitis) ([place])   . OSA (obstructive sleep apnea)   . Reactive airway disease   . Restless leg    Past Surgical History:  Procedure Laterality Date  . BACK SURGERY     2001  . COLONOSCOPY WITH PROPOFOL N/A 01/28/2017   Procedure: COLONOSCOPY WITH PROPOFOL;  Surgeon: Manya Silvas, MD;  Location: [facility] ENDOSCOPY;  Service: Endoscopy;  Laterality: N/A;  . ELBOW SURGERY Right   . NECK SURGERY     Family History  Problem Relation Age of Onset  . Colon cancer Maternal Grandmother   . Colon cancer Brother   . Colon cancer Brother    Social History   Tobacco Use  . Smoking status: Never Smoker  . Smokeless tobacco: Never Used  Substance Use Topics  . Alcohol use: No    Alcohol/week: 0.0 standard drinks   No Known Allergies  Prior to Admission medications   Medication Sig Start Date End Date Taking? Authorizing Provider  albuterol (VENTOLIN  HFA) 108 (90 Base) MCG/ACT inhaler Inhale 2 puffs into the lungs every 6 (six) hours as needed for wheezing or shortness of breath. 08/19/17  Yes [name], MD  finasteride (PROSCAR) 5 MG tablet Once a day 05/25/11  Yes [provider]  fluticasone (FLONASE) 50 MCG/ACT nasal spray Place 2 sprays into both nostrils daily.   Yes [provider]  furosemide (LASIX) 80 MG tablet Take 160 mg by mouth daily.  06/29/17  Yes [provider]  spironolactone (ALDACTONE) 50 MG tablet Take 100 mg by mouth daily.    Yes [provider]    Review of Systems  Constitutional: Positive for fatigue (minimal). Negative for appetite change.  HENT: Negative for congestion, postnasal drip and sore throat.   Eyes: Negative.   Respiratory: Positive for shortness of breath (with moderate exertion). Negative for cough.   Cardiovascular: Positive for leg swelling. Negative for chest pain and palpitations.  Gastrointestinal: Positive for abdominal distention (stable since paracentesis). Negative for abdominal pain.  Endocrine: Negative.   Genitourinary: Negative.   Musculoskeletal: Negative for back pain and neck pain.  Skin: Negative.   Allergic/Immunologic: Negative.   Neurological: Negative for dizziness and light-headedness.  Hematological: Negative for adenopathy. Does not bruise/bleed easily.  Psychiatric/Behavioral: Negative for dysphoric mood and sleep disturbance (wearing CPAP). The patient is not nervous/anxious.    Vitals:   04/25/19 1302  BP: 104/74  Pulse: 70  Resp: 15  SpO2: 96%  Weight: 161 lb 8 oz (73.3 kg)  Height: 5' 8"  (1.727 m)   Wt Readings from Last 3 Encounters:  04/25/19 161 lb 8 oz (73.3 kg)  04/19/19 170 lb (77.1 kg)  03/22/19 172 lb 6.4 oz (78.2 kg)   Lab Results  Component Value Date   CREATININE 0.90 07/06/2017   CREATININE 0.90 06/23/2017    Physical Exam Vitals and nursing note reviewed.  Constitutional:      Appearance: He is  well-developed.  HENT:     Head: Normocephalic and atraumatic.  Neck:     Vascular: No JVD.  Cardiovascular:     Rate and Rhythm: Normal rate and regular rhythm.  Pulmonary:     Effort: Pulmonary effort is normal. No respiratory distress.     Breath sounds: No wheezing or rales.  Abdominal:     General: There is distension.     Palpations: Abdomen is soft.  Musculoskeletal:        General: No tenderness.     Cervical back: Normal range of motion and neck supple.     Right lower leg: Edema (trace pitting) present.     Left lower leg: Edema (trace pitting) present.  Skin:    General: Skin is warm and dry.  Neurological:     General: No focal deficit present.     Mental Status: He is alert and oriented to person, place, and time.  Psychiatric:        Mood and Affect: Mood normal.        Behavior: Behavior normal.        Thought Content: Thought content normal.     Assessment & Plan:  1: Chronic heart failure with preserved ejection fraction- - NYHA class II - euvolemic today - weighing daily and home weight chart was reviewed - reminded to call for an overnight weight gain of >2 pounds or a weekly weight gain of >5 pounds - weight down 11 pounds from last visit here 1 month ago - not adding salt and they are diligent about reading food labels for sodium content - saw cardiology Margarito Courser) 02/02/19 - BNP 07/06/17 was 200.0  2: Obstructive sleep apnea- - wearing CPAP nightly - saw pulmonology Halford Chessman) 03/17/19 - BMP from 04/08/19 reviewed and showed sodium 133, potassium 3.8, creatinine 1.0 and GFR 72  3: Liver cirrhosis not related to alcohol-  - saw PCP Gordon Cameron) 03/09/19 - saw GI Chauncey Mann) 04/08/19 - has EGD scheduled for May 2021 - paracentesis done 04/19/19 with removal of 8.5L of fluids; has others scheduled for 05/03/19 and 05/17/19 already - has standing paracentesis order  4: Lymphedema- - stage 2 - elevating legs when sitting for long periods of time - wearing  compression socks daily with removal at bedtime - is doing some exercising at home - should edema persist, consider lymphapress compression boots; brochure given to patient    Medication list was reviewed.    Due to stability of HF, will not make a return appointment at this time. Advised patient and his wife that he could call back at anytime to schedule another appointment. Patient was comfortable with this plan.

## 2019-04-25 NOTE — Patient Instructions (Addendum)
Continue weighing daily and call for an overnight weight gain of > 2 pounds or a weekly weight gain of >5 pounds.   Call us in the future if you'd like to make another appointment 

## 2019-05-03 ENCOUNTER — Other Ambulatory Visit: Payer: Self-pay

## 2019-05-03 ENCOUNTER — Other Ambulatory Visit
Admission: RE | Admit: 2019-05-03 | Discharge: 2019-05-03 | Disposition: A | Discharge status: Home / Self Care | Payer: Medicare Other | Source: Home / Self Care | Attending: Gastroenterology | Admitting: Gastroenterology

## 2019-05-03 ENCOUNTER — Ambulatory Visit
Admission: RE | Admit: 2019-05-03 | Discharge: 2019-05-03 | Disposition: A | Discharge status: Home / Self Care | Payer: Medicare Other | Source: Ambulatory Visit | Attending: Gastroenterology | Admitting: Gastroenterology

## 2019-05-03 DIAGNOSIS — R188 Other ascites: Secondary | ICD-10-CM | POA: Insufficient documentation

## 2019-05-03 DIAGNOSIS — K7460 Unspecified cirrhosis of liver: Secondary | ICD-10-CM | POA: Diagnosis present

## 2019-05-03 LAB — ALBUMIN, PLEURAL OR PERITONEAL FLUID: Albumin, Fluid: 2.2 g/dL

## 2019-05-03 LAB — BODY FLUID CELL COUNT WITH DIFFERENTIAL
Eos, Fluid: 0 %
Lymphs, Fluid: 63 %
Monocyte-Macrophage-Serous Fluid: 30 %
Neutrophil Count, Fluid: 7 %
Total Nucleated Cell Count, Fluid: 256 cu mm

## 2019-05-03 LAB — PROTEIN, PLEURAL OR PERITONEAL FLUID: Total protein, fluid: 3.1 g/dL

## 2019-05-03 LAB — SODIUM: Sodium: 134 mmol/L — ABNORMAL LOW (ref 135–145)

## 2019-05-03 LAB — ALBUMIN: Albumin: 3.8 g/dL (ref 3.5–5.0)

## 2019-05-03 NOTE — Procedures (Signed)
Ultrasound-guided diagnostic and therapeutic paracentesis performed yielding 4.6 liters of amber fluid. No immediate complications. A portion of the fluid was sent to the lab for preordered studies. EBL < 1 cc.

## 2019-05-05 LAB — CYTOLOGY - NON PAP

## 2019-05-07 LAB — BODY FLUID CULTURE: Culture: NO GROWTH

## 2019-05-17 ENCOUNTER — Other Ambulatory Visit: Payer: Self-pay

## 2019-05-17 ENCOUNTER — Ambulatory Visit
Admission: RE | Admit: 2019-05-17 | Discharge: 2019-05-17 | Disposition: A | Discharge status: Home / Self Care | Payer: Medicare Other | Source: Ambulatory Visit | Attending: Gastroenterology | Admitting: Gastroenterology

## 2019-05-17 ENCOUNTER — Other Ambulatory Visit
Admission: RE | Admit: 2019-05-17 | Discharge: 2019-05-17 | Disposition: A | Discharge status: Home / Self Care | Payer: Medicare Other | Source: Ambulatory Visit | Attending: Gastroenterology | Admitting: Gastroenterology

## 2019-05-17 DIAGNOSIS — R188 Other ascites: Secondary | ICD-10-CM | POA: Diagnosis present

## 2019-05-17 DIAGNOSIS — K7460 Unspecified cirrhosis of liver: Secondary | ICD-10-CM | POA: Insufficient documentation

## 2019-05-17 LAB — BODY FLUID CELL COUNT WITH DIFFERENTIAL
Eos, Fluid: 0 %
Lymphs, Fluid: 56 %
Monocyte-Macrophage-Serous Fluid: 40 %
Neutrophil Count, Fluid: 4 %
Total Nucleated Cell Count, Fluid: 1080 cu mm

## 2019-05-17 LAB — ALBUMIN, PLEURAL OR PERITONEAL FLUID: Albumin, Fluid: 2 g/dL

## 2019-05-17 LAB — PROTEIN, PLEURAL OR PERITONEAL FLUID: Total protein, fluid: 3.2 g/dL

## 2019-05-17 LAB — SODIUM: Sodium: 132 mmol/L — ABNORMAL LOW (ref 135–145)

## 2019-05-17 LAB — ALBUMIN: Albumin: 3.8 g/dL (ref 3.5–5.0)

## 2019-05-17 NOTE — Procedures (Signed)
PROCEDURE SUMMARY:  Successful US guided paracentesis from right lateral abdomen.  Yielded 3.8 liters of clear yellow fluid.  No immediate complications.  Patient tolerated well.  EBL = trace  Specimen was sent for labs.  Sherriann Szuch S Taniya Dasher PA-C 05/17/2019 2:11 PM

## 2019-05-19 ENCOUNTER — Other Ambulatory Visit: Payer: Self-pay | Admitting: Gastroenterology

## 2019-05-19 DIAGNOSIS — K7460 Unspecified cirrhosis of liver: Secondary | ICD-10-CM

## 2019-05-19 LAB — CYTOLOGY - NON PAP

## 2019-05-20 LAB — BODY FLUID CULTURE: Culture: NO GROWTH

## 2019-05-23 ENCOUNTER — Other Ambulatory Visit
Admission: RE | Admit: 2019-05-23 | Discharge: 2019-05-23 | Disposition: A | Discharge status: Home / Self Care | Payer: Medicare Other | Source: Ambulatory Visit | Attending: Internal Medicine | Admitting: Internal Medicine

## 2019-05-23 DIAGNOSIS — Z20822 Contact with and (suspected) exposure to covid-19: Secondary | ICD-10-CM | POA: Insufficient documentation

## 2019-05-23 DIAGNOSIS — Z01812 Encounter for preprocedural laboratory examination: Secondary | ICD-10-CM | POA: Diagnosis present

## 2019-05-23 LAB — SARS CORONAVIRUS 2 (TAT 6-24 HRS): SARS Coronavirus 2: NEGATIVE

## 2019-05-24 ENCOUNTER — Encounter: Payer: Self-pay | Admitting: Internal Medicine

## 2019-05-25 ENCOUNTER — Ambulatory Visit
Admission: RE | Admit: 2019-05-25 | Discharge: 2019-05-25 | Disposition: A | Discharge status: Home / Self Care | Payer: Medicare Other | Attending: Internal Medicine | Admitting: Internal Medicine

## 2019-05-25 ENCOUNTER — Encounter: Payer: Self-pay | Admitting: Internal Medicine

## 2019-05-25 ENCOUNTER — Other Ambulatory Visit: Payer: Self-pay

## 2019-05-25 ENCOUNTER — Encounter
Admission: RE | Disposition: A | Discharge status: Home / Self Care | Payer: Self-pay | Source: Home / Self Care | Attending: Internal Medicine

## 2019-05-25 ENCOUNTER — Ambulatory Visit: Payer: Medicare Other | Admitting: Anesthesiology

## 2019-05-25 DIAGNOSIS — K766 Portal hypertension: Secondary | ICD-10-CM | POA: Diagnosis not present

## 2019-05-25 DIAGNOSIS — K7469 Other cirrhosis of liver: Secondary | ICD-10-CM | POA: Diagnosis not present

## 2019-05-25 DIAGNOSIS — E785 Hyperlipidemia, unspecified: Secondary | ICD-10-CM | POA: Insufficient documentation

## 2019-05-25 DIAGNOSIS — K7581 Nonalcoholic steatohepatitis (NASH): Secondary | ICD-10-CM | POA: Diagnosis not present

## 2019-05-25 DIAGNOSIS — N400 Enlarged prostate without lower urinary tract symptoms: Secondary | ICD-10-CM | POA: Diagnosis not present

## 2019-05-25 DIAGNOSIS — K31819 Angiodysplasia of stomach and duodenum without bleeding: Secondary | ICD-10-CM | POA: Insufficient documentation

## 2019-05-25 DIAGNOSIS — G2581 Restless legs syndrome: Secondary | ICD-10-CM | POA: Diagnosis not present

## 2019-05-25 DIAGNOSIS — J45909 Unspecified asthma, uncomplicated: Secondary | ICD-10-CM | POA: Insufficient documentation

## 2019-05-25 DIAGNOSIS — Z13810 Encounter for screening for upper gastrointestinal disorder: Secondary | ICD-10-CM | POA: Diagnosis present

## 2019-05-25 DIAGNOSIS — G4733 Obstructive sleep apnea (adult) (pediatric): Secondary | ICD-10-CM | POA: Diagnosis not present

## 2019-05-25 DIAGNOSIS — Z79899 Other long term (current) drug therapy: Secondary | ICD-10-CM | POA: Diagnosis not present

## 2019-05-25 DIAGNOSIS — K2970 Gastritis, unspecified, without bleeding: Secondary | ICD-10-CM | POA: Diagnosis not present

## 2019-05-25 DIAGNOSIS — B9681 Helicobacter pylori [H. pylori] as the cause of diseases classified elsewhere: Secondary | ICD-10-CM | POA: Diagnosis not present

## 2019-05-25 DIAGNOSIS — I5032 Chronic diastolic (congestive) heart failure: Secondary | ICD-10-CM | POA: Diagnosis not present

## 2019-05-25 HISTORY — DX: Dyspnea, unspecified: R06.00

## 2019-05-25 HISTORY — DX: Pericardial effusion (noninflammatory): I31.3

## 2019-05-25 HISTORY — DX: Unspecified asthma, uncomplicated: J45.909

## 2019-05-25 HISTORY — PX: ESOPHAGOGASTRODUODENOSCOPY (EGD) WITH PROPOFOL: SHX5813

## 2019-05-25 HISTORY — DX: Abnormal weight loss: R63.4

## 2019-05-25 HISTORY — DX: Other pericardial effusion (noninflammatory): I31.39

## 2019-05-25 SURGERY — ESOPHAGOGASTRODUODENOSCOPY (EGD) WITH PROPOFOL
Anesthesia: General

## 2019-05-25 MED ORDER — GLYCOPYRROLATE 0.2 MG/ML IJ SOLN
INTRAMUSCULAR | Status: AC
  Filled 2019-05-25: qty 1

## 2019-05-25 MED ORDER — EPHEDRINE 5 MG/ML INJ
INTRAVENOUS | Status: AC
  Filled 2019-05-25: qty 10

## 2019-05-25 MED ORDER — LIDOCAINE HCL (CARDIAC) PF 100 MG/5ML IV SOSY
PREFILLED_SYRINGE | INTRAVENOUS | Status: DC | PRN
  Administered 2019-05-25: 25 mg via INTRAVENOUS

## 2019-05-25 MED ORDER — EPHEDRINE SULFATE 50 MG/ML IJ SOLN
INTRAMUSCULAR | Status: DC | PRN
  Administered 2019-05-25: 5 mg via INTRAVENOUS

## 2019-05-25 MED ORDER — PROPOFOL 10 MG/ML IV BOLUS
INTRAVENOUS | Status: DC | PRN
Start: 2019-05-25 — End: 2019-05-25
  Administered 2019-05-25: 20 mg via INTRAVENOUS

## 2019-05-25 MED ORDER — LIDOCAINE HCL (PF) 2 % IJ SOLN
INTRAMUSCULAR | Status: AC
  Filled 2019-05-25: qty 10

## 2019-05-25 MED ORDER — SODIUM CHLORIDE 0.9 % IV SOLN: INTRAVENOUS | Status: DC

## 2019-05-25 MED ORDER — PROPOFOL 500 MG/50ML IV EMUL
INTRAVENOUS | Status: DC | PRN
  Administered 2019-05-25: 75 ug/kg/min via INTRAVENOUS

## 2019-05-25 MED ORDER — PHENYLEPHRINE HCL (PRESSORS) 10 MG/ML IV SOLN
INTRAVENOUS | Status: DC | PRN
  Administered 2019-05-25: 50 ug via INTRAVENOUS
  Administered 2019-05-25: 100 ug via INTRAVENOUS
  Administered 2019-05-25: 50 ug via INTRAVENOUS

## 2019-05-25 MED ORDER — GLYCOPYRROLATE 0.2 MG/ML IJ SOLN
INTRAMUSCULAR | Status: DC | PRN
  Administered 2019-05-25: .1 mg via INTRAVENOUS

## 2019-05-25 MED ORDER — PROPOFOL 10 MG/ML IV BOLUS
INTRAVENOUS | Status: AC
  Filled 2019-05-25: qty 20

## 2019-05-25 MED ORDER — PROPOFOL 500 MG/50ML IV EMUL
INTRAVENOUS | Status: AC
  Filled 2019-05-25: qty 50

## 2019-05-25 NOTE — Op Note (Signed)
[facility] Gastroenterology Patient Name: Gordon Cameron Procedure Date: 05/25/2019 9:51 AM MRN: [id number] Account #: [id number] Date of Birth: [birth year] Admit Type: Outpatient Age: 85 Room: [facility] ENDO ROOM 3 Gender: Male Note Status: Finalized Procedure:             Upper GI endoscopy Indications:           Variceal screening (no known varices or prior                         bleeding), Suspected portal hypertensive gastropathy,                         Portal venous hypertension Providers:             Benay Pike. Emalyn Schou MD, MD Medicines:             Propofol per Anesthesia Complications:         No immediate complications. Procedure:             Pre-Anesthesia Assessment:                        - The risks and benefits of the procedure and the                         sedation options and risks were discussed with the                         patient. All questions were answered and informed                         consent was obtained.                        - Patient identification and proposed procedure were                         verified prior to the procedure by the nurse. The                         procedure was verified in the procedure room.                        - ASA Grade Assessment: III - A patient with severe                         systemic disease.                        - After reviewing the risks and benefits, the patient                         was deemed in satisfactory condition to undergo the                         procedure.                        After obtaining informed consent, the endoscope was  passed under direct vision. Throughout the procedure,                         the patient's blood pressure, pulse, and oxygen                         saturations were monitored continuously. The Endoscope                         was introduced through the mouth, and advanced to the                         third part of  duodenum. The upper GI endoscopy was                         accomplished without difficulty. The patient tolerated                         the procedure well. Findings:      The esophagus was normal.      Mild portal hypertensive gastropathy was found in the entire examined       stomach.      Mild gastric antral vascular ectasia without bleeding was present in the       gastric antrum. Biopsies were taken with a cold forceps for histology.      The examined duodenum was normal.      The exam was otherwise without abnormality. Impression:            - Normal esophagus.                        - Portal hypertensive gastropathy.                        - Gastric antral vascular ectasia without bleeding.                         Biopsied.                        - Normal examined duodenum.                        - The examination was otherwise normal. Recommendation:        - Patient has a contact number available for                         emergencies. The signs and symptoms of potential                         delayed complications were discussed with the patient.                         Return to normal activities tomorrow. Written                         discharge instructions were provided to the patient.                        - Continue present medications.                        -  Resume previous diet.                        - Await pathology results.                        - Return to physician assistant in 6 weeks.                        - Follow up with Octavia Bruckner, PA-C in [ ]  months. Procedure Code(s):     --- Professional ---                        [phone], Esophagogastroduodenoscopy, flexible,                         transoral; with biopsy, single or multiple Diagnosis Code(s):     --- Professional ---                        Z13.810, Encounter for screening for upper                         gastrointestinal disorder                        K31.819, Angiodysplasia of stomach  and duodenum                         without bleeding                        K31.89, Other diseases of stomach and duodenum                        K76.6, Portal hypertension CPT copyright 2019 American Medical Association. All rights reserved. The codes documented in this report are preliminary and upon coder review may  be revised to meet current compliance requirements. Efrain Sella MD, MD 05/25/2019 10:23:12 AM This report has been signed electronically. Number of Addenda: 0 Note Initiated On: 05/25/2019 9:51 AM Estimated Blood Loss:  Estimated blood loss: none.      [facility]

## 2019-05-25 NOTE — Anesthesia Postprocedure Evaluation (Signed)
Anesthesia Post Note  Patient: Gordon Cameron  Procedure(s) Performed: ESOPHAGOGASTRODUODENOSCOPY (EGD) WITH PROPOFOL (N/A )  Patient location during evaluation: PACU Anesthesia Type: General Level of consciousness: awake and alert Pain management: pain level controlled Vital Signs Assessment: post-procedure vital signs reviewed and stable Respiratory status: spontaneous breathing, nonlabored ventilation, respiratory function stable and patient connected to nasal cannula oxygen Cardiovascular status: blood pressure returned to baseline and stable Postop Assessment: no apparent nausea or vomiting Anesthetic complications: no     Last Vitals:  Vitals:   05/25/19 1026 05/25/19 1036  BP: 95/70 99/65  Pulse: 72 71  Resp: (!) 23 (!) 24  Temp: (!) 35.9 C   SpO2: 98% 96%    Last Pain:  Vitals:   05/25/19 1036  TempSrc:   PainSc: 0-No pain                 Molli Barrows

## 2019-05-25 NOTE — Anesthesia Preprocedure Evaluation (Signed)
Anesthesia Evaluation  Patient identified by MRN, date of birth, ID band Patient awake    Reviewed: Allergy & Precautions, H&P , NPO status , Patient's Chart, lab work & pertinent test results, reviewed documented beta blocker date and time   Airway Mallampati: II   Neck ROM: full    Dental  (+) Upper Dentures, Lower Dentures   Pulmonary shortness of breath, sleep apnea and Continuous Positive Airway Pressure Ventilation ,    Pulmonary exam normal        Cardiovascular Exercise Tolerance: Good negative cardio ROS Normal cardiovascular exam Rhythm:regular Rate:Normal     Neuro/Psych negative neurological ROS  negative psych ROS   GI/Hepatic negative GI ROS, (+) Hepatitis -  Endo/Other  negative endocrine ROS  Renal/GU negative Renal ROS  negative genitourinary   Musculoskeletal   Abdominal   Peds  Hematology negative hematology ROS (+)   Anesthesia Other Findings Past Medical History: No date: BPH (benign prostatic hyperplasia) No date: CHF (congestive heart failure) ([facility]) No date: Dyspnea No date: Hyperlipidemia No date: Liver cirrhosis secondary to NASH (nonalcoholic  steatohepatitis) ([facility]) No date: OSA (obstructive sleep apnea) No date: Pericardial effusion No date: Reactive airway disease No date: Restless leg No date: Weight loss Past Surgical History: No date: BACK SURGERY     Comment:  2001 01/28/2017: COLONOSCOPY WITH PROPOFOL; N/A     Comment:  Procedure: COLONOSCOPY WITH PROPOFOL;  Surgeon: Manya Silvas, MD;  Location: [facility] ENDOSCOPY;  Service:               Endoscopy;  Laterality: N/A; No date: ELBOW SURGERY; Right No date: EYE SURGERY No date: NECK SURGERY BMI    Body Mass Index: 24.33 kg/m     Reproductive/Obstetrics negative OB ROS                             Anesthesia Physical Anesthesia Plan  ASA: III  Anesthesia Plan: General    Post-op Pain Management:    Induction:   PONV Risk Score and Plan:   Airway Management Planned:   Additional Equipment:   Intra-op Plan:   Post-operative Plan:   Informed Consent: I have reviewed the patients History and Physical, chart, labs and discussed the procedure including the risks, benefits and alternatives for the proposed anesthesia with the patient or authorized representative who has indicated his/her understanding and acceptance.     Dental Advisory Given  Plan Discussed with: CRNA  Anesthesia Plan Comments:         Anesthesia Quick Evaluation

## 2019-05-25 NOTE — H&P (Signed)
  Outpatient short stay form Pre-procedure 05/25/2019 9:55 AM Shalese Strahan K. Alice Reichert, M.D.  Primary Physician: Frazier Richards, M.D.  Reason for visit:  Portal venous hypertension, Cirrhosis.   History of present illness:  Patient with a hx of thrombocytopenia, Diastolic CHF and cryptogenic cirrhosis presents for variceal screening. Patient denies intractable heartburn, dysphagia, hemetemesis, abdominal pain, nausea or vomiting.      Current Facility-Administered Medications:  .  0.9 %  sodium chloride infusion, , Intravenous, Continuous, [place], Benay Pike, MD, Last Rate: 20 mL/hr at 05/25/19 0941, New Bag at 05/25/19 0941  Medications Prior to Admission  Medication Sig Dispense Refill Last Dose  . albuterol (VENTOLIN HFA) 108 (90 Base) MCG/ACT inhaler Inhale 2 puffs into the lungs every 6 (six) hours as needed for wheezing or shortness of breath. 1 Inhaler 5 05/24/2019 at Unknown time  . finasteride (PROSCAR) 5 MG tablet Once a day   05/24/2019 at Unknown time  . fluticasone (FLONASE) 50 MCG/ACT nasal spray Place 2 sprays into both nostrils daily.   05/24/2019 at Unknown time  . furosemide (LASIX) 80 MG tablet Take 160 mg by mouth daily.   2 05/24/2019 at Unknown time  . spironolactone (ALDACTONE) 50 MG tablet Take 100 mg by mouth daily.         No Known Allergies   Past Medical History:  Diagnosis Date  . BPH (benign prostatic hyperplasia)   . CHF (congestive heart failure) ([place])   . Dyspnea   . Hyperlipidemia   . Liver cirrhosis secondary to NASH (nonalcoholic steatohepatitis) ([place])   . OSA (obstructive sleep apnea)   . Pericardial effusion   . Reactive airway disease   . Restless leg   . Weight loss     Review of systems:  Otherwise negative.    Physical Exam  Gen: Alert, oriented. Appears stated age.  HEENT: [place]/AT. PERRLA. Lungs: CTA, no wheezes. CV: RR nl S1, S2. Abd: soft, benign, no masses. BS+ Ext: No edema. Pulses 2+    Planned procedures: Proceed with EGD. The  patient understands the nature of the planned procedure, indications, risks, alternatives and potential complications including but not limited to bleeding, infection, perforation, damage to internal organs and possible oversedation/side effects from anesthesia. The patient agrees and gives consent to proceed.  Please refer to procedure notes for findings, recommendations and patient disposition/instructions.     Dennise Raabe K. Alice Reichert, M.D. Gastroenterology 05/25/2019  9:55 AM

## 2019-05-25 NOTE — Transfer of Care (Signed)
Immediate Anesthesia Transfer of Care Note  Patient: Gordon Cameron  Procedure(s) Performed: ESOPHAGOGASTRODUODENOSCOPY (EGD) WITH PROPOFOL (N/A )  Patient Location: PACU  Anesthesia Type:MAC  Level of Consciousness: awake  Airway & Oxygen Therapy: Patient Spontanous Breathing and Patient connected to nasal cannula oxygen  Post-op Assessment: Report given to RN  Post vital signs: stable  Last Vitals:  Vitals Value Taken Time  BP 95/70 05/25/19 1026  Temp 35.9 C 05/25/19 1026  Pulse 70 05/25/19 1029  Resp 17 05/25/19 1029  SpO2 100 % 05/25/19 1029  Vitals shown include unvalidated device data.  Last Pain:  Vitals:   05/25/19 0928  TempSrc: Temporal  PainSc: 0-No pain         Complications: No apparent anesthesia complications

## 2019-05-25 NOTE — Interval H&P Note (Signed)
History and Physical Interval Note:  05/25/2019 9:57 AM  Gordon Cameron  has presented today for surgery, with the diagnosis of CIRRHOSIS OF LIVER.  The various methods of treatment have been discussed with the patient and family. After consideration of risks, benefits and other options for treatment, the patient has consented to  Procedure(s): ESOPHAGOGASTRODUODENOSCOPY (EGD) WITH PROPOFOL (N/A) as a surgical intervention.  The patient's history has been reviewed, patient examined, no change in status, stable for surgery.  I have reviewed the patient's chart and labs.  Questions were answered to the patient's satisfaction.     [place], [place]

## 2019-05-26 ENCOUNTER — Encounter: Payer: Self-pay | Admitting: *Deleted

## 2019-05-26 LAB — SURGICAL PATHOLOGY

## 2019-05-31 ENCOUNTER — Ambulatory Visit: Payer: Medicare Other

## 2019-06-14 ENCOUNTER — Other Ambulatory Visit: Payer: Self-pay

## 2019-06-14 ENCOUNTER — Other Ambulatory Visit
Admission: RE | Admit: 2019-06-14 | Discharge: 2019-06-14 | Disposition: A | Discharge status: Home / Self Care | Payer: Medicare Other | Source: Home / Self Care | Attending: Gastroenterology | Admitting: Gastroenterology

## 2019-06-14 ENCOUNTER — Ambulatory Visit
Admission: RE | Admit: 2019-06-14 | Discharge: 2019-06-14 | Disposition: A | Discharge status: Home / Self Care | Payer: Medicare Other | Source: Ambulatory Visit | Attending: Gastroenterology | Admitting: Gastroenterology

## 2019-06-14 ENCOUNTER — Other Ambulatory Visit: Payer: Self-pay | Admitting: Gastroenterology

## 2019-06-14 DIAGNOSIS — K7460 Unspecified cirrhosis of liver: Secondary | ICD-10-CM | POA: Insufficient documentation

## 2019-06-14 DIAGNOSIS — R188 Other ascites: Secondary | ICD-10-CM | POA: Insufficient documentation

## 2019-06-14 LAB — PROTEIN, PLEURAL OR PERITONEAL FLUID: Total protein, fluid: 3.6 g/dL

## 2019-06-14 LAB — ALBUMIN, PLEURAL OR PERITONEAL FLUID: Albumin, Fluid: 2.3 g/dL

## 2019-06-14 LAB — BODY FLUID CELL COUNT WITH DIFFERENTIAL
Eos, Fluid: 0 %
Lymphs, Fluid: 57 %
Monocyte-Macrophage-Serous Fluid: 41 %
Neutrophil Count, Fluid: 2 %
Other Cells, Fluid: 0 %
Total Nucleated Cell Count, Fluid: 487 cu mm

## 2019-06-14 LAB — ALBUMIN: Albumin: 3.9 g/dL (ref 3.5–5.0)

## 2019-06-14 LAB — SODIUM: Sodium: 131 mmol/L — ABNORMAL LOW (ref 135–145)

## 2019-06-14 NOTE — Procedures (Signed)
PROCEDURE SUMMARY:  Successful image-guided paracentesis from the right lower abdomen.  Yielded 3967 milliliters of amber fluid.  No immediate complications.  EBL: zero Patient tolerated well.   Specimen was sent for labs.  Please see imaging section of Epic for full dictation.  Joaquim Nam PA-C 06/14/2019 1:29 PM

## 2019-06-16 LAB — CYTOLOGY - NON PAP

## 2019-06-18 LAB — BODY FLUID CULTURE: Culture: NO GROWTH

## 2019-06-28 ENCOUNTER — Ambulatory Visit: Payer: Medicare Other

## 2019-07-12 ENCOUNTER — Ambulatory Visit: Payer: Medicare Other

## 2019-07-18 ENCOUNTER — Other Ambulatory Visit: Payer: Self-pay | Admitting: Gastroenterology

## 2019-07-18 DIAGNOSIS — R188 Other ascites: Secondary | ICD-10-CM

## 2019-07-18 DIAGNOSIS — K7460 Unspecified cirrhosis of liver: Secondary | ICD-10-CM

## 2019-07-21 ENCOUNTER — Other Ambulatory Visit
Admission: RE | Admit: 2019-07-21 | Discharge: 2019-07-21 | Disposition: A | Discharge status: Home / Self Care | Payer: Medicare Other | Source: Home / Self Care | Attending: Gastroenterology | Admitting: Gastroenterology

## 2019-07-21 ENCOUNTER — Other Ambulatory Visit: Payer: Self-pay

## 2019-07-21 ENCOUNTER — Ambulatory Visit
Admission: RE | Admit: 2019-07-21 | Discharge: 2019-07-21 | Disposition: A | Discharge status: Home / Self Care | Payer: Medicare Other | Source: Ambulatory Visit | Attending: Gastroenterology | Admitting: Gastroenterology

## 2019-07-21 DIAGNOSIS — K7460 Unspecified cirrhosis of liver: Secondary | ICD-10-CM

## 2019-07-21 DIAGNOSIS — R188 Other ascites: Secondary | ICD-10-CM | POA: Insufficient documentation

## 2019-07-21 LAB — BODY FLUID CELL COUNT WITH DIFFERENTIAL
Eos, Fluid: 0 %
Lymphs, Fluid: 71 %
Monocyte-Macrophage-Serous Fluid: 23 %
Neutrophil Count, Fluid: 6 %
Total Nucleated Cell Count, Fluid: 365 cu mm

## 2019-07-21 LAB — ALBUMIN: Albumin: 3.8 g/dL (ref 3.5–5.0)

## 2019-07-21 LAB — SODIUM: Sodium: 134 mmol/L — ABNORMAL LOW (ref 135–145)

## 2019-07-21 LAB — PROTEIN, PLEURAL OR PERITONEAL FLUID: Total protein, fluid: 3.4 g/dL

## 2019-07-21 LAB — ALBUMIN, PLEURAL OR PERITONEAL FLUID: Albumin, Fluid: 2.2 g/dL

## 2019-07-21 NOTE — Procedures (Signed)
PROCEDURE SUMMARY:  Successful image-guided paracentesis from the left lower abdomen.  Yielded 4460 milliliters of amber fluid.  No immediate complications.  EBL: zero Patient tolerated well.   Specimen was sent for labs.  Please see imaging section of Epic for full dictation.  Joaquim Nam PA-C 07/21/2019 9:26 AM

## 2019-07-22 LAB — CYTOLOGY - NON PAP

## 2019-07-24 LAB — BODY FLUID CULTURE: Culture: NO GROWTH

## 2019-08-02 ENCOUNTER — Other Ambulatory Visit: Payer: Self-pay | Admitting: Internal Medicine

## 2019-08-02 DIAGNOSIS — S32020A Wedge compression fracture of second lumbar vertebra, initial encounter for closed fracture: Secondary | ICD-10-CM

## 2019-08-02 DIAGNOSIS — S32010B Wedge compression fracture of first lumbar vertebra, initial encounter for open fracture: Secondary | ICD-10-CM

## 2019-08-04 ENCOUNTER — Other Ambulatory Visit: Payer: Self-pay

## 2019-08-04 ENCOUNTER — Ambulatory Visit
Admission: RE | Admit: 2019-08-04 | Discharge: 2019-08-04 | Disposition: A | Discharge status: Home / Self Care | Payer: Medicare Other | Source: Ambulatory Visit | Attending: Internal Medicine | Admitting: Internal Medicine

## 2019-08-04 DIAGNOSIS — S32010B Wedge compression fracture of first lumbar vertebra, initial encounter for open fracture: Secondary | ICD-10-CM

## 2019-08-04 DIAGNOSIS — S32020A Wedge compression fracture of second lumbar vertebra, initial encounter for closed fracture: Secondary | ICD-10-CM | POA: Diagnosis not present

## 2019-08-04 MED ORDER — GADOBUTROL 1 MMOL/ML IV SOLN
7.0000 mL | Freq: Once | INTRAVENOUS | Status: AC | PRN
  Administered 2019-08-04: 7 mL via INTRAVENOUS

## 2019-08-08 ENCOUNTER — Other Ambulatory Visit: Payer: Self-pay | Admitting: Orthopedic Surgery

## 2019-08-09 ENCOUNTER — Other Ambulatory Visit: Payer: Self-pay

## 2019-08-09 ENCOUNTER — Encounter
Admission: RE | Admit: 2019-08-09 | Discharge: 2019-08-09 | Disposition: A | Discharge status: Home / Self Care | Payer: Medicare Other | Source: Ambulatory Visit | Attending: Orthopedic Surgery | Admitting: Orthopedic Surgery

## 2019-08-09 HISTORY — DX: Other ascites: R18.8

## 2019-08-09 HISTORY — DX: Chronic diastolic (congestive) heart failure: I50.32

## 2019-08-09 NOTE — Patient Instructions (Addendum)
Your procedure is scheduled on: Thursday, July 22 Report to Day Surgery on the 2nd floor of the [facility]. To find out your arrival time, please call [phone] between 1PM - 3PM on: Wednesday, July 21  REMEMBER: Instructions that are not followed completely may result in serious medical risk, up to and including death; or upon the discretion of your surgeon and anesthesiologist your surgery may need to be rescheduled.  Do not eat food after midnight the night before surgery.  No gum chewing, lozengers or hard candies.  You may however, drink CLEAR liquids up to 2 hours before you are scheduled to arrive for your surgery. Do not drink anything within 2 hours of your scheduled arrival time.  Clear liquids include: - water  - apple juice without pulp - gatorade (not RED) - black coffee or tea (Do NOT add milk or creamers to the coffee or tea) Do NOT drink anything that is not on this list.  ENSURE PRE-SURGERY CARBOHYDRATE DRINK:  Complete drinking 2 hours prior to scheduled arrival time.  TAKE THESE MEDICATIONS THE MORNING OF SURGERY WITH A SIP OF WATER:  1.  Albuterol inhaler 2.  Tramadol if needed for pain  Use inhalers on the day of surgery and bring to the hospital.  Stop Anti-inflammatories (NSAIDS) such as Advil, Aleve, Ibuprofen, Motrin, Naproxen, Naprosyn and Aspirin based products such as Excedrin, Goodys Powder, BC Powder. (May take Tylenol or Acetaminophen if needed.)  Stop ANY OVER THE COUNTER supplements until after surgery.  On the morning of surgery brush your teeth with toothpaste and water, you may rinse your mouth with mouthwash if you wish. Do not swallow any toothpaste or mouthwash.  Do not wear jewelry.  Do not wear lotions, powders, or perfumes.   Hearing aids and dentures may not be worn into surgery.  Do not bring valuables to the hospital. [facility] is not responsible for any missing/lost belongings or valuables.   Use CHG Soap as directed  on instruction sheet.  Bring your C-PAP to the hospital with you in case you may have to spend the night.   Notify your doctor if there is any change in your medical condition (cold, fever, infection).  Wear comfortable clothing (specific to your surgery type) to the hospital.  Plan for stool softeners for home use; pain medications have a tendency to cause constipation. You can also help prevent constipation by eating foods high in fiber such as fruits and vegetables and drinking plenty of fluids as your diet allows.  After surgery, you can help prevent lung complications by doing breathing exercises.  Take deep breaths and cough every 1-2 hours. Your doctor may order a device called an Incentive Spirometer to help you take deep breaths.  If you are being discharged the day of surgery, you will not be allowed to drive home. You will need a responsible adult (18 years or older) to drive you home and stay with you that night.   If you are taking public transportation, you will need to have a responsible adult (18 years or older) with you. Please confirm with your physician that it is acceptable to use public transportation.   Please call the [place] Dept. at [phone] if you have any questions about these instructions.  Visitation Policy:  Patients undergoing a surgery or procedure may have one family member or support person with them as long as that person is not COVID-19 positive or experiencing its symptoms.  That person may  remain in the waiting area during the procedure.  As a reminder, masks are still required for all [place] team members, patients and visitors in all [facility] facilities.   Systemwide, no visitors 17 or younger.

## 2019-08-10 ENCOUNTER — Encounter
Admission: RE | Admit: 2019-08-10 | Discharge: 2019-08-10 | Disposition: A | Discharge status: Home / Self Care | Payer: Medicare Other | Source: Ambulatory Visit | Attending: Orthopedic Surgery | Admitting: Orthopedic Surgery

## 2019-08-10 ENCOUNTER — Encounter: Payer: Self-pay | Admitting: Urgent Care

## 2019-08-10 ENCOUNTER — Other Ambulatory Visit: Admission: RE | Admit: 2019-08-10 | Payer: Medicare Other | Source: Ambulatory Visit

## 2019-08-10 DIAGNOSIS — M4856XA Collapsed vertebra, not elsewhere classified, lumbar region, initial encounter for fracture: Secondary | ICD-10-CM | POA: Insufficient documentation

## 2019-08-10 DIAGNOSIS — I491 Atrial premature depolarization: Secondary | ICD-10-CM | POA: Diagnosis not present

## 2019-08-10 DIAGNOSIS — Z20822 Contact with and (suspected) exposure to covid-19: Secondary | ICD-10-CM | POA: Insufficient documentation

## 2019-08-10 DIAGNOSIS — Z01812 Encounter for preprocedural laboratory examination: Secondary | ICD-10-CM | POA: Insufficient documentation

## 2019-08-10 NOTE — H&P (Signed)
Chief Complaint  Patient presents with  . Establish Care  Compression fracture   History of the Present Illness: Gordon Cameron is a 85 y.o. male here for evaluation of an L2 compression fracture viewed on a prior MRI at [facility].  He reports that he was trying to pull a wheelbarrow backwards, and the wheelbarrow got stuck and he had to pull harder than he should and his feet slid up from him falling to the ground. He denies having chronic back pain.  He reports that Monday on 08/01/2019 when it happened, his pain was a 10 out of 10, but after he received an injection by internal medicine and he took metaxalone and the following day his pain decreased to a 5 out of 10. He localizes the pain to the low back. The patient denies lower extremity radiation.   The patient has congestive heart disease and liver disease. The patient states he can not tell if his liver is doing well due to his back pain. Since the fall, he reports that he has not had a bowel movement, but has had the sensation. He is taking Miralax and Milk of Magnesia.   I have reviewed past medical, surgical, social and family history, and allergies as documented in the EMR.  Past Medical History: Past Medical History:  Diagnosis Date  . Allergic rhinitis  . Allergy  . Asthma  . BPH (benign prostatic hypertrophy)  . Chronic diastolic congestive heart failure (CMS-HCC) 04/19/2018  . Colon polyp  . Hyperglycemia  . Hyperlipidemia  . RAD (reactive airway disease)  . Restless legs  . Sleep apnea   Past Surgical History: Past Surgical History:  Procedure Laterality Date  . Back surgery 2001  [facility]  . CATARACT EXTRACTION  . COLONOSCOPY 12/30/2006  [facility]  . COLONOSCOPY 01/09/2011  [place] (2 Brothers): CBF 12/2015; Recall Ltr mailed 11/02/2015 (dw)  . COLONOSCOPY 01/28/2017  Adenomatous Polyp, [facility] (2 Brothers): No repeat recommended d/t age per RTE (dh)  . COLONOSCOPY  . EGD 05/25/2019   Gastritis/+ H. Pylori/No Repeat/[facility]  . Right elbow surgery   Past Family History: Family History  Problem Relation Age of Onset  . Colon cancer Brother  . Coronary Artery Disease (Blocked arteries around heart) Brother  . Heart disease Brother  . Colon cancer Brother  . Coronary Artery Disease (Blocked arteries around heart) Brother  . Heart disease Brother  . Colon cancer Maternal Grandmother   Medications: Current Outpatient Medications Ordered in Epic  Medication Sig Dispense Refill  . fluticasone propionate (FLONASE) 50 mcg/actuation nasal spray Place 2 sprays into both nostrils once daily 48 g 3  . FUROsemide (LASIX) 80 MG tablet Take 2 tablets (160 mg total) by mouth once daily 180 tablet 1  . magnesium oxide (MAGOX ORAL) Take by mouth 2 (two) times daily  . metaxalone (SKELAXIN) 800 mg tablet Take 1 tablet (800 mg total) by mouth 3 (three) times daily for 10 days 30 tablet 0  . predniSONE (DELTASONE) 20 MG tablet Take 2 tablet for 4 days, then decrease to 1 tablet for 4 days 12 tablet 0  . spironolactone (ALDACTONE) 100 MG tablet Take 1 tablet (100 mg total) by mouth once daily 30 tablet 11  . VENTOLIN HFA 90 mcg/actuation inhaler Inhale 2 inhalations into the lungs every 6 (six) hours as needed  . ZINC ORAL Take by mouth once daily  . finasteride (PROSCAR) 5 mg tablet TAKE 1 TABLET BY MOUTH EVERY DAY 90 tablet  1  . sodium chloride 0.9% infusion Apply twice daily to affected area by gently rinsing affected areas. Retract foreskin and apply to area as well. 500 mL 0  . traMADoL (ULTRAM) 50 mg tablet Take 1 tablet (50 mg total) by mouth every 6 (six) hours as needed for Pain for up to 5 days 40 tablet 0   No current Epic-ordered facility-administered medications on file.   Allergies: No Known Allergies   Body mass index is 24.75 kg/m.  Review of Systems: A comprehensive 14 point ROS was performed, reviewed, and the pertinent orthopaedic findings are documented in the  HPI.  Vitals:  08/05/19 1102  BP: 114/66   General Physical Examination:  General/Constitutional: No apparent distress: well-nourished and well developed. Eyes: Pupils equal, round with synchronous movement. Lungs: Clear to auscultation HEENT: Normal Vascular: No edema, swelling or tenderness, except as noted in detailed exam. Cardiac: Heart rate and rhythm is regular. Integumentary: No impressive skin lesions present, except as noted in detailed exam. Neuro/Psych: Normal mood and affect, oriented to person, place and time.  Musculoskeletal Examination: On examination, he has normal bilateral leg strength. Tenderness to L2.  Radiographs: L2 compression fracture viewed on a prior MRI at [facility].  Assessment: L 2 painful compression fracture  Plan: The patient has clinical findings of L2 compression fracture.  We discussed the patient 's prior MRI findings. In regard to his pain, we sent in a pain prescription to his local [facility] on [address] electronically to his pharmacy. We discussed use of a suppository with his constipation and discussed risks of him not having a bowel movement. I explained the L2 kyphoplasty procedure in detail to correct the deformity. The postoperative course was also discussed with the patient, given his current medical condition.   The nature of the condition and the L2 kyphoplasty procedure has been reviewed in detail with the patient. Surgical versus non-surgical options and prognosis for recovery have been reviewed and the inherent risks and benefits of each have been discussed including the risks of infection, bleeding, injury to nerves/blood vessels/tendons, incomplete relief of symptoms, persisting pain and/or stiffness, loss of function, complex regional pain syndrome, failure of the procedure, as appropriate.  Teeth: Normal  He will consider surgery and call back next-week, if they decide to proceed with a L2  kyphoplasty.  Scribe Attestation: Standley Brooking, am acting as scribe for Lauris Poag, MD    Electronically signed by Lauris Poag, MD at 08/05/2019 8:44 PM EDT  Back Reviewed paper H+P, will be scanned into chart. No changes noted.

## 2019-08-11 ENCOUNTER — Ambulatory Visit: Payer: Medicare Other

## 2019-08-11 ENCOUNTER — Ambulatory Visit: Payer: Medicare Other | Admitting: Anesthesiology

## 2019-08-11 ENCOUNTER — Encounter
Admission: RE | Disposition: A | Discharge status: Home / Self Care | Payer: Self-pay | Source: Home / Self Care | Attending: Orthopedic Surgery

## 2019-08-11 ENCOUNTER — Encounter: Payer: Self-pay | Admitting: Orthopedic Surgery

## 2019-08-11 ENCOUNTER — Ambulatory Visit
Admission: RE | Admit: 2019-08-11 | Discharge: 2019-08-11 | Disposition: A | Discharge status: Home / Self Care | Payer: Medicare Other | Attending: Orthopedic Surgery | Admitting: Orthopedic Surgery

## 2019-08-11 DIAGNOSIS — Z79899 Other long term (current) drug therapy: Secondary | ICD-10-CM | POA: Diagnosis not present

## 2019-08-11 DIAGNOSIS — I110 Hypertensive heart disease with heart failure: Secondary | ICD-10-CM | POA: Diagnosis not present

## 2019-08-11 DIAGNOSIS — W19XXXA Unspecified fall, initial encounter: Secondary | ICD-10-CM | POA: Insufficient documentation

## 2019-08-11 DIAGNOSIS — N400 Enlarged prostate without lower urinary tract symptoms: Secondary | ICD-10-CM | POA: Diagnosis not present

## 2019-08-11 DIAGNOSIS — S32020A Wedge compression fracture of second lumbar vertebra, initial encounter for closed fracture: Secondary | ICD-10-CM | POA: Insufficient documentation

## 2019-08-11 DIAGNOSIS — J45909 Unspecified asthma, uncomplicated: Secondary | ICD-10-CM | POA: Insufficient documentation

## 2019-08-11 DIAGNOSIS — I5032 Chronic diastolic (congestive) heart failure: Secondary | ICD-10-CM | POA: Diagnosis not present

## 2019-08-11 DIAGNOSIS — K7460 Unspecified cirrhosis of liver: Secondary | ICD-10-CM | POA: Insufficient documentation

## 2019-08-11 DIAGNOSIS — G4733 Obstructive sleep apnea (adult) (pediatric): Secondary | ICD-10-CM | POA: Diagnosis not present

## 2019-08-11 DIAGNOSIS — Z419 Encounter for procedure for purposes other than remedying health state, unspecified: Secondary | ICD-10-CM

## 2019-08-11 DIAGNOSIS — Z8249 Family history of ischemic heart disease and other diseases of the circulatory system: Secondary | ICD-10-CM | POA: Diagnosis not present

## 2019-08-11 DIAGNOSIS — K7581 Nonalcoholic steatohepatitis (NASH): Secondary | ICD-10-CM | POA: Insufficient documentation

## 2019-08-11 HISTORY — PX: KYPHOPLASTY: SHX5884

## 2019-08-11 LAB — SARS CORONAVIRUS 2 (TAT 6-24 HRS): SARS Coronavirus 2: NEGATIVE

## 2019-08-11 SURGERY — KYPHOPLASTY
Anesthesia: Monitor Anesthesia Care | Site: Spine Lumbar

## 2019-08-11 MED ORDER — LIDOCAINE HCL (PF) 1 % IJ SOLN
INTRAMUSCULAR | Status: AC
  Filled 2019-08-11: qty 30

## 2019-08-11 MED ORDER — PROPOFOL 500 MG/50ML IV EMUL
INTRAVENOUS | Status: AC
  Filled 2019-08-11: qty 50

## 2019-08-11 MED ORDER — LIDOCAINE HCL 1 % IJ SOLN
INTRAMUSCULAR | Status: DC | PRN
  Administered 2019-08-11: 10 mL

## 2019-08-11 MED ORDER — LIDOCAINE HCL (PF) 2 % IJ SOLN
INTRAMUSCULAR | Status: AC
  Filled 2019-08-11: qty 5

## 2019-08-11 MED ORDER — FAMOTIDINE 20 MG PO TABS
ORAL_TABLET | ORAL | Status: AC
  Administered 2019-08-11: 20 mg via ORAL
  Filled 2019-08-11: qty 1

## 2019-08-11 MED ORDER — BUPIVACAINE HCL (PF) 0.5 % IJ SOLN
INTRAMUSCULAR | Status: AC
  Filled 2019-08-11: qty 30

## 2019-08-11 MED ORDER — FENTANYL CITRATE (PF) 100 MCG/2ML IJ SOLN
INTRAMUSCULAR | Status: DC | PRN
  Administered 2019-08-11: 50 ug via INTRAVENOUS

## 2019-08-11 MED ORDER — CHLORHEXIDINE GLUCONATE 0.12 % MT SOLN
OROMUCOSAL | Status: AC
  Administered 2019-08-11: 15 mL via OROMUCOSAL
  Filled 2019-08-11: qty 15

## 2019-08-11 MED ORDER — SODIUM CHLORIDE (PF) 0.9 % IJ SOLN
INTRAMUSCULAR | Status: AC
  Filled 2019-08-11: qty 10

## 2019-08-11 MED ORDER — MIDAZOLAM HCL 2 MG/2ML IJ SOLN
INTRAMUSCULAR | Status: AC
  Filled 2019-08-11: qty 2

## 2019-08-11 MED ORDER — CEFAZOLIN SODIUM-DEXTROSE 2-4 GM/100ML-% IV SOLN
INTRAVENOUS | Status: AC
  Filled 2019-08-11: qty 100

## 2019-08-11 MED ORDER — MIDAZOLAM HCL 2 MG/2ML IJ SOLN
INTRAMUSCULAR | Status: DC | PRN
  Administered 2019-08-11: 1 mg via INTRAVENOUS

## 2019-08-11 MED ORDER — FAMOTIDINE 20 MG PO TABS: 20.0000 mg | ORAL_TABLET | Freq: Once | ORAL | Status: AC

## 2019-08-11 MED ORDER — CHLORHEXIDINE GLUCONATE 0.12 % MT SOLN: 15.0000 mL | Freq: Once | OROMUCOSAL | Status: AC

## 2019-08-11 MED ORDER — FENTANYL CITRATE (PF) 100 MCG/2ML IJ SOLN
INTRAMUSCULAR | Status: AC
  Filled 2019-08-11: qty 2

## 2019-08-11 MED ORDER — BUPIVACAINE-EPINEPHRINE (PF) 0.5% -1:200000 IJ SOLN
INTRAMUSCULAR | Status: DC | PRN
  Administered 2019-08-11: 30 mL

## 2019-08-11 MED ORDER — CEFAZOLIN SODIUM-DEXTROSE 2-4 GM/100ML-% IV SOLN
2.0000 g | INTRAVENOUS | Status: AC
  Administered 2019-08-11: 2 g via INTRAVENOUS

## 2019-08-11 MED ORDER — ORAL CARE MOUTH RINSE: 15.0000 mL | Freq: Once | OROMUCOSAL | Status: AC

## 2019-08-11 MED ORDER — ONDANSETRON HCL 4 MG/2ML IJ SOLN
INTRAMUSCULAR | Status: AC
  Filled 2019-08-11: qty 2

## 2019-08-11 MED ORDER — LACTATED RINGERS IV SOLN: INTRAVENOUS | Status: DC

## 2019-08-11 MED ORDER — PROPOFOL 500 MG/50ML IV EMUL
INTRAVENOUS | Status: DC | PRN
  Administered 2019-08-11: 75 ug/kg/min via INTRAVENOUS

## 2019-08-11 MED ORDER — BUPIVACAINE-EPINEPHRINE (PF) 0.5% -1:200000 IJ SOLN
INTRAMUSCULAR | Status: AC
  Filled 2019-08-11: qty 30

## 2019-08-11 MED ORDER — IOHEXOL 180 MG/ML  SOLN
INTRAMUSCULAR | Status: DC | PRN
  Administered 2019-08-11: 360 mL

## 2019-08-11 MED ORDER — DEXAMETHASONE SODIUM PHOSPHATE 10 MG/ML IJ SOLN
INTRAMUSCULAR | Status: AC
  Filled 2019-08-11: qty 1

## 2019-08-11 SURGICAL SUPPLY — 19 items
CEMENT KYPHON CX01A KIT/MIXER (Cement) ×2 IMPLANT
COVER WAND RF STERILE (DRAPES) ×2 IMPLANT
DERMABOND ADVANCED (GAUZE/BANDAGES/DRESSINGS) ×1
DERMABOND ADVANCED .7 DNX12 (GAUZE/BANDAGES/DRESSINGS) ×1 IMPLANT
DEVICE BIOPSY BONE KYPHX (INSTRUMENTS) ×2 IMPLANT
DRAPE C-ARM XRAY 36X54 (DRAPES) ×2 IMPLANT
DURAPREP 26ML APPLICATOR (WOUND CARE) ×2 IMPLANT
GLOVE SURG SYN 9.0  PF PI (GLOVE) ×1
GLOVE SURG SYN 9.0 PF PI (GLOVE) ×1 IMPLANT
GOWN SRG 2XL LVL 4 RGLN SLV (GOWNS) ×1 IMPLANT
GOWN STRL NON-REIN 2XL LVL4 (GOWNS) ×1
GOWN STRL REUS W/ TWL LRG LVL3 (GOWN DISPOSABLE) ×1 IMPLANT
GOWN STRL REUS W/TWL LRG LVL3 (GOWN DISPOSABLE) ×1
PACK KYPHOPLASTY (MISCELLANEOUS) ×2 IMPLANT
RENTAL RFA GENERATOR (MISCELLANEOUS) IMPLANT
STRAP SAFETY 5IN WIDE (MISCELLANEOUS) ×2 IMPLANT
SWABSTK COMLB BENZOIN TINCTURE (MISCELLANEOUS) ×2 IMPLANT
TRAY KYPHOPAK 15/3 EXPRESS 1ST (MISCELLANEOUS) ×2 IMPLANT
TRAY KYPHOPAK 20/3 EXPRESS 1ST (MISCELLANEOUS) IMPLANT

## 2019-08-11 NOTE — Op Note (Signed)
Date August 11, 2019  time 1:14 PM   PATIENT:  Gordon Cameron   PRE-OPERATIVE DIAGNOSIS:  closed wedge compression fracture of L2   POST-OPERATIVE DIAGNOSIS:  closed wedge compression fracture of L2   PROCEDURE:  Procedure(s): KYPHOPLASTY L2  SURGEON: Laurene Footman, MD   ASSISTANTS: None   ANESTHESIA:   local and MAC   EBL:  No intake/output data recorded.   BLOOD ADMINISTERED:none   DRAINS: none    LOCAL MEDICATIONS USED:  MARCAINE    and XYLOCAINE    SPECIMEN:   L2 vertebral body biopsy   DISPOSITION OF SPECIMEN:  Pathology   COUNTS:  YES   TOURNIQUET:  * No tourniquets in log *   IMPLANTS: Bone cement   DICTATION: .Dragon Dictation  patient was brought to the operating room and after adequate anesthesia was obtained the patient was placed prone.  C arm was brought in in good visualization of the affected level obtained on both AP and lateral projections.  After patient identification and timeout procedures were completed, local anesthetic was infiltrated with 10 cc 1% Xylocaine infiltrated subcutaneously.  This is done the area on the each side of the planned approach.  The back was then prepped and draped in the usual sterile manner and repeat timeout procedure carried out.  A spinal needle was brought down to the pedicle on the each side of  L2 and a 50-50 mix of 1% Xylocaine half percent Sensorcaine with epinephrine total of 20 cc injected on each side.  After allowing this to set a small incision was made and the trocar was advanced into the vertebral body in an extrapedicular fashion.  Biopsy was obtained Drilling was carried out balloon inserted with inflation to  3 cc on the right and 4 cc on the left.  When the cement was appropriate consistency for cc were injected on the right and 4-1/2 cc on the left into the vertebral body without extravasation, good fill superior to inferior endplates and from right to left sides along the inferior endplate.  After the cement had  set the trochar was removed and permanent C-arm views obtained.  The wound was closed with Dermabond followed by [place]:  Discharged home after recovery room   PATIENT DISPOSITION:  PACU - hemodynamically stable.

## 2019-08-11 NOTE — Anesthesia Procedure Notes (Signed)
Date/Time: 08/11/2019 12:20 PM Performed by: Johnna Acosta, CRNA Pre-anesthesia Checklist: Patient identified, Emergency Drugs available, Suction available, Patient being monitored and Timeout performed Patient Re-evaluated:Patient Re-evaluated prior to induction Oxygen Delivery Method: Nasal cannula Preoxygenation: Pre-oxygenation with 100% oxygen Induction Type: IV induction

## 2019-08-11 NOTE — Anesthesia Postprocedure Evaluation (Signed)
Anesthesia Post Note  Patient: Gordon Cameron  Procedure(s) Performed: L2 KYPHOPLASTY (N/A Spine Lumbar)  Patient location during evaluation: PACU Anesthesia Type: General Level of consciousness: awake and alert Pain management: pain level controlled Vital Signs Assessment: post-procedure vital signs reviewed and stable Respiratory status: spontaneous breathing, nonlabored ventilation, respiratory function stable and patient connected to nasal cannula oxygen Cardiovascular status: blood pressure returned to baseline and stable Postop Assessment: no apparent nausea or vomiting Anesthetic complications: no   No complications documented.   Last Vitals:  Vitals:   08/11/19 1318 08/11/19 1332  BP: (!) 121/86 (!) 120/86  Pulse:    Resp: 12 23  Temp:    SpO2:      Last Pain:  Vitals:   08/11/19 1317  TempSrc:   PainSc: [place] Aaylah Pokorny

## 2019-08-11 NOTE — Discharge Instructions (Addendum)
Take it easy today and tomorrow then try to resume more normal activities especially walking. Try to avoid bending or lifting for the next week.  Band-Aids to be removed on Saturday then okay to shower. Resume Eliquis tonight. Call office if you are having any problems.  AMBULATORY SURGERY  DISCHARGE INSTRUCTIONS   1) The drugs that you were given will stay in your system until tomorrow so for the next 24 hours you should not:  A) Drive an automobile B) Make any legal decisions C) Drink any alcoholic beverage   2) You may resume regular meals tomorrow.  Today it is better to start with liquids and gradually work up to solid foods.  You may eat anything you prefer, but it is better to start with liquids, then soup and crackers, and gradually work up to solid foods.   3) Please notify your doctor immediately if you have any unusual bleeding, trouble breathing, redness and pain at the surgery site, drainage, fever, or pain not relieved by medication.    4) Additional Instructions:    Please contact your physician with any problems or Same Day Surgery at [phone], Monday through Friday 6 am to 4 pm, or [place] at [facility] number at [phone].

## 2019-08-11 NOTE — Transfer of Care (Signed)
Immediate Anesthesia Transfer of Care Note  Patient: Gordon Cameron  Procedure(s) Performed: L2 KYPHOPLASTY (N/A Spine Lumbar)  Patient Location: PACU  Anesthesia Type:General  Level of Consciousness: sedated  Airway & Oxygen Therapy: Patient Spontanous Breathing and Patient connected to nasal cannula oxygen  Post-op Assessment: Report given to RN and Post -op Vital signs reviewed and stable  Post vital signs: Reviewed and stable  Last Vitals:  Vitals Value Taken Time  BP 121/86 08/11/19 1318  Temp    Pulse    Resp 12 08/11/19 1318  SpO2      Last Pain:  Vitals:   08/11/19 1130  TempSrc: Tympanic  PainSc: 5          Complications: No complications documented.

## 2019-08-11 NOTE — OR Nursing (Signed)
Called Dr. Rudene Christians and advised him of edema noted to surgery site. He advised " nothing to worry about" and pt. Clear for discharge.

## 2019-08-11 NOTE — Anesthesia Preprocedure Evaluation (Addendum)
Anesthesia Evaluation  Patient identified by MRN, date of birth, ID band Patient awake    Reviewed: Allergy & Precautions, H&P , NPO status , Patient's Chart, lab work & pertinent test results, reviewed documented beta blocker date and time   Airway Mallampati: II  TM Distance: >3 FB Neck ROM: full    Dental no notable dental hx. (+) Teeth Intact   Pulmonary neg pulmonary ROS, shortness of breath, asthma , sleep apnea ,    Pulmonary exam normal breath sounds clear to auscultation       Cardiovascular Exercise Tolerance: Poor hypertension, On Medications +CHF  negative cardio ROS   Rhythm:regular Rate:Normal     Neuro/Psych negative neurological ROS  negative psych ROS   GI/Hepatic negative GI ROS, Neg liver ROS, (+) Hepatitis -  Endo/Other  negative endocrine ROSdiabetes  Renal/GU      Musculoskeletal   Abdominal   Peds  Hematology negative hematology ROS (+)   Anesthesia Other Findings Past Medical History: No date: Ascites No date: Asthma No date: BPH (benign prostatic hyperplasia) No date: CHF (congestive heart failure) (HCC) No date: Chronic diastolic (congestive) heart failure (HCC) No date: Dyspnea No date: Hyperlipidemia No date: Liver cirrhosis secondary to NASH (nonalcoholic  steatohepatitis) (HCC) No date: OSA (obstructive sleep apnea) No date: Pericardial effusion No date: Reactive airway disease No date: Restless leg No date: Weight loss  Past Surgical History: 2001: BACK SURGERY     Comment:  cervical herniation; metal  No date: CATARACT EXTRACTION W/ INTRAOCULAR LENS  IMPLANT, BILATERAL;  Bilateral 01/28/2017: COLONOSCOPY WITH PROPOFOL; N/A     Comment:  Procedure: COLONOSCOPY WITH PROPOFOL;  Surgeon: Manya Silvas, MD;  Location: [facility] ENDOSCOPY;  Service:               Endoscopy;  Laterality: N/A; No date: ELBOW SURGERY; Right     Comment:  x2 05/25/2019:  ESOPHAGOGASTRODUODENOSCOPY (EGD) WITH PROPOFOL; N/A     Comment:  Procedure: ESOPHAGOGASTRODUODENOSCOPY (EGD) WITH               PROPOFOL;  Surgeon: [place], Benay Pike, MD;  Location:               [facility] ENDOSCOPY;  Service: Gastroenterology;  Laterality:               N/A; No date: EYE SURGERY No date: NECK SURGERY No date: PARACENTESIS     Reproductive/Obstetrics negative OB ROS                            Anesthesia Physical Anesthesia Plan  ASA: III  Anesthesia Plan: MAC   Post-op Pain Management:    Induction: Intravenous  PONV Risk Score and Plan: Treatment may vary due to age or medical condition and TIVA  Airway Management Planned: Nasal Cannula and Natural Airway  Additional Equipment:   Intra-op Plan:   Post-operative Plan:   Informed Consent: I have reviewed the patients History and Physical, chart, labs and discussed the procedure including the risks, benefits and alternatives for the proposed anesthesia with the patient or authorized representative who has indicated his/her understanding and acceptance.     Dental Advisory Given  Plan Discussed with: CRNA  Anesthesia Plan Comments:        Anesthesia Quick Evaluation

## 2019-08-12 ENCOUNTER — Encounter: Payer: Self-pay | Admitting: Orthopedic Surgery

## 2019-08-15 LAB — SURGICAL PATHOLOGY

## 2019-08-24 ENCOUNTER — Other Ambulatory Visit: Payer: Self-pay

## 2019-08-24 ENCOUNTER — Ambulatory Visit
Admission: RE | Admit: 2019-08-24 | Discharge: 2019-08-24 | Disposition: A | Discharge status: Home / Self Care | Payer: Medicare Other | Source: Ambulatory Visit | Attending: Gastroenterology | Admitting: Gastroenterology

## 2019-08-24 DIAGNOSIS — K7460 Unspecified cirrhosis of liver: Secondary | ICD-10-CM | POA: Diagnosis not present

## 2019-08-24 DIAGNOSIS — R188 Other ascites: Secondary | ICD-10-CM | POA: Insufficient documentation

## 2019-08-30 ENCOUNTER — Other Ambulatory Visit: Payer: Self-pay | Admitting: Gastroenterology

## 2019-08-30 DIAGNOSIS — K7460 Unspecified cirrhosis of liver: Secondary | ICD-10-CM

## 2019-08-30 DIAGNOSIS — R188 Other ascites: Secondary | ICD-10-CM

## 2019-09-14 ENCOUNTER — Telehealth: Payer: Self-pay | Admitting: Pulmonary Disease

## 2019-09-14 NOTE — Telephone Encounter (Signed)
Called and spoke with the patient regarding his CPAP and the recall.  Advised that he would have to weigh the benefits versus the risks.  Advised that OSA is a chronic condition that requires long-term CPAP therapy that untreated can be dangerous as well.  Also advised that if he does not wear the CPAP and he is renting the machine that the DME company can take it back d/t non use.  Patient uses a blue light machine to clean his machine and states he will continue to use it.  Nothing further needed.

## 2020-02-21 ENCOUNTER — Ambulatory Visit
Admission: RE | Admit: 2020-02-21 | Discharge: 2020-02-21 | Disposition: A | Discharge status: Home / Self Care | Payer: Medicare Other | Source: Ambulatory Visit | Attending: Gastroenterology | Admitting: Gastroenterology

## 2020-02-21 ENCOUNTER — Other Ambulatory Visit: Payer: Self-pay

## 2020-02-21 DIAGNOSIS — R188 Other ascites: Secondary | ICD-10-CM | POA: Insufficient documentation

## 2020-02-21 DIAGNOSIS — K7460 Unspecified cirrhosis of liver: Secondary | ICD-10-CM | POA: Diagnosis not present

## 2020-05-29 ENCOUNTER — Emergency Department: Payer: Medicare Other

## 2020-05-29 ENCOUNTER — Emergency Department
Admission: EM | Admit: 2020-05-29 | Discharge: 2020-05-29 | Disposition: A | Discharge status: Home / Self Care | Payer: Medicare Other | Attending: Emergency Medicine | Admitting: Emergency Medicine

## 2020-05-29 ENCOUNTER — Other Ambulatory Visit: Payer: Self-pay

## 2020-05-29 ENCOUNTER — Encounter: Payer: Self-pay | Admitting: Emergency Medicine

## 2020-05-29 DIAGNOSIS — S0990XA Unspecified injury of head, initial encounter: Secondary | ICD-10-CM

## 2020-05-29 DIAGNOSIS — S41111A Laceration without foreign body of right upper arm, initial encounter: Secondary | ICD-10-CM | POA: Insufficient documentation

## 2020-05-29 DIAGNOSIS — Z23 Encounter for immunization: Secondary | ICD-10-CM | POA: Insufficient documentation

## 2020-05-29 DIAGNOSIS — Z7952 Long term (current) use of systemic steroids: Secondary | ICD-10-CM | POA: Insufficient documentation

## 2020-05-29 DIAGNOSIS — R188 Other ascites: Secondary | ICD-10-CM | POA: Insufficient documentation

## 2020-05-29 DIAGNOSIS — J45909 Unspecified asthma, uncomplicated: Secondary | ICD-10-CM | POA: Insufficient documentation

## 2020-05-29 DIAGNOSIS — W01198A Fall on same level from slipping, tripping and stumbling with subsequent striking against other object, initial encounter: Secondary | ICD-10-CM | POA: Insufficient documentation

## 2020-05-29 DIAGNOSIS — S4991XA Unspecified injury of right shoulder and upper arm, initial encounter: Secondary | ICD-10-CM | POA: Diagnosis present

## 2020-05-29 DIAGNOSIS — I5032 Chronic diastolic (congestive) heart failure: Secondary | ICD-10-CM | POA: Insufficient documentation

## 2020-05-29 DIAGNOSIS — W19XXXA Unspecified fall, initial encounter: Secondary | ICD-10-CM

## 2020-05-29 DIAGNOSIS — T148XXA Other injury of unspecified body region, initial encounter: Secondary | ICD-10-CM

## 2020-05-29 LAB — CBC WITH DIFFERENTIAL/PLATELET
Abs Immature Granulocytes: 0.03 10*3/uL (ref 0.00–0.07)
Basophils Absolute: 0.1 10*3/uL (ref 0.0–0.1)
Basophils Relative: 1 %
Eosinophils Absolute: 0.1 10*3/uL (ref 0.0–0.5)
Eosinophils Relative: 2 %
HCT: 42.8 % (ref 39.0–52.0)
Hemoglobin: 14.7 g/dL (ref 13.0–17.0)
Immature Granulocytes: 1 %
Lymphocytes Relative: 16 %
Lymphs Abs: 0.8 10*3/uL (ref 0.7–4.0)
MCH: 34.3 pg — ABNORMAL HIGH (ref 26.0–34.0)
MCHC: 34.3 g/dL (ref 30.0–36.0)
MCV: 100 fL (ref 80.0–100.0)
Monocytes Absolute: 0.8 10*3/uL (ref 0.1–1.0)
Monocytes Relative: 16 %
Neutro Abs: 3.3 10*3/uL (ref 1.7–7.7)
Neutrophils Relative %: 64 %
Platelets: 152 10*3/uL (ref 150–400)
RBC: 4.28 MIL/uL (ref 4.22–5.81)
RDW: 15 % (ref 11.5–15.5)
WBC: 5 10*3/uL (ref 4.0–10.5)
nRBC: 0 % (ref 0.0–0.2)

## 2020-05-29 LAB — COMPREHENSIVE METABOLIC PANEL
ALT: 15 U/L (ref 0–44)
AST: 29 U/L (ref 15–41)
Albumin: 4.3 g/dL (ref 3.5–5.0)
Alkaline Phosphatase: 83 U/L (ref 38–126)
Anion gap: 11 (ref 5–15)
BUN: 20 mg/dL (ref 8–23)
CO2: 24 mmol/L (ref 22–32)
Calcium: 9.1 mg/dL (ref 8.9–10.3)
Chloride: 96 mmol/L — ABNORMAL LOW (ref 98–111)
Creatinine, Ser: 0.93 mg/dL (ref 0.61–1.24)
GFR, Estimated: >60 mL/min (ref >60)
Glucose, Bld: 109 mg/dL — ABNORMAL HIGH (ref 70–99)
Potassium: 4.6 mmol/L (ref 3.5–5.1)
Sodium: 131 mmol/L — ABNORMAL LOW (ref 135–145)
Total Bilirubin: 2.1 mg/dL — ABNORMAL HIGH (ref 0.3–1.2)
Total Protein: 7.4 g/dL (ref 6.5–8.1)

## 2020-05-29 LAB — BRAIN NATRIURETIC PEPTIDE: B Natriuretic Peptide: 191.8 pg/mL — ABNORMAL HIGH (ref 0.0–100.0)

## 2020-05-29 LAB — TROPONIN I (HIGH SENSITIVITY)
Troponin I (High Sensitivity): 3 ng/L (ref <18)
Troponin I (High Sensitivity): 3 ng/L (ref <18)

## 2020-05-29 MED ORDER — TETANUS-DIPHTH-ACELL PERTUSSIS 5-2.5-18.5 LF-MCG/0.5 IM SUSY
0.5000 mL | PREFILLED_SYRINGE | Freq: Once | INTRAMUSCULAR | Status: AC
  Administered 2020-05-29: 0.5 mL via INTRAMUSCULAR
  Filled 2020-05-29: qty 0.5

## 2020-05-29 MED ORDER — LIDOCAINE-EPINEPHRINE-TETRACAINE (LET) TOPICAL GEL
3.0000 mL | Freq: Once | TOPICAL | Status: AC
  Administered 2020-05-29: 3 mL via TOPICAL
  Filled 2020-05-29: qty 3

## 2020-05-29 MED ORDER — ONDANSETRON HCL 4 MG/2ML IJ SOLN
4.0000 mg | Freq: Once | INTRAMUSCULAR | Status: AC
  Administered 2020-05-29: 4 mg via INTRAVENOUS
  Filled 2020-05-29: qty 2

## 2020-05-29 MED ORDER — MORPHINE SULFATE (PF) 4 MG/ML IV SOLN
4.0000 mg | Freq: Once | INTRAVENOUS | Status: AC
  Administered 2020-05-29: 4 mg via INTRAVENOUS
  Filled 2020-05-29: qty 1

## 2020-05-29 NOTE — ED Provider Notes (Signed)
[facility] Emergency Department Provider Note  ____________________________________________   Event Date/Time   First MD Initiated Contact with Patient 05/29/20 1147     (approximate)  I have reviewed the triage vital signs and the nursing notes.   HISTORY  Chief Complaint Fall    HPI Gordon Cameron is a 86 y.o. male presents emergency department via EMS for a fall.  Patient states when he tried to stand up from the recliner this morning his legs gave way and he fell landing on his right side.  Hit his face and right arm.  No chest pain or shortness of breath.  Patient states his abdomen has been more swollen over the past 3 months increasing in the past week.  States he has had several paracentesis of the abdomen.  His GI specialist is from [facility].  He denies any fever or chills.      Past Medical History:  Diagnosis Date  . Ascites   . Asthma   . BPH (benign prostatic hyperplasia)   . CHF (congestive heart failure) ([place])   . Chronic diastolic (congestive) heart failure ([place])   . Dyspnea   . Hyperlipidemia   . Liver cirrhosis secondary to NASH (nonalcoholic steatohepatitis) ([place])   . OSA (obstructive sleep apnea)   . Pericardial effusion   . Reactive airway disease   . Restless leg   . Weight loss     Patient Active Problem List   Diagnosis Date Noted  . Edema 07/14/2017  . Dyspnea 06/29/2017  . OSA (obstructive sleep apnea) 06/02/2011    Past Surgical History:  Procedure Laterality Date  . BACK SURGERY  2001   cervical herniation; metal   . CATARACT EXTRACTION W/ INTRAOCULAR LENS  IMPLANT, BILATERAL Bilateral   . COLONOSCOPY WITH PROPOFOL N/A 01/28/2017   Procedure: COLONOSCOPY WITH PROPOFOL;  Surgeon: Manya Silvas, MD;  Location: [facility] ENDOSCOPY;  Service: Endoscopy;  Laterality: N/A;  . ELBOW SURGERY Right    x2  . ESOPHAGOGASTRODUODENOSCOPY (EGD) WITH PROPOFOL N/A 05/25/2019   Procedure: ESOPHAGOGASTRODUODENOSCOPY  (EGD) WITH PROPOFOL;  Surgeon: Toledo, Benay Pike, MD;  Location: [facility] ENDOSCOPY;  Service: Gastroenterology;  Laterality: N/A;  . EYE SURGERY    . KYPHOPLASTY N/A 08/11/2019   Procedure: L2 KYPHOPLASTY;  Surgeon: Hessie Knows, MD;  Location: [facility] ORS;  Service: Orthopedics;  Laterality: N/A;  . NECK SURGERY    . PARACENTESIS      Prior to Admission medications   Medication Sig Start Date End Date Taking? Authorizing Provider  albuterol (VENTOLIN HFA) 108 (90 Base) MCG/ACT inhaler Inhale 2 puffs into the lungs every 6 (six) hours as needed for wheezing or shortness of breath. 08/19/17   [name], MD  finasteride (PROSCAR) 5 MG tablet Take 5 mg by mouth at bedtime. Once a day 05/25/11   [provider]  fluticasone (FLONASE) 50 MCG/ACT nasal spray Place 2 sprays into both nostrils at bedtime.     [provider]  furosemide (LASIX) 80 MG tablet Take 160 mg by mouth daily.  06/29/17   [provider]  Magnesium Cl-Calcium Carbonate (SLOW-MAG PO) Take 2 tablets by mouth daily.    [provider]  spironolactone (ALDACTONE) 100 MG tablet Take 100 mg by mouth daily.     [provider]  traMADol (ULTRAM) 50 MG tablet Take 50 mg by mouth every 6 (six) hours as needed (pain).    [provider]  zinc gluconate 50 MG tablet Take 50  mg by mouth daily.    [provider]    Allergies Patient has no known allergies.  Family History  Problem Relation Age of Onset  . Colon cancer Maternal Grandmother   . Colon cancer Brother   . Colon cancer Brother   . Stroke Father     Social History Social History   Tobacco Use  . Smoking status: Never Smoker  . Smokeless tobacco: Never Used  Vaping Use  . Vaping Use: Never used  Substance Use Topics  . Alcohol use: No    Alcohol/week: 0.0 standard drinks  . Drug use: No    Review of Systems  Constitutional: No fever/chills Eyes: No visual changes. ENT: No sore throat. Respiratory:  Denies cough Cardiovascular: Denies chest pain Gastrointestinal: Positive abdominal pain Genitourinary: Negative for dysuria. Musculoskeletal: Negative for back pain. Skin: Negative for rash. Psychiatric: no mood changes,     ____________________________________________   PHYSICAL EXAM:  VITAL SIGNS: ED Triage Vitals  Enc Vitals Group     BP 05/29/20 1112 98/75     Pulse Rate 05/29/20 1111 73     Resp 05/29/20 1111 20     Temp 05/29/20 1112 97.6 F (36.4 C)     Temp Source 05/29/20 1112 Axillary     SpO2 05/29/20 1105 90 %     Weight 05/29/20 1108 164 lb (74.4 kg)     Height 05/29/20 1108 5' 8"  (1.727 m)     Head Circumference --      Peak Flow --      Pain Score 05/29/20 1108 0     Pain Loc --      Pain Edu? --      Excl. in [place]? --     Constitutional: Alert and oriented. Well appearing and in no acute distress. Eyes: Conjunctivae are normal.  Head: Bruising noted to the right side of the face Nose: No congestion/rhinnorhea. Mouth/Throat: Mucous membranes are moist.   Neck:  supple no lymphadenopathy noted Cardiovascular: Normal rate, regular rhythm. Heart sounds are normal Respiratory: Normal respiratory effort.  No retractions, lungs c t a  Abd: Distended, hard, tender bs normal all 4 quad GU: deferred Musculoskeletal: FROM all extremities, warm and well perfused, large skin tears noted along the right arm from the humerus to the forearm Neurologic:  Normal speech and language.  Skin:  Skin is warm, dry. No rash noted. Psychiatric: Mood and affect are normal. Speech and behavior are normal.  ____________________________________________   LABS (all labs ordered are listed, but only abnormal results are displayed)  Labs Reviewed  COMPREHENSIVE METABOLIC PANEL - Abnormal; Notable for the following components:      Result Value   Sodium 131 (*)    Chloride 96 (*)    Glucose, Bld 109 (*)    Total Bilirubin 2.1 (*)    All other components within normal limits   BRAIN NATRIURETIC PEPTIDE - Abnormal; Notable for the following components:   B Natriuretic Peptide 191.8 (*)    All other components within normal limits  CBC WITH DIFFERENTIAL/PLATELET - Abnormal; Notable for the following components:   MCH 34.3 (*)    All other components within normal limits  TROPONIN I (HIGH SENSITIVITY)  TROPONIN I (HIGH SENSITIVITY)   ____________________________________________   ____________________________________________  RADIOLOGY  CT of the head, maxillofacial, C-spine CT abdomen/pelvis due to abdominal distention Chest x-ray  ____________________________________________   PROCEDURES  Procedure(s) performed:   Marland KitchenMarland KitchenLaceration Repair  Date/Time: 05/29/2020 4:01 PM Performed by: Caryn Section,  Linden Dolin, PA-C Authorized by: Versie Starks, PA-C   Consent:    Consent obtained:  Verbal   Consent given by:  Patient   Risks, benefits, and alternatives were discussed: yes     Risks discussed:  Infection, pain, poor cosmetic result and poor wound healing Universal protocol:    Procedure explained and questions answered to patient or proxy's satisfaction: yes     Patient identity confirmed:  Verbally with patient Anesthesia:    Anesthesia method:  Topical application   Topical anesthetic:  LET Laceration details:    Location:  Shoulder/arm   Shoulder/arm location:  R upper arm Pre-procedure details:    Preparation:  Patient was prepped and draped in usual sterile fashion Exploration:    Hemostasis achieved with:  LET   Imaging obtained: x-ray     Imaging outcome: foreign body not noted     Wound extent: no foreign bodies/material noted, no muscle damage noted and no underlying fracture noted   Treatment:    Area cleansed with:  Saline   Amount of cleaning:  Standard   Irrigation solution:  Sterile saline   Irrigation method:  Tap   Debridement:  None Skin repair:    Repair method:  Steri-Strips Approximation:    Approximation:  Close Repair  type:    Repair type:  Simple Post-procedure details:    Dressing:  Non-adherent dressing   Procedure completion:  Tolerated      ____________________________________________   INITIAL IMPRESSION / ASSESSMENT AND PLAN / ED COURSE  Pertinent labs & imaging results that were available during my care of the patient were reviewed by me and considered in my medical decision making (see chart for details).   The patient is an 86 year old male presents after fall.  See HPI.  Physical exam shows patient per stable at this time  DDx: Subdural, subarachnoid, skull fracture, maxillofacial fracture, fracture of the right humerus or right forearm, abdominal distention, ascites, CHF  Labs and imaging ordered   Labs are reassuring, sodium slightly low at 131, BNP is at 191.8, CBC is normal, troponin is normal  CT of the head, maxillofacial and C-spine reviewed by me and confirmed by radiology to have no acute abnormalities  CT of the abdomen/pelvis shows a large amount of fluid, cirrhosis,   I did discuss the paracentesis with his regular gastroenterology PA.  He would like to get the paracentesis done today.  But he will be ordering this as an outpatient.    See procedure note for repairs of the skin tears  Tdap was updated.  Do not feel the patient had an MI as both troponins are normal, do not feel that he is in CHF as the BMP is not that elevated he does have a large amount of ascites, CTs have ruled out any major acute abnormality of the head and neck.  I do feel the fall was strictly mechanical  Is discharged in stable condition in the care of his wife.  They are to follow-up with his regular doctor if not improving in 2 to 3 days, return emergency department worsening.  Gordon Cameron was evaluated in Emergency Department on 05/29/2020 for the symptoms described in the history of present illness. He was evaluated in the context of the global COVID-19 pandemic, which necessitated  consideration that the patient might be at risk for infection with the SARS-CoV-2 virus that causes COVID-19. Institutional protocols and algorithms that pertain to the evaluation of patients at risk for COVID-19  are in a state of rapid change based on information released by regulatory bodies including the CDC and federal and state organizations. These policies and algorithms were followed during the patient's care in the ED.    As part of my medical decision making, I reviewed the following data within the [place] History obtained from family, Nursing notes reviewed and incorporated, Labs reviewed , EKG interpreted NSR, Old chart reviewed, Radiograph reviewed , A consult was requested and obtained from this/these consultant(s) Gastroenterology, Notes from prior ED visits and [place] Controlled Substance Database  ____________________________________________   FINAL CLINICAL IMPRESSION(S) / ED DIAGNOSES  Final diagnoses:  Ascites  Fall, initial encounter  Minor head injury, initial encounter  Multiple skin tears      NEW MEDICATIONS STARTED DURING THIS VISIT:  New Prescriptions   No medications on file     Note:  This document was prepared using Dragon voice recognition software and may include unintentional dictation errors.    Versie Starks, PA-C 05/29/20 1611    Naaman Plummer, MD 05/30/20 [phone]

## 2020-05-29 NOTE — Discharge Instructions (Addendum)
Return to the ER if worsening Follow up with your regular doctor if any sign of infection  Keep the area as dry as possible Steri strips will come off by themselves Your tetanus was updated today and is good for 10 years

## 2020-05-29 NOTE — ED Triage Notes (Signed)
Patient arrives from home via [facility] for a fall. Patient states he stood up from recliner when his legs "gave out" and fell on table. Patient hit front of face and right arm. Patient denies blood thinners and denies LOC. Patient AOx4.

## 2020-05-29 NOTE — ED Notes (Signed)
No stick dressing applied to right arm.

## 2020-05-29 NOTE — ED Notes (Signed)
Patient to xray at this time

## 2020-05-29 NOTE — Procedures (Signed)
PROCEDURE SUMMARY:  Successful US guided paracentesis from RLQ.  Yielded 4.3 L of clear amber fluid.  No immediate complications.  Pt tolerated well.   Specimen was not sent for labs.  EBL < 23m  KAscencion DikePA-C 05/29/2020 4:18 PM

## 2020-05-31 ENCOUNTER — Other Ambulatory Visit: Payer: Self-pay | Admitting: Gastroenterology

## 2020-05-31 DIAGNOSIS — R188 Other ascites: Secondary | ICD-10-CM

## 2020-05-31 DIAGNOSIS — K7460 Unspecified cirrhosis of liver: Secondary | ICD-10-CM

## 2020-07-12 ENCOUNTER — Other Ambulatory Visit: Payer: Self-pay | Admitting: Gastroenterology

## 2020-07-12 DIAGNOSIS — K7460 Unspecified cirrhosis of liver: Secondary | ICD-10-CM

## 2020-07-12 DIAGNOSIS — R188 Other ascites: Secondary | ICD-10-CM

## 2020-07-19 ENCOUNTER — Ambulatory Visit
Admission: RE | Admit: 2020-07-19 | Discharge: 2020-07-19 | Disposition: A | Discharge status: Home / Self Care | Payer: Medicare Other | Source: Ambulatory Visit | Attending: Gastroenterology | Admitting: Gastroenterology

## 2020-07-19 ENCOUNTER — Other Ambulatory Visit: Payer: Self-pay

## 2020-07-19 ENCOUNTER — Other Ambulatory Visit
Admission: RE | Admit: 2020-07-19 | Discharge: 2020-07-19 | Disposition: A | Discharge status: Home / Self Care | Payer: Medicare Other | Source: Home / Self Care | Attending: Orthopedic Surgery | Admitting: Orthopedic Surgery

## 2020-07-19 DIAGNOSIS — R188 Other ascites: Secondary | ICD-10-CM | POA: Diagnosis present

## 2020-07-19 DIAGNOSIS — K7460 Unspecified cirrhosis of liver: Secondary | ICD-10-CM | POA: Insufficient documentation

## 2020-07-19 LAB — BODY FLUID CELL COUNT WITH DIFFERENTIAL
Eos, Fluid: 0 %
Lymphs, Fluid: 24 %
Monocyte-Macrophage-Serous Fluid: 70 %
Neutrophil Count, Fluid: 6 %
Total Nucleated Cell Count, Fluid: 882 cu mm

## 2020-07-19 LAB — PROTEIN, PLEURAL OR PERITONEAL FLUID: Total protein, fluid: 3.8 g/dL

## 2020-07-19 LAB — ALBUMIN: Albumin: 4 g/dL (ref 3.5–5.0)

## 2020-07-19 LAB — ALBUMIN, PLEURAL OR PERITONEAL FLUID: Albumin, Fluid: 2.5 g/dL

## 2020-07-19 LAB — SODIUM: Sodium: 129 mmol/L — ABNORMAL LOW (ref 135–145)

## 2020-07-19 MED ORDER — ALBUMIN HUMAN 25 % IV SOLN: 25.0000 g | Freq: Once | INTRAVENOUS | Status: AC

## 2020-07-19 MED ORDER — ALBUMIN HUMAN 25 % IV SOLN
INTRAVENOUS | Status: AC
  Administered 2020-07-19: 25 g via INTRAVENOUS
  Filled 2020-07-19: qty 100

## 2020-07-23 LAB — BODY FLUID CULTURE W GRAM STAIN: Culture: NO GROWTH

## 2020-07-24 LAB — CYTOLOGY - NON PAP

## 2020-10-09 ENCOUNTER — Other Ambulatory Visit
Admission: RE | Admit: 2020-10-09 | Discharge: 2020-10-09 | Disposition: A | Discharge status: Home / Self Care | Payer: Medicare Other | Source: Ambulatory Visit | Attending: Neurology | Admitting: Neurology

## 2020-10-09 DIAGNOSIS — R262 Difficulty in walking, not elsewhere classified: Secondary | ICD-10-CM | POA: Diagnosis present

## 2020-10-09 DIAGNOSIS — R410 Disorientation, unspecified: Secondary | ICD-10-CM | POA: Insufficient documentation

## 2020-10-09 LAB — AMMONIA: Ammonia: 25 umol/L (ref 9–35)

## 2020-10-18 ENCOUNTER — Telehealth: Payer: Self-pay | Admitting: Student

## 2020-10-18 NOTE — Telephone Encounter (Signed)
Spoke with patient's wife Hoyle Sauer, and have scheduled an In-home Palliative Consult for 10/22/20 @ 2:30 PM

## 2020-10-22 ENCOUNTER — Other Ambulatory Visit: Payer: Self-pay

## 2020-10-22 ENCOUNTER — Other Ambulatory Visit: Payer: Medicare Other | Admitting: Student

## 2020-10-22 DIAGNOSIS — K7469 Other cirrhosis of liver: Secondary | ICD-10-CM

## 2020-10-22 DIAGNOSIS — B356 Tinea cruris: Secondary | ICD-10-CM

## 2020-10-22 DIAGNOSIS — R531 Weakness: Secondary | ICD-10-CM

## 2020-10-22 DIAGNOSIS — Z515 Encounter for palliative care: Secondary | ICD-10-CM

## 2020-10-22 NOTE — Progress Notes (Signed)
[facility] Pall[facility] Consult Note Telephone: [phone]  Fax: [phone]   Date of encounter: 10/22/20 2:41 PM PATIENT NAME: Gordon Cameron [address] [place] [address]   [phone] (home)  DOB: 05/17/1934 MRN: [id number] PRIMARY CARE PROVIDER:    Kirk Ruths, MD,  [place] [address] [phone]  REFERRING PROVIDER:   Kirk Ruths, MD [facility] [place],  [place] [address] [phone]  RESPONSIBLE PARTY:    Contact Information     Name Relation Home Work Mobile   Gordon Cameron,Gordon Cameron [phone]          I met face to face with patient and family in the home. Palliative Care was asked to follow this patient by consultation request of  Kirk Ruths, MD to address advance care planning and complex medical decision making. This is the initial visit.                                     ASSESSMENT AND PLAN / RECOMMENDATIONS:   Advance Care Planning/Goals of Care: Goals include to maximize quality of life and symptom management. Patient/health care surrogate gave his/her permission to discuss.Our advance care planning conversation included a discussion about:    The value and importance of advance care planning  Experiences with loved ones who have been seriously ill or have died  Exploration of personal, cultural or spiritual beliefs that might influence medical decisions  Exploration of goals of care in the event of a sudden injury or illness  Identification and preparation of a healthcare agent  Review and updating or creation of an  advance directive document . Wife has HCPOA Decision not to resuscitate or to de-escalate disease focused treatments due to poor prognosis.MOST form reviewed and left in the home. Patient states she does not want any ventilation.  CODE STATUS: Full Code   Education  provided on palliative medicine versus hospice services.  Will discuss with hospice medical director regarding eligibility for hospice services due to patient's decompensated liver cirrhosis.    Symptom Management/Plan:  Decompensated liver cirrhosis-patient with encephalopathy. Cognitive and functional decline noted. Continue Xifaxin and lactulose as directed.   Generalized weakness-patient would benefit from additional care/support in the home due to his functional decline. Discussed private caregivers. Wife is going to check with Medicare to see if they will pay for in home caregivers. Continue family support. Will defer to palliative SW for recommendations if patient does not meet criteria for hospice.   Tinea cruris-to groin. Wash and pat dry. Apply Zeasorb powder BID x 14 days.   Follow up Palliative Care Visit: Palliative care will continue to follow for complex medical decision making, advance care planning, and clarification of goals. Return in 4-6 weeks or prn.  I spent 60 minutes providing this consultation. More than 50% of the time in this consultation was spent in counseling and care coordination.   PPS: 40%  HOSPICE ELIGIBILITY/DIAGNOSIS: TBD  Chief Complaint: Palliative initial consult.   HISTORY OF PRESENT ILLNESS:  KESHUN BERRETT is a 86 y.o. year old male  with dx of decompensated hepatic cirrhosis, hepatic encephalopathy, ascites, chronic diastolic congestive heart failure, OSA , RLS, hyperlipidemia,confusion, difficulty ambulating, difficulty with balance. Last Paracentesis 05/29/2020.  Confusion has not improved with Xifaxan as well as lactulose. Patient did  suffer head trauma in May 22, CT evaluation at that time was negative for any acute intracranial processes.  Patient was able to resume driving at the time, then in June patient gait became very forgetful. Patient with recent abnormal EEG on 10/15/20. EEG indicates generalized cerebral dysfunction, possibly due to  metabolic or neuronal disorder, possibility of underlying metabolic encephalopathy of undetermined etiology.  Patient currently resides at home with wife. Patient is noted to have increased confusion since July, functional decline recently. He has had days where he fluctuates, but he has been weaker, requiring more assistance daily now.  Patient able to ambulate short distances, although sometimes he requires 2 persons assist to stand and transfer. Wife states patient can no longer be left by himself, he cannot figure out how to use his walker. Patient with poor to fair appetite, daughter states it is taking patient much longer to eat. He does have notable weight loss. Weight 147 pounds today; was 164 in May. Patient now having bowel and bladder incontinence the past month. No pain, shortness of breath. Constipation reported; receiving lactulose BID and dulcolax chewable every 3 days. Wife reports patient having some redness to his groin. Daughter states they have considered patient and his wife moving to [place] with her when their lease is up due to need for more assistance. HPI and ROS primarily obtained from wife and daughter Gordon Cameron due to patient's impaired cognition. Patient is awake throughout visit. He speaks few words; speech raspy, low in tone. He does answer a few direct questions. Patient did require assist x 2 to come to standing position. He was ambulated short distance into his bed room.    History obtained from review of EMR, discussion with primary team, and interview with family, facility staff/caregiver and/or Gordon Cameron.  I reviewed available labs, medications, imaging, studies and related documents from the EMR.  Records reviewed and summarized above.   ROS  General: NAD EYES: denies vision changes ENMT: denies dysphagia Cardiovascular: denies chest pain, denies DOE Pulmonary: denies cough, denies increased SOB Abdomen: +constipation, endorses incontinence of bowel GU: endorses  incontinence of urine MSK: +weakness Skin: denies rashes or wounds Neurological: denies pain, denies insomnia Psych: Endorses stable mood Heme/lymph/immuno: denies bruises, abnormal bleeding  Physical Exam: Constitutional: NAD General: frail appearing EYES: anicteric sclera, lids intact, no discharge  ENMT: intact hearing, oral mucous membranes moist CV: S1S2, RRR, no LE edema Pulmonary: LCTA, no increased work of breathing, no cough, room air Abdomen: normo-active BS + 4 quadrants, firm and non tender, +ascites GU: deferred MSK: sarcopenia, moves all extremities, ambulatory with assist x 2 Skin: warm and dry, erythema to groin Neuro: generalized weakness, alert and oriented to person Psych: non-anxious affect Hem/lymph/immuno: no widespread bruising CURRENT PROBLEM LIST:  Patient Active Problem List   Diagnosis Date Noted   Edema 07/14/2017   Dyspnea 06/29/2017   OSA (obstructive sleep apnea) 06/02/2011   PAST MEDICAL HISTORY:  Active Ambulatory Problems    Diagnosis Date Noted   OSA (obstructive sleep apnea) 06/02/2011   Dyspnea 06/29/2017   Edema 07/14/2017   Resolved Ambulatory Problems    Diagnosis Date Noted   Periodic limb movements of sleep 06/03/2011   Past Medical History:  Diagnosis Date   Ascites    Asthma    BPH (benign prostatic hyperplasia)    CHF (congestive heart failure) (H[facility])    Chronic diastolic (congestive) heart failure (H[facility])    Hyperlipidemia    Liver cirrhosis secondary to NASH (nonalcoholic steatohepatitis) ([place])  Pericardial effusion    Reactive airway disease    Restless leg    Weight loss    SOCIAL HX:  Social History   Tobacco Use   Smoking status: Never   Smokeless tobacco: Never  Substance Use Topics   Alcohol use: No    Alcohol/week: 0.0 standard drinks   FAMILY HX:  Family History  Problem Relation Age of Onset   Colon cancer Maternal Grandmother    Colon cancer Brother    Colon cancer Brother    Stroke Father        ALLERGIES: No Known Allergies   PERTINENT MEDICATIONS:  Outpatient Encounter Medications as of 10/22/2020  Medication Sig   albuterol (VENTOLIN HFA) 108 (90 Base) MCG/ACT inhaler Inhale 2 puffs into the lungs every 6 (six) hours as needed for wheezing or shortness of breath.   finasteride (PROSCAR) 5 MG tablet Take 5 mg by mouth at bedtime. Once a day   fluticasone (FLONASE) 50 MCG/ACT nasal spray Place 2 sprays into both nostrils at bedtime.    furosemide (LASIX) 80 MG tablet Take 160 mg by mouth daily.    Magnesium Cl-Calcium Carbonate (SLOW-MAG PO) Take 2 tablets by mouth daily.   spironolactone (ALDACTONE) 100 MG tablet Take 100 mg by mouth daily.    traMADol (ULTRAM) 50 MG tablet Take 50 mg by mouth every 6 (six) hours as needed (pain).   zinc gluconate 50 MG tablet Take 50 mg by mouth daily.   No facility-administered encounter medications on file as of 10/22/2020.   Thank you for the opportunity to participate in the care of Gordon Cameron.  The palliative care team will continue to follow. Please call our office at [phone] if we can be of additional assistance.   Ezekiel Slocumb, NP   COVID-19 PATIENT SCREENING TOOL Asked and negative response unless otherwise noted:  Have you had symptoms of covid, tested positive or been in contact with someone with symptoms/positive test in the past 5-10 days? No

## 2020-10-26 ENCOUNTER — Telehealth: Payer: Self-pay | Admitting: Student

## 2020-10-26 NOTE — Telephone Encounter (Signed)
Late entry for 10/25/20: multiple calls to wife regarding hospice eligibility. Wife reports patient with further decline since palliative visit. He does not appear to recognize her. He has worsening weakness, unable to stand. He has lost more weight; 3 pounds in past 2 days. He is sleeping more. Given additional declines, hepatic encephalopathy, patient will be referred for hospice evaluation. WE discussed patient's code status; wife and family have discussed. Wife would like for patient to be managed in the home and to pass naturally, no intubation. Reviewed DNR; she is in agreement.  Dr. Tonette Bihari office notified for hospice order to be faxed over and to see if he will serve as hospice attending.

## 2021-02-06 ENCOUNTER — Telehealth: Payer: Self-pay | Admitting: Pulmonary Disease

## 2021-02-06 NOTE — Telephone Encounter (Signed)
Called and spoke with Janett Billow with [facility] who states that patient is on hopsice and only uses his CPAP at night. He sleeps most of the day. This morning when his wife checked on him he had vomitted in his mask. This makes the second/third time in the last 2 weeks this has happened. They are afraid he's going to aspirate. She stated that the wife told her she was not going to put it on him tonight. She has been advised that Dr. Halford Chessman is working nights and they may not hear anything until tomorrow.   What does VS recommend they do.    Dr. Halford Chessman Please advise

## 2021-02-06 NOTE — Telephone Encounter (Signed)
At this point the risk of his using CPAP outweigh the benefit.  I would advise that he not use CPAP anymore.

## 2021-02-06 NOTE — Telephone Encounter (Signed)
Patient is on hopsice and only uses his CPAP at night. He sleeps most of the day. THis morning when his wife checked on him he had vomitted in his mask. This make the second/third time in the last 2 weeks this has happened. They are afraid he's going to aspirate. What does VS recommend they do. Please advise

## 2021-02-07 NOTE — Telephone Encounter (Signed)
Called and spoke with Janett Billow. She verbalized understanding and will inform the patient's family.   Nothing further needed at time of call.

## 2021-03-20 DEATH — deceased

## 2021-11-20 IMAGING — US US PARACENTESIS
1 series · 5 of 5 positions shown · non-contrast
Comparison: none

INDICATION: 84-year-old male with a history of ascites

[Series 1: us paracentesis · 0.28mm/px · 5 of 5 slices shown]
[im 1/5]
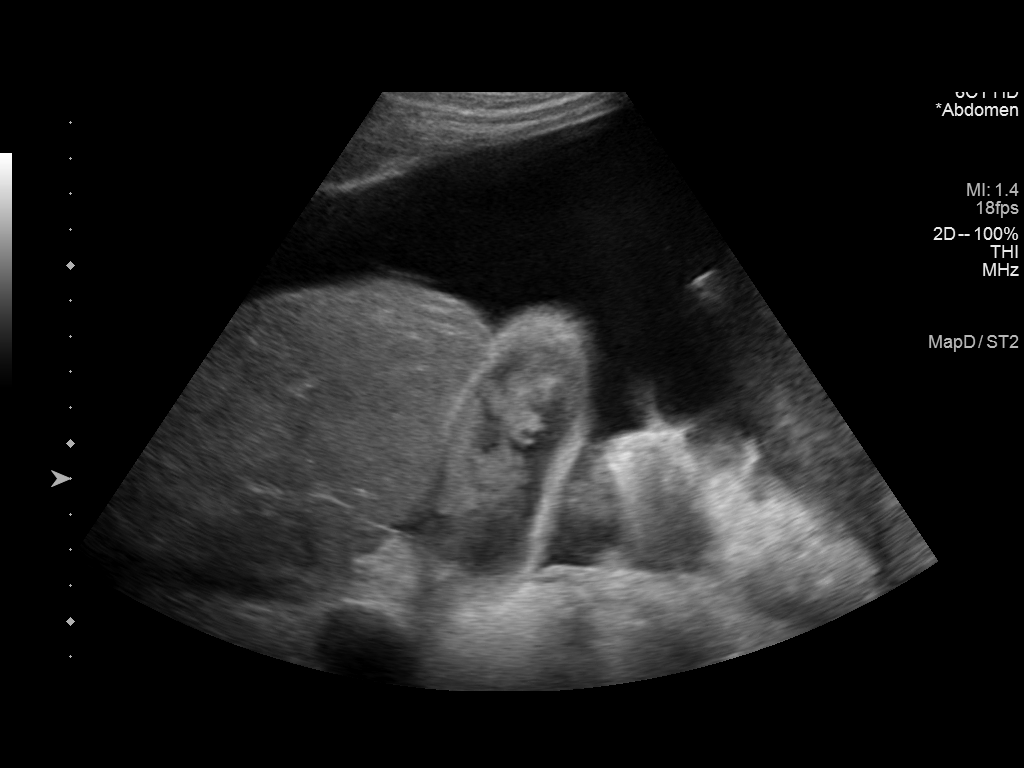
[im 2/5]
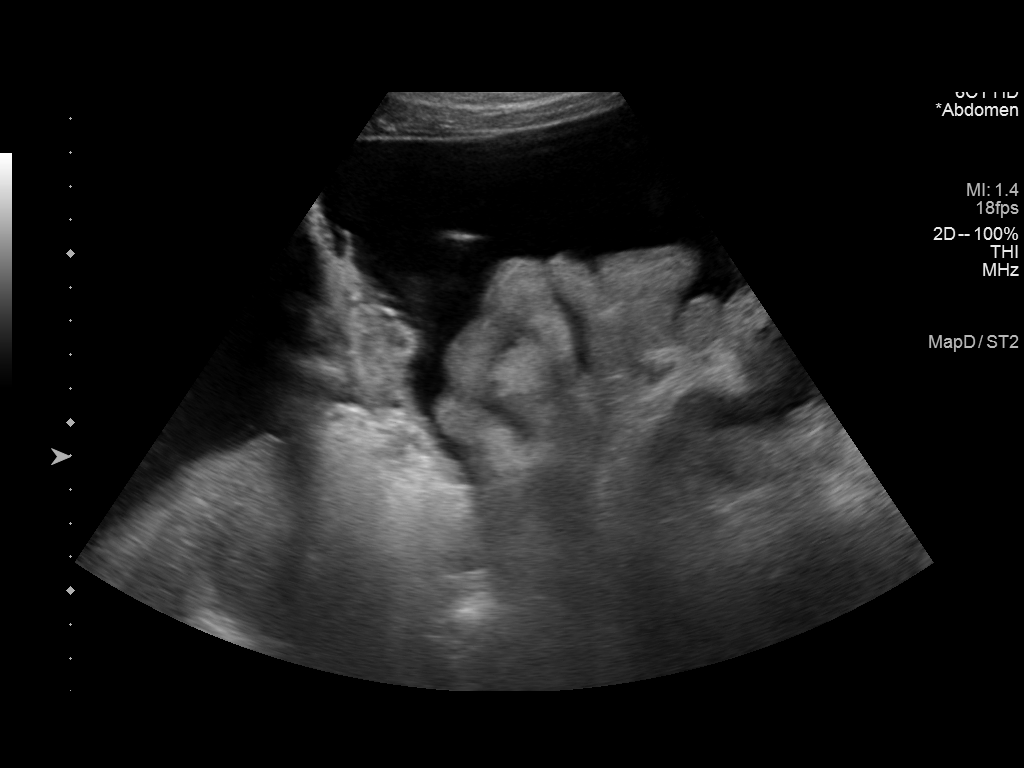
[im 3/5]
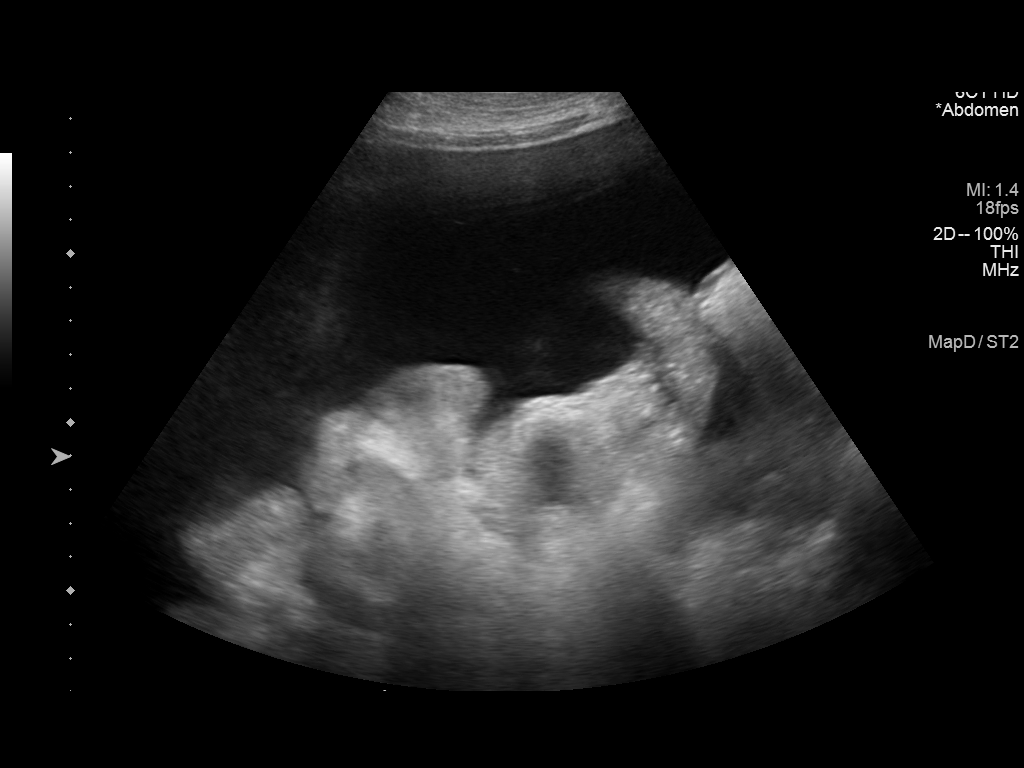
[im 4/5]
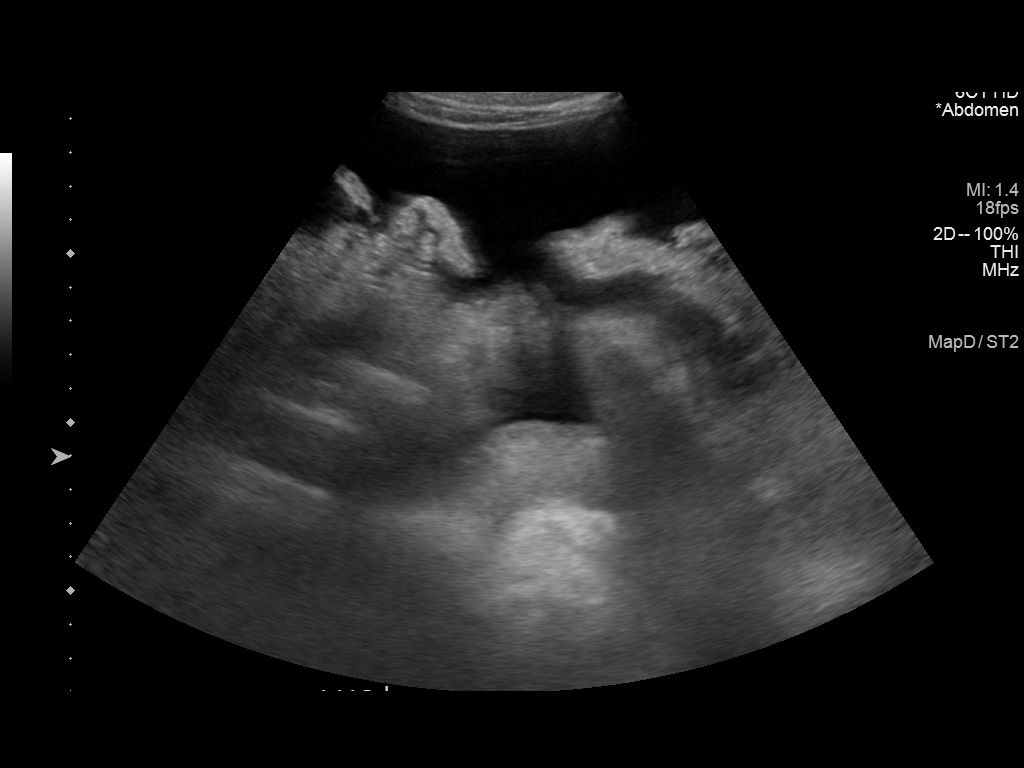
[im 5/5]
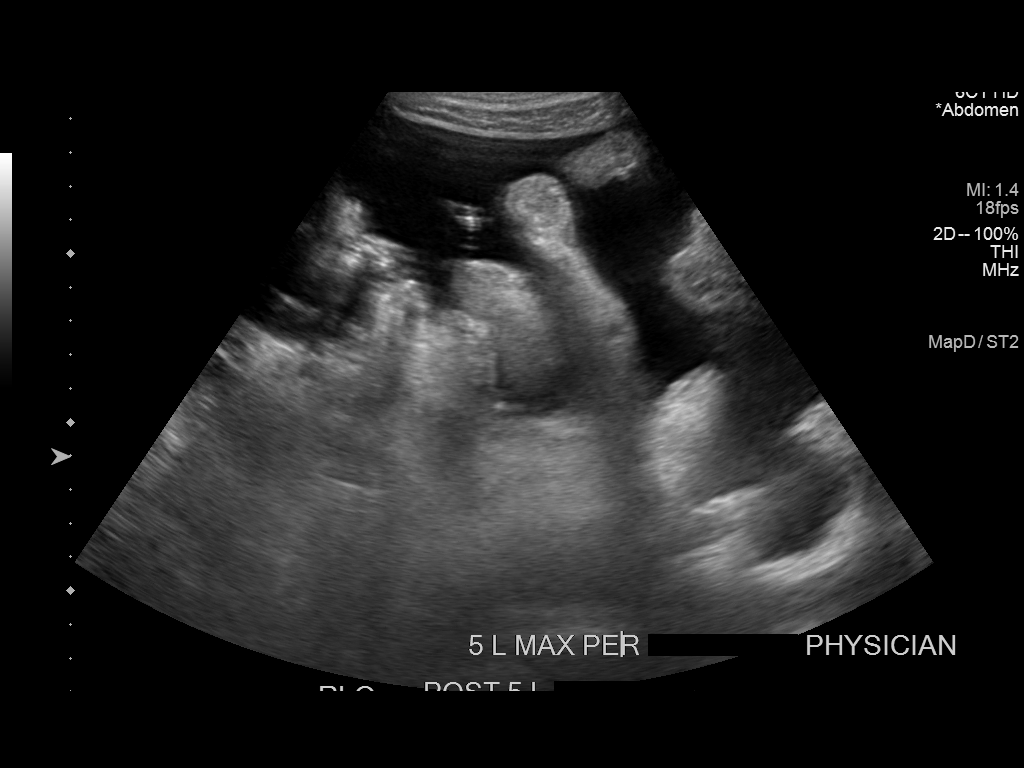

[5 of 5 positions shown; findings below may reference images not displayed]

EXAM:
ULTRASOUND GUIDED  PARACENTESIS

MEDICATIONS:
None.

COMPLICATIONS:
None

PROCEDURE:
Informed written consent was obtained from the patient after a
discussion of the risks, benefits and alternatives to treatment. A
timeout was performed prior to the initiation of the procedure.

Initial ultrasound scanning demonstrates a large amount of ascites
within the right lower abdominal quadrant. The right lower abdomen
was prepped and draped in the usual sterile fashion. 1% lidocaine
was used for local anesthesia.

Following this, a 8 Fr Safe-T-Centesis catheter was introduced. An
ultrasound image was saved for documentation purposes. The
paracentesis was performed. The catheter was removed and a dressing
was applied. The patient tolerated the procedure well without
immediate post procedural complication.
Patient received post-procedure intravenous albumin; see nursing
notes for details.
FINDINGS: A total of approximately 5.0 L of thin yellow fluid was removed.
Samples were sent to the laboratory as requested by the clinical
team.
IMPRESSION: Status post ultrasound-guided paracentesis.

## 2022-01-30 IMAGING — US US PARACENTESIS
1 series · 3 of 3 positions shown · non-contrast
Comparison: none

INDICATION: Patient with history of non alcoholic liver cirrhosis, congestive
heart failure, recurrent ascites. Request to IR for therapeutic
paracentesis.

[Series 1: us paracentesis · 0.25mm/px · 3 of 3 slices shown]
[im 1/3]
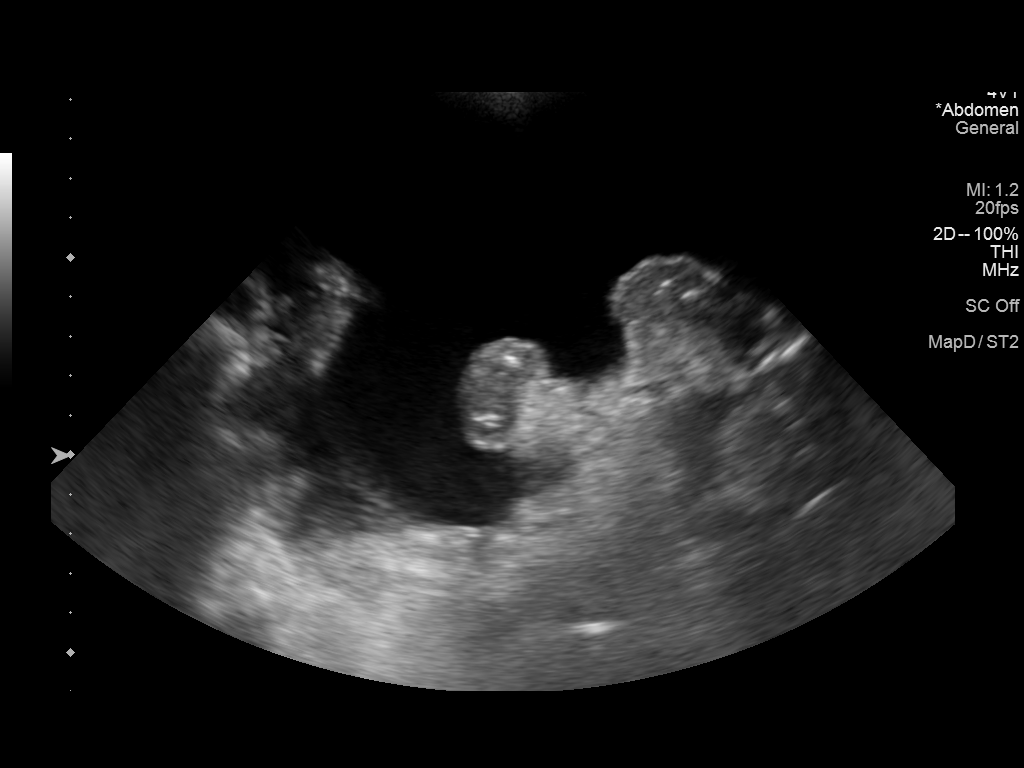
[im 2/3]
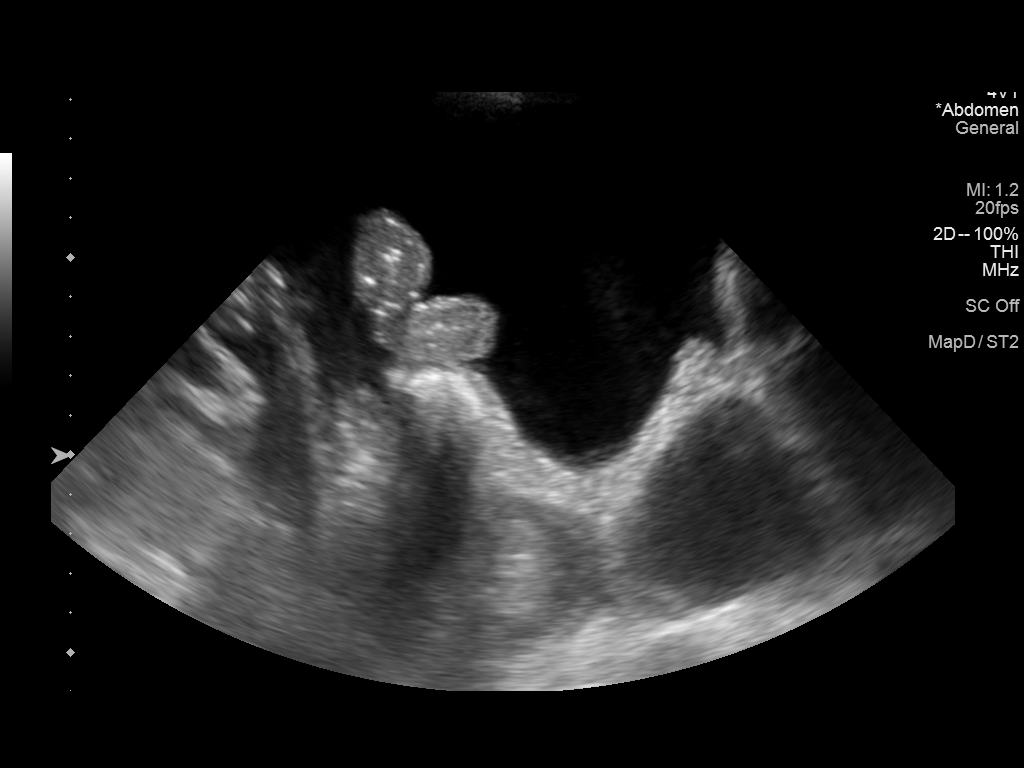
[im 3/3]
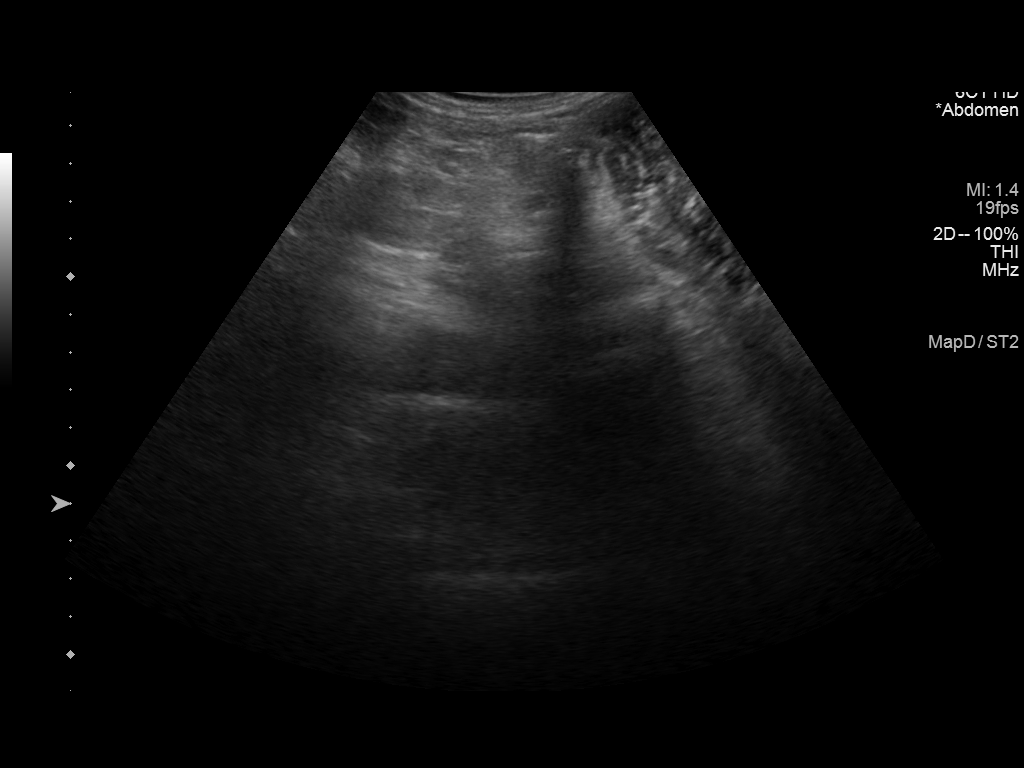

[3 of 3 positions shown; findings below may reference images not displayed]

EXAM:
ULTRASOUND GUIDED THERAPEUTIC PARACENTESIS

MEDICATIONS:
10 mL 1% lidocaine

COMPLICATIONS:
None immediate.

PROCEDURE:
Informed written consent was obtained from the patient after a
discussion of the risks, benefits and alternatives to treatment. A
timeout was performed prior to the initiation of the procedure.

Initial ultrasound scanning demonstrates a large amount of ascites
within the left lower abdominal quadrant. The left lower abdomen was
prepped and draped in the usual sterile fashion. 1% lidocaine was
used for local anesthesia.

Following this, a 6 Fr Safe-T-Centesis catheter was introduced. An
ultrasound image was saved for documentation purposes. The
paracentesis was performed. The catheter was removed and a dressing
was applied. The patient tolerated the procedure well without
immediate post procedural complication.
Patient received post-procedure intravenous albumin; see nursing
notes for details.
FINDINGS: A total of approximately 8.5 L of amber fluid was removed.
IMPRESSION: Successful ultrasound-guided paracentesis yielding 8.5 liters of
peritoneal fluid.

Read by Amazigh, Quirijn

## 2022-02-13 IMAGING — US US PARACENTESIS
1 series · 5 of 5 positions shown · non-contrast
Comparison: none

INDICATION: Patient with history of cirrhosis, CHF, recurrent ascites. Request
made for diagnostic and therapeutic paracentesis.

[Series 2: us paracentesis · 0.26mm/px · 5 of 5 slices shown]
[im 1/5]
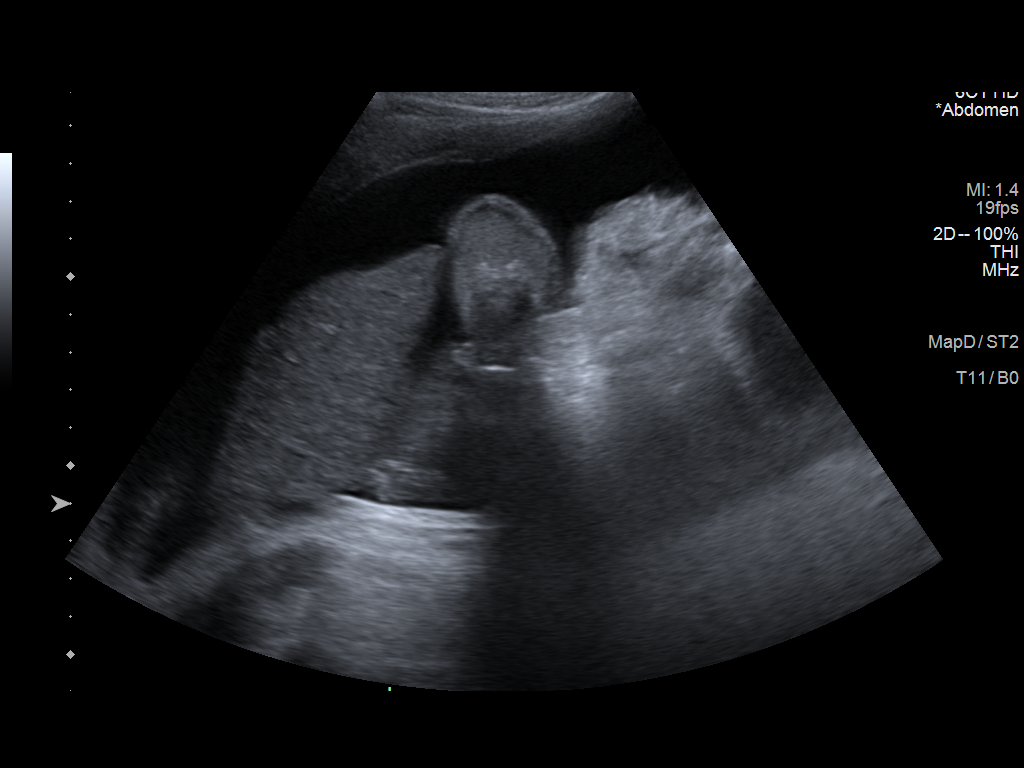
[im 2/5]
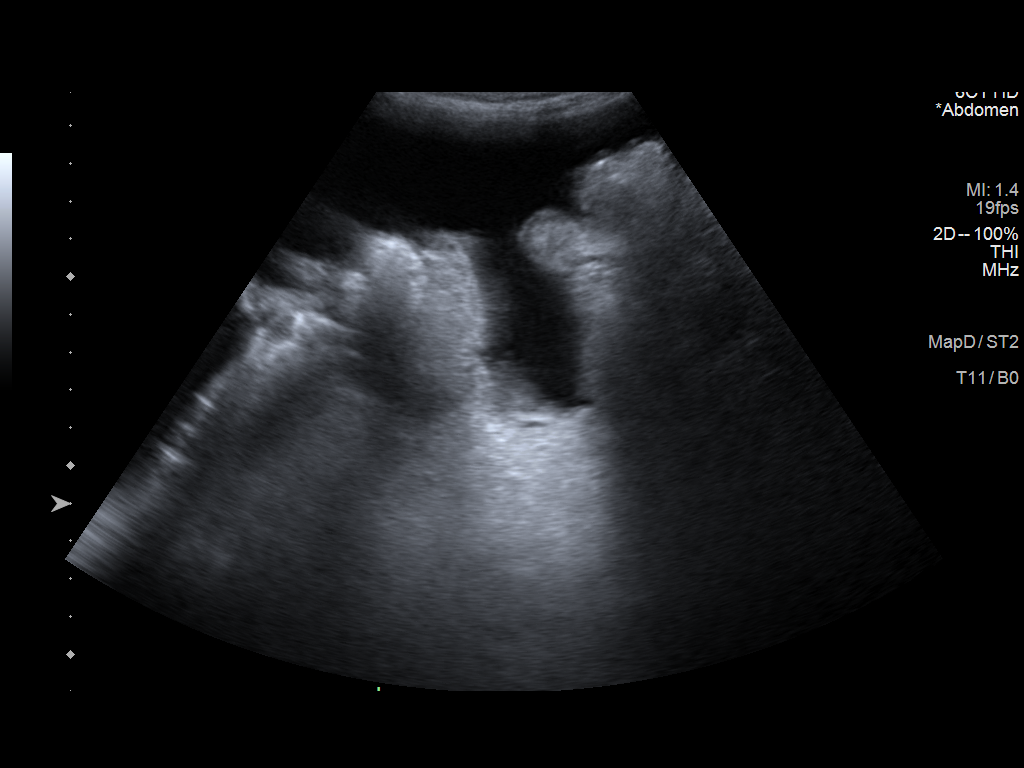
[im 3/5]
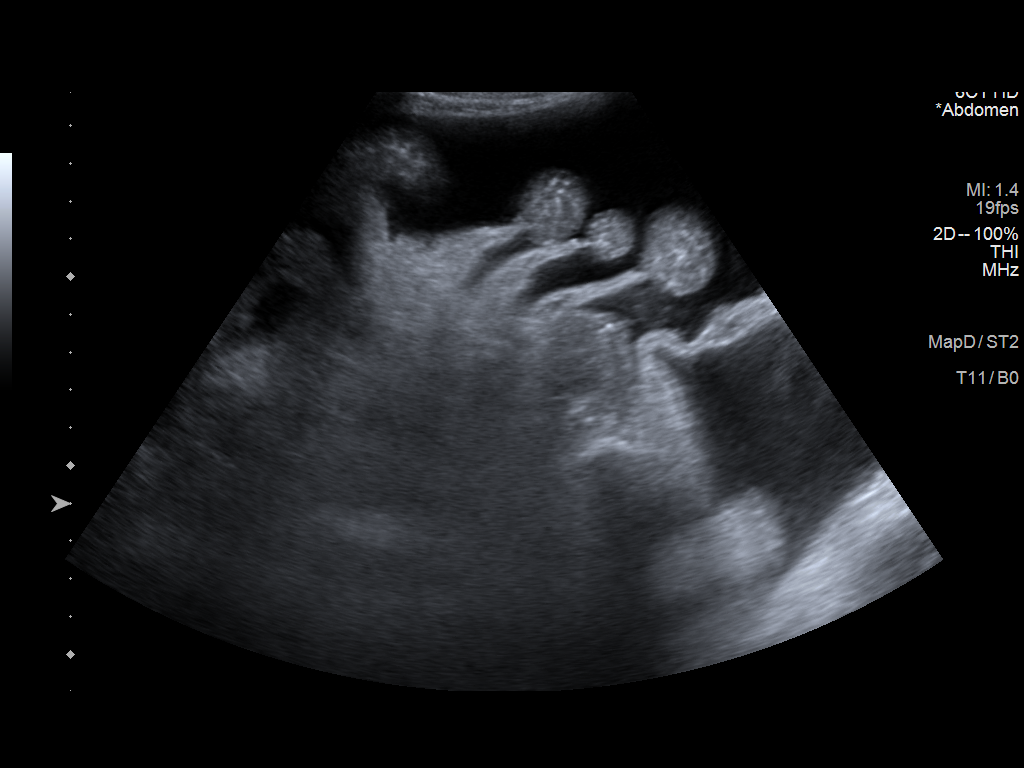
[im 4/5]
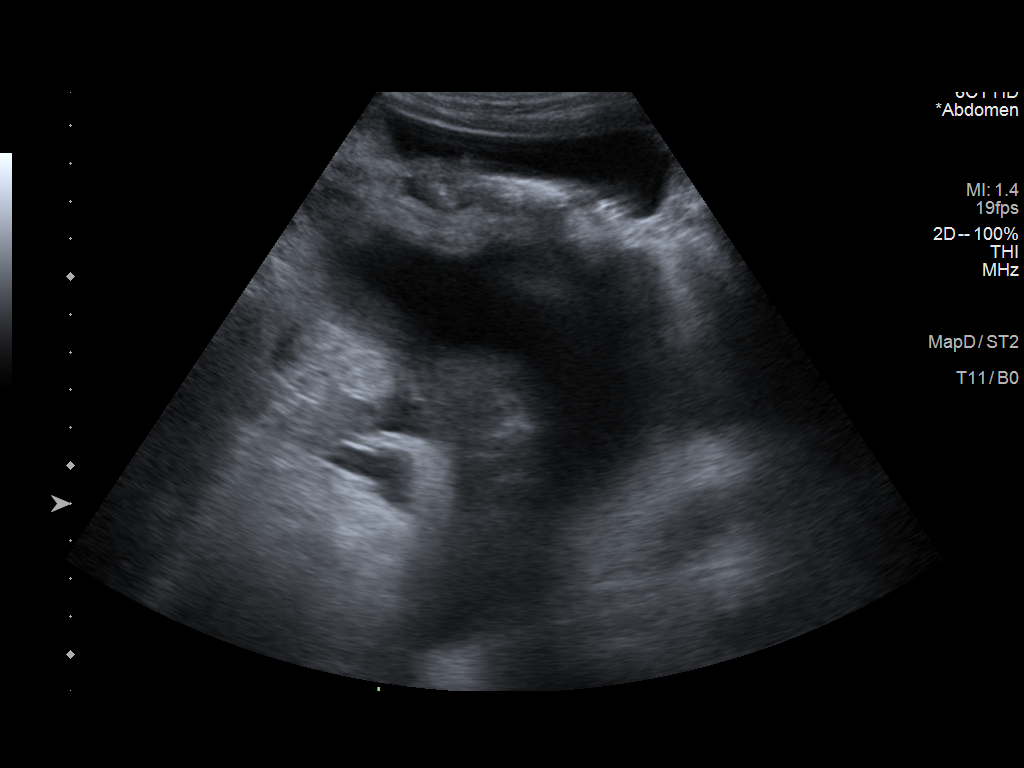
[im 5/5]
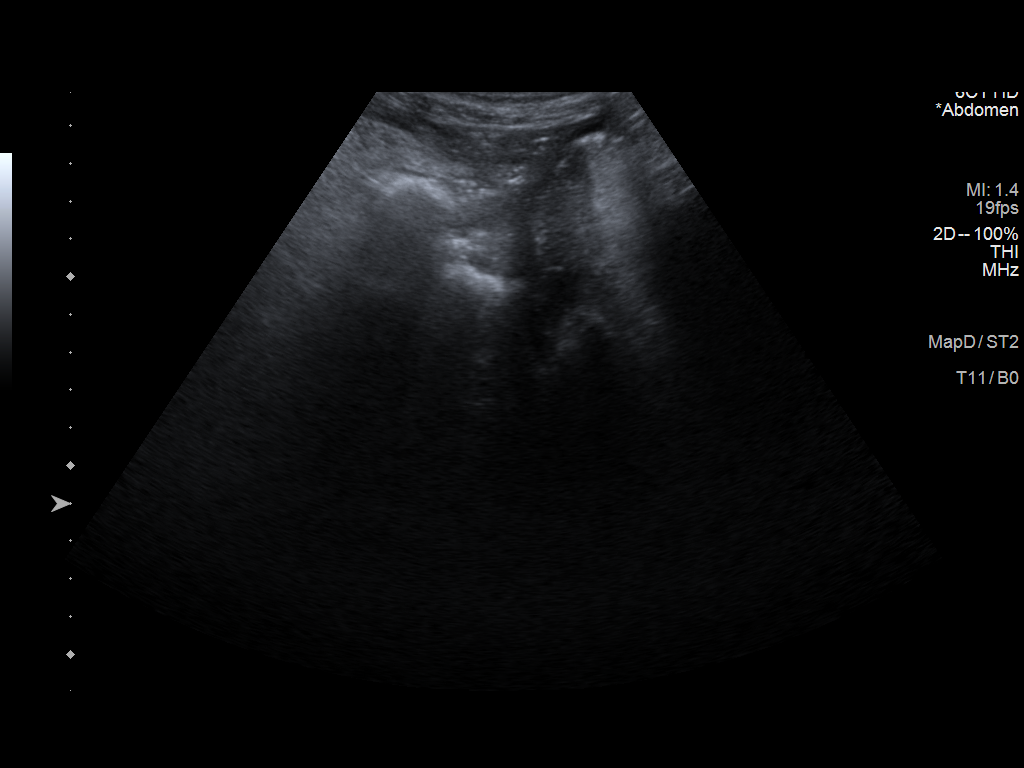

[5 of 5 positions shown; findings below may reference images not displayed]

EXAM:
ULTRASOUND GUIDED DIAGNOSTIC AND THERAPEUTIC PARACENTESIS

MEDICATIONS:
None

COMPLICATIONS:
None immediate.

PROCEDURE:
Informed written consent was obtained from the patient after a
discussion of the risks, benefits and alternatives to treatment. A
timeout was performed prior to the initiation of the procedure.

Initial ultrasound scanning demonstrates a large amount of ascites
within the left lower abdominal quadrant. The left lower abdomen was
prepped and draped in the usual sterile fashion. 1% lidocaine was
used for local anesthesia.

Following this, a 19 gauge, 7-cm, Yueh catheter was introduced. An
ultrasound image was saved for documentation purposes. The
paracentesis was performed. The catheter was removed and a dressing
was applied. The patient tolerated the procedure well without
immediate post procedural complication.
Patient received post-procedure intravenous albumin; see nursing
notes for details.
FINDINGS: A total of approximately 4.6 liters of amber fluid was removed.
Samples were sent to the laboratory as requested by the clinical
team.
IMPRESSION: Successful ultrasound-guided diagnostic and therapeutic paracentesis
yielding 4.6 liters of peritoneal fluid.

## 2022-05-03 IMAGING — US US PARACENTESIS
1 series · 10 of 10 positions shown · non-contrast
Comparison: none

INDICATION: Patient with history of congestive heart failure, cirrhosis,
recurrent ascites. Request to IR for diagnostic and therapeutic
paracentesis.

[Series 1: us paracentesis · 0.26mm/px · 10 of 10 slices shown]
[im 1/10]
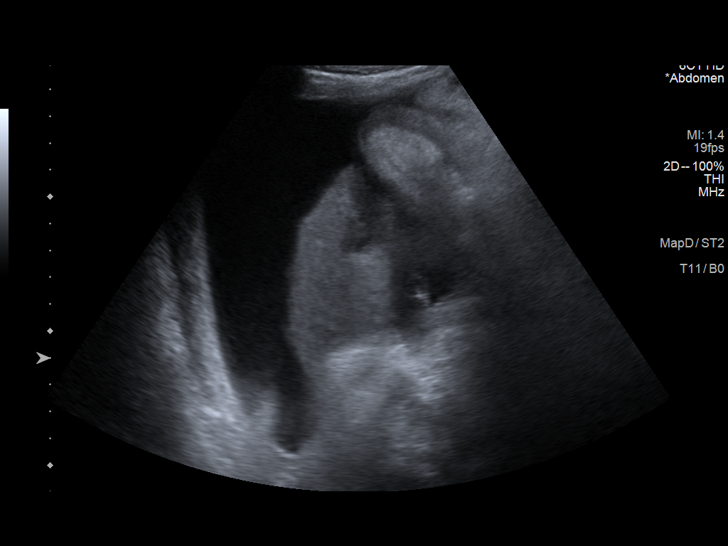
[im 2/10]
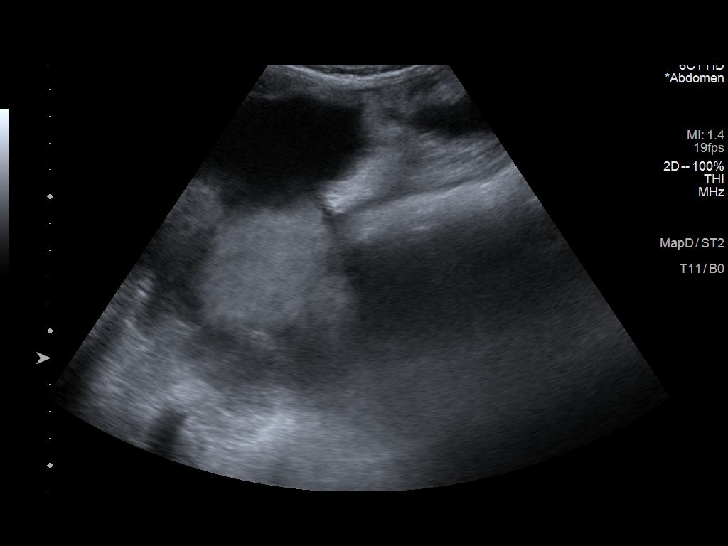
[im 3/10]
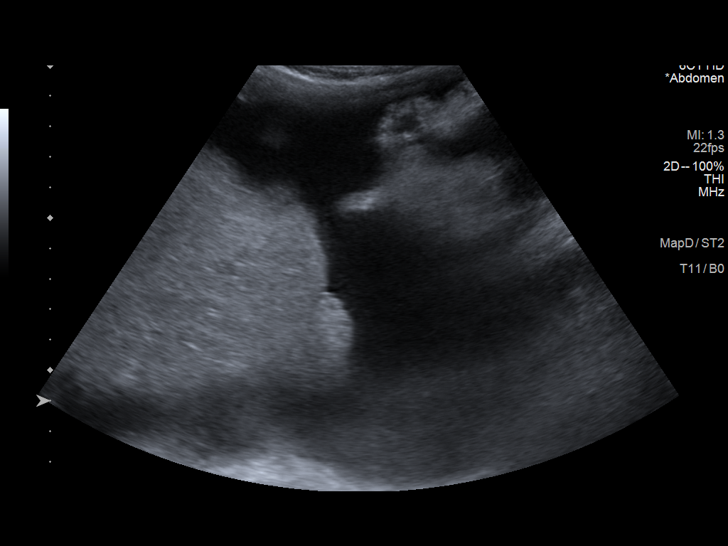
[im 4/10]
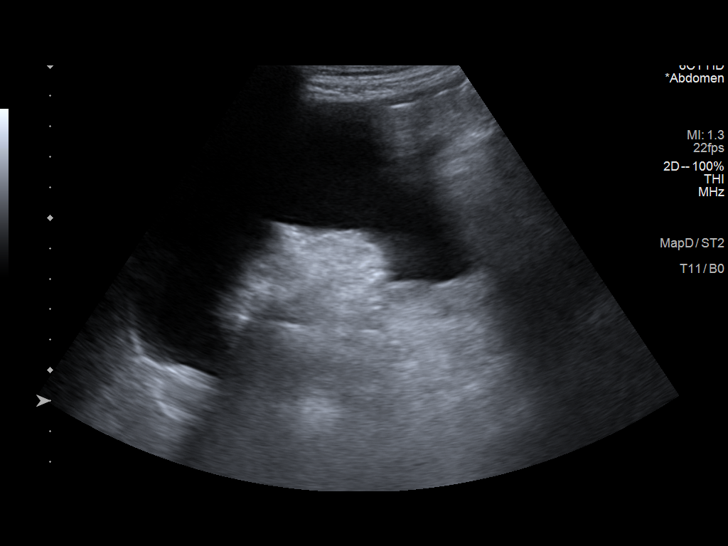
[im 5/10]
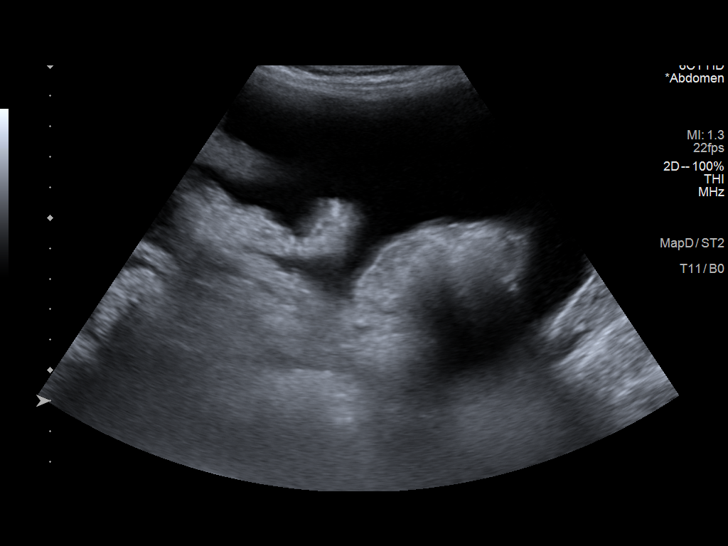
[im 6/10]
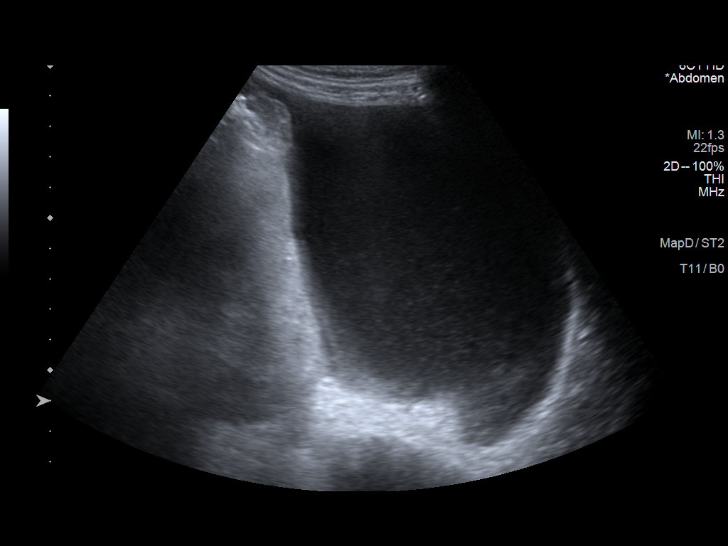
[im 7/10]
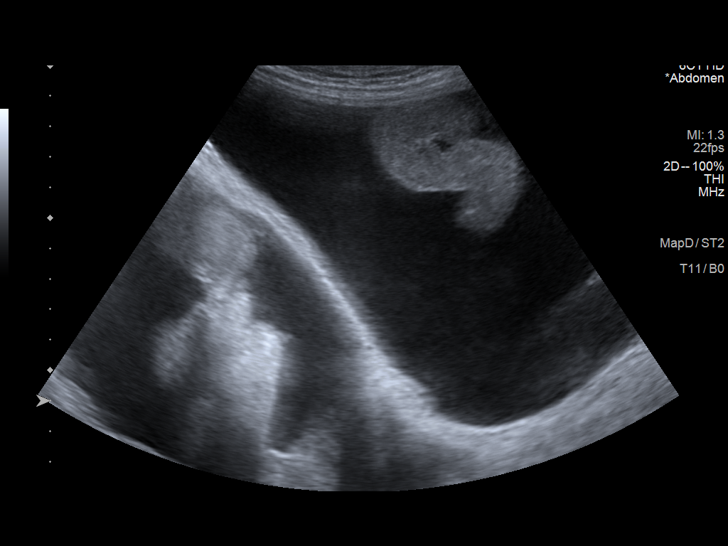
[im 8/10]
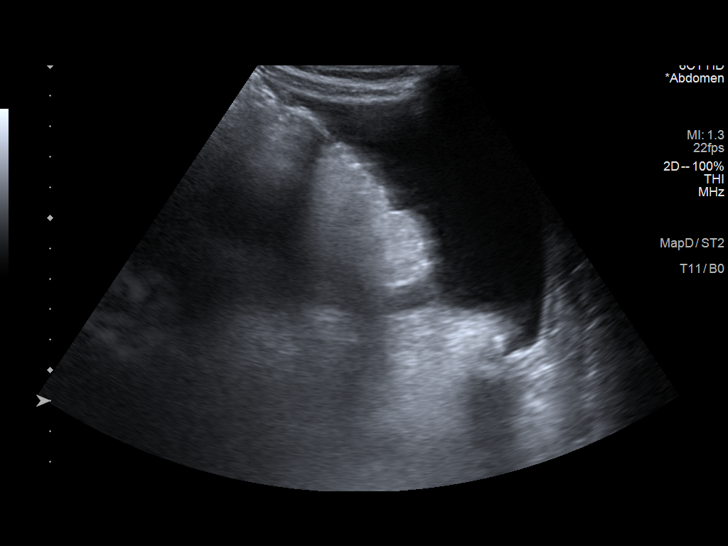
[im 9/10]
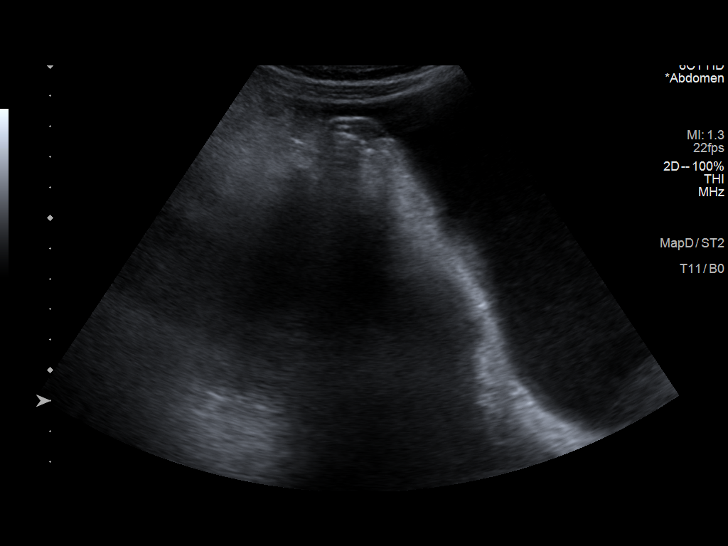
[im 10/10]
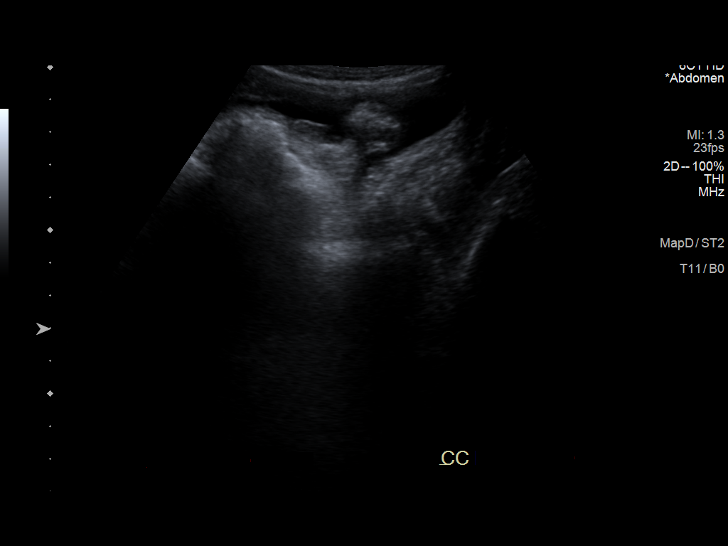

[10 of 10 positions shown; findings below may reference images not displayed]

EXAM:
ULTRASOUND GUIDED DIAGNOSTIC AND THERAPEUTIC PARACENTESIS

MEDICATIONS:
10 mL 1% lidocaine

COMPLICATIONS:
None immediate.

PROCEDURE:
Informed written consent was obtained from the patient after a
discussion of the risks, benefits and alternatives to treatment. A
timeout was performed prior to the initiation of the procedure.

Initial ultrasound scanning demonstrates a large amount of ascites
within the left lower abdominal quadrant. The left lower abdomen was
prepped and draped in the usual sterile fashion. 1% lidocaine was
used for local anesthesia.

Following this, a 6 Fr Safe-T-Centesis catheter was introduced. An
ultrasound image was saved for documentation purposes. The
paracentesis was performed. The catheter was removed and a dressing
was applied. The patient tolerated the procedure well without
immediate post procedural complication.
FINDINGS: A total of approximately 4486 mL of amber fluid was removed. Samples
were sent to the laboratory as requested by the clinical team.
IMPRESSION: Successful ultrasound-guided paracentesis yielding 4486 mL of
peritoneal fluid.

Read by Aro, Gustave
